# Patient Record
Sex: Female | Born: 1995 | Race: White | Hispanic: No | Marital: Married | Smoking: Former smoker
Health system: Southern US, Community
[De-identification: ages and names within clinical notes are randomized; demographics above are authoritative.]

## PROBLEM LIST (undated history)

## (undated) DIAGNOSIS — R519 Headache, unspecified: Secondary | ICD-10-CM

## (undated) DIAGNOSIS — T8859XA Other complications of anesthesia, initial encounter: Secondary | ICD-10-CM

## (undated) DIAGNOSIS — R112 Nausea with vomiting, unspecified: Secondary | ICD-10-CM

## (undated) DIAGNOSIS — Q5128 Other doubling of uterus, other specified: Secondary | ICD-10-CM

## (undated) DIAGNOSIS — Z833 Family history of diabetes mellitus: Secondary | ICD-10-CM

## (undated) DIAGNOSIS — D649 Anemia, unspecified: Secondary | ICD-10-CM

## (undated) DIAGNOSIS — K219 Gastro-esophageal reflux disease without esophagitis: Secondary | ICD-10-CM

## (undated) DIAGNOSIS — Q279 Congenital malformation of peripheral vascular system, unspecified: Secondary | ICD-10-CM

## (undated) DIAGNOSIS — Q512 Other doubling of uterus, unspecified: Principal | ICD-10-CM

## (undated) DIAGNOSIS — T4145XA Adverse effect of unspecified anesthetic, initial encounter: Secondary | ICD-10-CM

## (undated) DIAGNOSIS — Z9889 Other specified postprocedural states: Secondary | ICD-10-CM

## (undated) HISTORY — DX: Other and unspecified doubling of uterus: Q51.28

## (undated) HISTORY — DX: Other doubling of uterus, unspecified: Q51.20

## (undated) HISTORY — DX: Family history of diabetes mellitus: Z83.3

## (undated) HISTORY — DX: Congenital malformation of peripheral vascular system, unspecified: Q27.9

## (undated) HISTORY — PX: CHOLECYSTECTOMY: SHX55

---
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
## 2015-01-17 ENCOUNTER — Ambulatory Visit: Payer: Self-pay | Admitting: Primary Care

## 2015-01-21 ENCOUNTER — Ambulatory Visit: Payer: Self-pay | Admitting: Primary Care

## 2015-01-24 ENCOUNTER — Ambulatory Visit: Payer: Self-pay | Admitting: Primary Care

## 2015-04-27 ENCOUNTER — Telehealth: Payer: Self-pay | Admitting: Primary Care

## 2015-04-27 NOTE — Telephone Encounter (Signed)
Holly Meyer stated that if there is an appointment time 30 min slot that is available then ok to place the patient for both new patient and back pain.

## 2015-04-27 NOTE — Telephone Encounter (Signed)
Patient's mother called to schedule a new patient appointment.  Patient is having back pain.  First available new patient appointment is 05/10/15.  Patient's mother is asking if Anda Kraft can see patient sooner.

## 2015-04-28 ENCOUNTER — Ambulatory Visit (INDEPENDENT_AMBULATORY_CARE_PROVIDER_SITE_OTHER): Payer: 59 | Admitting: Primary Care

## 2015-04-28 ENCOUNTER — Encounter: Payer: Self-pay | Admitting: Primary Care

## 2015-04-28 VITALS — BP 110/76 | HR 80 | Temp 98.1°F | Ht 66.0 in | Wt 149.8 lb

## 2015-04-28 DIAGNOSIS — M549 Dorsalgia, unspecified: Secondary | ICD-10-CM | POA: Insufficient documentation

## 2015-04-28 DIAGNOSIS — M545 Low back pain, unspecified: Secondary | ICD-10-CM

## 2015-04-28 DIAGNOSIS — Z30011 Encounter for initial prescription of contraceptive pills: Secondary | ICD-10-CM

## 2015-04-28 DIAGNOSIS — M62830 Muscle spasm of back: Secondary | ICD-10-CM

## 2015-04-28 MED ORDER — CYCLOBENZAPRINE HCL 5 MG PO TABS: 5.0000 mg | ORAL_TABLET | Freq: Every day | ORAL | Status: DC

## 2015-04-28 MED ORDER — NORGESTIMATE-ETH ESTRADIOL 0.25-35 MG-MCG PO TABS: 1.0000 | ORAL_TABLET | Freq: Every day | ORAL | Status: DC

## 2015-04-28 NOTE — Assessment & Plan Note (Signed)
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 No numbness/tingling. Suspect MSK as she is tender over muscle area to right lower side. Some improvement with ibuprofen. RX today for flexeril at bedtime. Follow up if no improvement.

## 2015-04-28 NOTE — Assessment & Plan Note (Signed)
Managed on Tri-Sprintec. Experiencing headaches, breast pain, and nausea. Has been evaluated by GYN who did not find any abnormalities. Patient is not pregnant. She is requesting switch of BC. Started monophasic sprintec as this may improve symptoms. Periods regular. No extreme cramping. Follow up in 3 months for re-eval.

## 2015-04-28 NOTE — Patient Instructions (Signed)
Engineer, materials of Tri-Sprintec. Start the new pack of regular Sprintec the Sunday after you complete your period.  You may take the Flexeril at bedtime as needed for muscle spasms. Continue ibuprofen as needed. Apply heat to back for pain.  Follow up in 3 months for re-evaluation of birth control.  It was a pleasure to meet you today! Please don't hesitate to call me with any questions. Welcome to [facility]!

## 2015-04-28 NOTE — Progress Notes (Signed)
Pre visit review using our clinic review tool, if applicable. No additional management support is needed unless otherwise documented below in the visit note. 

## 2015-04-28 NOTE — Progress Notes (Signed)
Subjective:    Patient ID: Holly Meyer, female    DOB: 10/11/1996, 19 y.o.   MRN: [id number]  HPI  [name] is a 19 year old female who presents today to establish care and discuss the problems mentioned below. Will obtain old records.  1) Back pain: Started about 6 days ago. She started experiencing some neck stiffness that evening and then woke up the next morning with right lower back pain. She has a history of venous malformation to her spine on a nerve. Denies numbness/tingling. She had a massage 2 days ago with some temporarily relief. The massage therapist mentioned tension to her back. Overall her back pain has improved but still remains. She's been taking ibuprofen with some relief.   2) Birth control: She is requesting to be switched from the tri-sprintec to another birth control due to symptoms of headaches, nausea, and breast pain. Denies heavy cycles, not pregnant, no cramping.  Review of Systems  Constitutional: Negative for unexpected weight change.  HENT: Negative for rhinorrhea.   Respiratory: Negative for cough and shortness of breath.   Cardiovascular: Negative for chest pain.  Gastrointestinal: Negative for diarrhea and constipation.  Genitourinary: Negative for difficulty urinating.       Menstrual cycles regular.  Musculoskeletal: Positive for back pain.  Skin: Negative for rash.  Neurological: Positive for headaches.  Psychiatric/Behavioral:       Denies concerns for anxiety or depression       History reviewed. No pertinent past medical history.  History   Social History  . Marital Status: Single    Spouse Name: N/A  . Number of Children: N/A  . Years of Education: N/A   Occupational History  . Not on file.   Social History Main Topics  . Smoking status: Current Every Day Smoker -- 0.25 packs/day    Types: Cigarettes  . Smokeless tobacco: Not on file  . Alcohol Use: 0.0 oz/week    0 Standard drinks or equivalent per week     Comment: socailly    . Drug Use: Not on file  . Sexual Activity: Not on file   Other Topics Concern  . Not on file   Social History Narrative   Single.   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   Works at [employer].    Enjoys swimming, watching movies.    Past Surgical History  Procedure Laterality Date  . Wisdom tooth extraction  2012    Family History  Problem Relation Age of Onset  . Hypertension Father     Allergies  Allergen Reactions  . Fexofenadine Shortness Of Breath  . Guaifenesin Er Other (See Comments)  . Meloxicam Other (See Comments)    No current outpatient prescriptions on file prior to visit.   No current facility-administered medications on file prior to visit.    BP 110/76 mmHg  Pulse 80  Temp(Src) 98.1 F (36.7 C) (Oral)  Ht 5\' 6"  (1.676 m)  Wt 149 lb 12.8 oz (67.949 kg)  BMI 24.19 kg/m2  SpO2 98%  LMP 04/06/2015    Objective:   Physical Exam  Constitutional: She is oriented to person, place, and time. She appears well-nourished.  HENT:  Head: Normocephalic.  Cardiovascular: Normal rate and regular rhythm.   Pulmonary/Chest: Effort normal and breath sounds normal.  Musculoskeletal: Normal range of motion.  Tenderness to right lower back over muscle region. No spinal tenderness. No radiculopathy. Neg straight  leg raise.  Neurological: She is alert and oriented to person, place, and time.  Skin: Skin is warm and dry.  Psychiatric: She has a normal mood and affect.          Assessment & Plan:

## 2015-05-09 ENCOUNTER — Ambulatory Visit: Payer: Self-pay | Admitting: Primary Care

## 2015-05-10 ENCOUNTER — Ambulatory Visit: Payer: Self-pay | Admitting: Primary Care

## 2015-06-22 ENCOUNTER — Encounter: Payer: Self-pay | Admitting: Internal Medicine

## 2015-06-22 ENCOUNTER — Ambulatory Visit (INDEPENDENT_AMBULATORY_CARE_PROVIDER_SITE_OTHER): Payer: 59 | Admitting: Internal Medicine

## 2015-06-22 VITALS — BP 120/78 | HR 91 | Temp 99.0°F | Wt 150.0 lb

## 2015-06-22 DIAGNOSIS — R102 Pelvic and perineal pain: Secondary | ICD-10-CM

## 2015-06-22 DIAGNOSIS — R519 Headache, unspecified: Secondary | ICD-10-CM

## 2015-06-22 DIAGNOSIS — N9489 Other specified conditions associated with female genital organs and menstrual cycle: Secondary | ICD-10-CM

## 2015-06-22 DIAGNOSIS — S90464A Insect bite (nonvenomous), right lesser toe(s), initial encounter: Secondary | ICD-10-CM | POA: Diagnosis not present

## 2015-06-22 DIAGNOSIS — R51 Headache: Secondary | ICD-10-CM

## 2015-06-22 DIAGNOSIS — J309 Allergic rhinitis, unspecified: Secondary | ICD-10-CM | POA: Diagnosis not present

## 2015-06-22 DIAGNOSIS — W57XXXA Bitten or stung by nonvenomous insect and other nonvenomous arthropods, initial encounter: Secondary | ICD-10-CM

## 2015-06-22 NOTE — Patient Instructions (Signed)

## 2015-06-22 NOTE — Progress Notes (Signed)
Pre visit review using our clinic review tool, if applicable. No additional management support is needed unless otherwise documented below in the visit note. 

## 2015-06-22 NOTE — Progress Notes (Signed)
HPI  Pt presents to the clinic today with c/o ear pain, sore throat and cough. This started 1 week ago. She feels like her glands are swollen. She is not blowing anything out of her nose. Her cough is not productive. She denies fever, chills or body aches. She has taken Tylenol without any relief. She has no history of seasonal allergies. She has had sick contacts diagnosed with bronchitis.  She also reports she has a migraine. It is located in her forehead above her eyes. She describes the pain as throbbing. She denies blurred vision or dizziness. She denies sensitivity to light or sound. She does have a history of migraines and normally she can take Ibuprofen or Tylenol with good relief but it has not helped this headache.  She also c/o a bite to her left second toe. This occurred 2 days ago. It does itch a little bit. She has not noticed any rashes, warmth of pain. She has not noticed any drainage from the area. She has not tried anything OTC.  She also c/o pelvic pressure. This started just after her menstrual period on 8/27. She denies cramping. She does report that her period was lighter than usual. She denies urinary or vaginal complaints. She just wants to make sure that this is normal after switching birth control.  Review of Systems     No past medical history on file.  Family History  Problem Relation Age of Onset  . Hypertension Father     Social History   Social History  . Marital Status: Single    Spouse Name: N/A  . Number of Children: N/A  . Years of Education: N/A   Occupational History  . Not on file.   Social History Main Topics  . Smoking status: Former Smoker -- 0.25 packs/day    Types: Cigarettes  . Smokeless tobacco: Never Used     Comment: recently quit  . Alcohol Use: 0.0 oz/week    0 Standard drinks or equivalent per week     Comment: socailly  . Drug Use: Not on file  . Sexual Activity: Not on file   Other Topics Concern  . Not on file   Social  History Narrative   Single.   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   Works at [employer].    Enjoys swimming, watching movies.    Allergies  Allergen Reactions  . Fexofenadine Shortness Of Breath  . Guaifenesin Er Other (See Comments)  . Meloxicam Other (See Comments)     Constitutional: Positive headache. Denies fatigue, fever or abrupt weight changes.  HEENT:  Positive ear pain, sore throat. Denies eye redness, eye pain, pressure behind the eyes, facial pain, nasal congestion, ringing in the ears, wax buildup, runny nose or bloody nose. Respiratory: Positive cough. Denies difficulty breathing or shortness of breath.  Cardiovascular: Denies chest pain, chest tightness, palpitations or swelling in the hands or feet.  GI: Positive abdominal pain. Denies diarrhea, constipation or blood in stool. GU: Denies urgency, frequency, dysuria, blood in urine, vaginal discharge or odor. Skin: Pt reports bite to toe. Denies rashes or ulcerations.  No other specific complaints in a complete review of systems (except as listed in HPI above).  Objective:   BP 120/78 mmHg  Pulse 91  Temp(Src) 99 F (37.2 C) (Oral)  Wt 150 lb (68.04 kg)  SpO2 98%  LMP 06/11/2015 Wt Readings from Last 3 Encounters:  06/22/15 150  lb (68.04 kg)  04/28/15 149 lb 12.8 oz (67.949 kg)     General: Appears herstated age, well developed, well nourished in NAD. HEENT: Head: normal shape and size, no sinus tenderness noted; Eyes: sclera white, no icterus, conjunctiva pink; Ears: bilateral cerumen impaction; Nose: mucosa pink and moist, septum midline; Throat/Mouth: + PND. Teeth present, mucosa pink and moist, no exudate noted, no lesions or ulcerations noted.  Neck: No cervical lymphadenopathy.  Cardiovascular: Normal rate and rhythm. S1,S2 noted.  No murmur, rubs or gallops noted.  Pulmonary/Chest: Normal effort and positive vesicular breath  sounds. No respiratory distress. No wheezes, rales or ronchi noted.  Abdomen: Soft, nontender, active bowel sounds. No distention or masses noted. Skin: Small lesion to right second toe. Appears to be a local reaction only. No infection or cellulitis noted.    Assessment & Plan:   Allergies:  Do salt water gargles for the sore throat Take Allegra daily x 1 week Return precautions given  Migraine:  I think this is precipitated by a sinus headache She will take Allegra daily x 1 week Advised her to try Ibuprofen 800 mg or Tylenol 1000 mg x 1 Return precautions given  Insect bite of toe with local reaction:  Try ice to the affected area Allegra should help with this as well Can try Benadryl to help decrease the swelling  Pelvic pressure:  Given other complaints, exam deferred today Discussed how your body has to get used to the hormonal changes of a new birth control If persists, follow up with PCP  RTC as needed or if symptoms persist or worsen RTC as needed or if symptoms persist.

## 2015-07-29 ENCOUNTER — Ambulatory Visit: Payer: 59 | Admitting: Primary Care

## 2015-07-29 DIAGNOSIS — Z0289 Encounter for other administrative examinations: Secondary | ICD-10-CM

## 2015-08-01 ENCOUNTER — Telehealth: Payer: Self-pay | Admitting: Primary Care

## 2015-08-01 NOTE — Telephone Encounter (Signed)
No immediate follow up necessary. Thanks.

## 2015-08-01 NOTE — Telephone Encounter (Signed)
Pt did not come in for their appt on 07/29/15 for follow up. Please let me know if pt needs to be contacted immediately for follow up or no follow up needed. Best phone number to contact pt is [phone].

## 2015-08-02 ENCOUNTER — Other Ambulatory Visit: Payer: Self-pay | Admitting: Primary Care

## 2015-08-02 ENCOUNTER — Encounter: Payer: Self-pay | Admitting: Primary Care

## 2015-08-02 ENCOUNTER — Ambulatory Visit (INDEPENDENT_AMBULATORY_CARE_PROVIDER_SITE_OTHER): Payer: 59 | Admitting: Primary Care

## 2015-08-02 VITALS — BP 132/88 | HR 94 | Temp 97.4°F | Ht 66.0 in | Wt 148.4 lb

## 2015-08-02 DIAGNOSIS — M545 Low back pain, unspecified: Secondary | ICD-10-CM

## 2015-08-02 DIAGNOSIS — Z30011 Encounter for initial prescription of contraceptive pills: Secondary | ICD-10-CM

## 2015-08-02 DIAGNOSIS — Q279 Congenital malformation of peripheral vascular system, unspecified: Secondary | ICD-10-CM

## 2015-08-02 DIAGNOSIS — G43901 Migraine, unspecified, not intractable, with status migrainosus: Secondary | ICD-10-CM | POA: Diagnosis not present

## 2015-08-02 DIAGNOSIS — G43909 Migraine, unspecified, not intractable, without status migrainosus: Secondary | ICD-10-CM | POA: Insufficient documentation

## 2015-08-02 DIAGNOSIS — R1030 Lower abdominal pain, unspecified: Secondary | ICD-10-CM

## 2015-08-02 DIAGNOSIS — Q278 Other specified congenital malformations of peripheral vascular system: Secondary | ICD-10-CM | POA: Diagnosis not present

## 2015-08-02 MED ORDER — SUMATRIPTAN SUCCINATE 50 MG PO TABS: ORAL_TABLET | ORAL | Status: DC

## 2015-08-02 NOTE — Progress Notes (Signed)
   Subjective:    Patient ID: Holly Meyer, female    DOB: June 19, 1996, 19 y.o.   MRN: [id number]  HPI  [name] is a 19 year old female who presents today for follow up.  1) Back pain: History of venous malformation near spine found in 2011. She had been obtaining annual MRI's through [facility] but has not had one since 2014. She was evaluated in July 2016 for the same and has had overall imrpovement with PRN ibuprofen and Flexeril. She was provided with flexeril at bedtime and told to follow up if no improvement. 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 She wears comfortable shoes. Denies numbness/tingling. Her discomfort is mainly stiffness as she will have to stretch out her lower back for improvement. Overall she's had an improvement in her pain.  2) Headaches: She was evaluated in July 2016 with complaints of headaches, breast pain, and nausea since initation of tri-phasic OCP's per GYN. She underwent evaluation by GYN for similar symptoms and had a negative work up. She was switched to monophasic OCP's last visit. Since switching her OCP's she feels better overall. Her headaches are less frequent. She did have a headache that began as a migraine last Sunday and progressed throughout the week. She took 800 mg of ibuprofen every other day which eventually helped to relieve. She does not wear her glasses daily as she was instructed to do so. Denies dizziness.  3) Abdominal pain: Present to bilateral lower quadrants (suprapubic) intermittently since July 2016. She had a reduction in symptoms overall since changing OCP's. Last night she developed bilateral lower quadrant/groin pain with nausea. She denies vaginal discharge, itching, tenderness, urinary symptoms, fevers, chills. Her LMP completed Sunday October 9th.   Review of Systems  Constitutional: Negative for fever  and chills.  Gastrointestinal: Positive for nausea and abdominal pain. Negative for vomiting, diarrhea and constipation.  Genitourinary: Negative for dysuria, urgency, frequency, flank pain, vaginal discharge and vaginal pain.  Musculoskeletal: Positive for back pain.  Neurological: Positive for headaches. Negative for dizziness.       Objective:   Physical Exam  Constitutional: She appears well-nourished.  Cardiovascular: Normal rate.   Pulmonary/Chest: Effort normal and breath sounds normal.  Abdominal: Soft. Bowel sounds are normal. There is tenderness in the suprapubic area. There is no guarding, no tenderness at McBurney's point and negative Murphy's sign.    Musculoskeletal: Normal range of motion. She exhibits no tenderness.  Skin: Skin is warm and dry.          Assessment & Plan:  Abdominal/Groin pain:  Continues to experience lower cramping/pain, especially last night with nausea. Would like to obtain pelvic ultrasound to rule out ovarian involvement as GYN as already done some work up on this already.  [place] pending. If US WNL will consider sending back to GYN for further evaluation.

## 2015-08-02 NOTE — Assessment & Plan Note (Signed)
Improved overall with ibuprofen and flexeril PRN. History of venous malformation and has not had MRI in 2 years. MRI ordered today for evaluation of venous malformation. Exam unremarkable.

## 2015-08-02 NOTE — Assessment & Plan Note (Signed)
Family history of migraines and frequent headaches. Headaches are infrequent but did have migraine last week that lasted throughout the week. Discussed ibuprofen and tylenol use. RX for imitrex sent to pharmacy to use PRN. Exam unremarkable today.

## 2015-08-02 NOTE — Assessment & Plan Note (Signed)
Overall improvement in cramping and cycles. Continues to experience lower cramping/pain, especially last night with nausea. Would like to obtain pelvic ultrasound to rule out ovarian involvement as GYN as already done some work up on this already. [place] pending.

## 2015-08-02 NOTE — Progress Notes (Signed)
Pre visit review using our clinic review tool, if applicable. No additional management support is needed unless otherwise documented below in the visit note. 

## 2015-08-02 NOTE — Patient Instructions (Addendum)
You may take 800 mg of ibuprofen three times daily as needed for headaches. If the 800 mg of ibuprofen does not improve the headache, try 1000 mg of tylenol instead. Do not exceed 2400 mg of ibuprofen or 3000 mg of tylenol daily.  You may take Imitrex as needed for a migraine. Take 1 tablet at onset of migraine. You may repeat with another tablet 2 hours later if no improvement in migraine. Caution as this medication will make you drowsy. Please use this medication for migraines only.   You may take 1/2 tablet of the flexeril at bedtime as needed for back pain.  Stop by the front and speak with [facility] regarding your Ultrasound and MRI. I will call you once I receive these results.  It was a pleasure to see you today!

## 2015-08-05 ENCOUNTER — Other Ambulatory Visit: Payer: Self-pay | Admitting: Primary Care

## 2015-08-05 ENCOUNTER — Ambulatory Visit
Admission: RE | Admit: 2015-08-05 | Discharge: 2015-08-05 | Disposition: A | Discharge status: Home / Self Care | Payer: 59 | Source: Ambulatory Visit | Attending: Primary Care | Admitting: Primary Care

## 2015-08-05 DIAGNOSIS — Q512 Other doubling of uterus, unspecified: Principal | ICD-10-CM

## 2015-08-05 DIAGNOSIS — R1030 Lower abdominal pain, unspecified: Secondary | ICD-10-CM

## 2015-08-05 DIAGNOSIS — Q5128 Other doubling of uterus, other specified: Secondary | ICD-10-CM

## 2015-08-08 ENCOUNTER — Telehealth: Payer: Self-pay | Admitting: Primary Care

## 2015-08-08 ENCOUNTER — Ambulatory Visit: Payer: 59 | Admitting: Primary Care

## 2015-08-08 NOTE — Telephone Encounter (Signed)
Pt returned your call.  

## 2015-08-11 ENCOUNTER — Ambulatory Visit: Payer: 59

## 2015-08-11 ENCOUNTER — Encounter: Payer: Self-pay | Admitting: Obstetrics and Gynecology

## 2015-08-16 ENCOUNTER — Encounter: Payer: Self-pay | Admitting: Primary Care

## 2015-09-05 ENCOUNTER — Ambulatory Visit
Admission: RE | Admit: 2015-09-05 | Discharge: 2015-09-05 | Disposition: A | Discharge status: Home / Self Care | Payer: 59 | Source: Ambulatory Visit | Attending: Primary Care | Admitting: Primary Care

## 2015-09-05 DIAGNOSIS — Q279 Congenital malformation of peripheral vascular system, unspecified: Secondary | ICD-10-CM

## 2015-09-05 DIAGNOSIS — Q278 Other specified congenital malformations of peripheral vascular system: Secondary | ICD-10-CM | POA: Diagnosis not present

## 2015-09-05 DIAGNOSIS — M259 Joint disorder, unspecified: Secondary | ICD-10-CM | POA: Diagnosis not present

## 2015-09-20 ENCOUNTER — Ambulatory Visit (INDEPENDENT_AMBULATORY_CARE_PROVIDER_SITE_OTHER): Payer: 59 | Admitting: Obstetrics and Gynecology

## 2015-09-20 ENCOUNTER — Encounter: Payer: Self-pay | Admitting: Obstetrics and Gynecology

## 2015-09-20 VITALS — BP 133/90 | HR 84 | Ht 66.0 in | Wt 149.4 lb

## 2015-09-20 DIAGNOSIS — Q512 Other doubling of uterus, unspecified: Principal | ICD-10-CM

## 2015-09-20 DIAGNOSIS — Q5128 Other doubling of uterus, other specified: Secondary | ICD-10-CM

## 2015-09-20 NOTE — Progress Notes (Signed)
GYNECOLOGY PROGRESS NOTE  Subjective:    Patient ID: Holly Meyer, female    DOB: 10/09/1996, 19 y.o.   MRN: [id number]  HPI  Patient is a 19 y.o. G0P0000 female who presents as a referral from [facility] for ultrasound findings of possible uterine septum.  Patient notes that she had initially complained of new onset pelvic cramping and pian (mild-moderate), which led to PCP obtaining imaging study. Currently denies complaints.    GYN History:   Notes regular occuring menses, lasts 5 days.  Currently on OCPs (switched brands ~ 3-4 months ago, notes periods are lasting slightly longer).  Denies h/o STI's, does not have a pap  Hisotry.    OB History  Gravida Para Term Preterm AB SAB TAB Ectopic Multiple Living  0 0 0 0 0 0 0 0 0 0         Family History  Problem Relation Age of Onset  . Hypertension Father     Past Surgical History  Procedure Laterality Date  . Wisdom tooth extraction  2012    Social History   Social History  . Marital Status: Single    Spouse Name: N/A  . Number of Children: N/A  . Years of Education: N/A   Occupational History  . Not on file.   Social History Main Topics  . Smoking status: Former Smoker -- 0.25 packs/day    Types: Cigarettes  . Smokeless tobacco: Never Used     Comment: recently quit  . Alcohol Use: 0.0 oz/week    0 Standard drinks or equivalent per week     Comment: socailly  . Drug Use: No  . Sexual Activity: Yes    Birth Control/ Protection: Pill   Other Topics Concern  . Not on file   Social History Narrative   Single.   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   Works at [employer].    Enjoys swimming, watching movies.    Allergies  Allergen Reactions  . Fexofenadine Shortness Of Breath  . Guaifenesin Er Other (See Comments)  . Meloxicam Other (See Comments)  . Naproxen Other (See Comments)    Loss of appiteite  . Zyrtec  [Cetirizine]      Current Outpatient Prescriptions on File Prior to Visit  Medication Sig Dispense Refill  . cyclobenzaprine (FLEXERIL) 5 MG tablet Take 1 tablet (5 mg total) by mouth at bedtime. 15 tablet 0  . norgestimate-ethinyl estradiol (ORTHO-CYCLEN,SPRINTEC,PREVIFEM) 0.25-35 MG-MCG tablet Take 1 tablet by mouth daily. 1 Package 11  . SUMAtriptan (IMITREX) 50 MG tablet Take 1 tablet by mouth at onset of migraine. You may take 1 additional tablet in 2 hours if no relief. Do not exceed 2 tablets in 24 hours. 10 tablet 0   No current facility-administered medications on file prior to visit.     Review of Systems Pertinent items noted in HPI and remainder of comprehensive ROS otherwise negative.   Objective:   Blood pressure 133/90, pulse 84, height 5\' 6"  (1.676 m), weight 149 lb 6.4 oz (67.767 kg), last menstrual period 08/07/2015. General appearance: alert and no distress Exam deferred.    Imaging:   FINDINGS: Uterus - Measurements: 7.1 x 3.2 x 5.2 cm. No fibroids or other mass visualized.  Endometrium - Thickness: 5 mm. No focal endometrial mass. Limited axial imaging suggests a septated uterus.  Right ovary - Measurements: 3.1 x 1.7 x 1.9 cm. Normal appearance/no adnexal mass.  Left ovary - Measurements: 1.9 x 2.1 x 2.6 cm. Normal appearance/no adnexal mass.  Other findings - No free fluid.  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 MRI pelvis can be performed to further delineate.  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 Discussed the origin as it is a type of congenital anomaly (failure of reabsorption of septum during embryogenesis). Informed patient that septate uteri usually do not cause problems or complications until childbearing is desired, in which case she would be at a higher risk for spontaeous and/or recurrent miscarriages (although not all women with a  septate uterus have difficulties with conception). Would ld likely need resection depending on size and involvement of septum.  Also recommend f/u with MRI to further delineate (as could also be other forms of Mullerian agenesis such as uterine didelphys). Will schedule and have patient return in 2-3 weeks for follow up for further discussion.   All questions answered. 2) Pelvic pain - unlikely caused by uterine septum as her pain has a more recent onset and patient has had septum since birth. May be due to new OCPs, as patient has noted changes in her menstrual cycle over last several months as well (used to be 3 days, now 5 days; slightly heavier).  Discussed use of OTC NSAIDs for management.     A total of 30 minutes were spent face-to-face with the patient during this encounter and over half of that time dealt with counseling and coordination of care.   Rubie Maid, MD [facility] 09/20/2015 8:53 PM

## 2015-10-11 ENCOUNTER — Ambulatory Visit: Payer: 59 | Admitting: Obstetrics and Gynecology

## 2015-10-12 ENCOUNTER — Ambulatory Visit: Admission: RE | Admit: 2015-10-12 | Payer: 59 | Source: Ambulatory Visit

## 2015-10-13 ENCOUNTER — Ambulatory Visit: Payer: 59 | Admitting: Obstetrics and Gynecology

## 2015-12-07 ENCOUNTER — Ambulatory Visit
Admission: RE | Admit: 2015-12-07 | Discharge: 2015-12-07 | Disposition: A | Discharge status: Home / Self Care | Payer: BLUE CROSS/BLUE SHIELD | Source: Ambulatory Visit | Attending: Obstetrics and Gynecology | Admitting: Obstetrics and Gynecology

## 2015-12-07 ENCOUNTER — Other Ambulatory Visit: Payer: Self-pay | Admitting: Obstetrics and Gynecology

## 2015-12-07 DIAGNOSIS — Q51810 Arcuate uterus: Secondary | ICD-10-CM | POA: Diagnosis not present

## 2015-12-07 DIAGNOSIS — Q512 Other doubling of uterus, unspecified: Principal | ICD-10-CM

## 2015-12-07 DIAGNOSIS — Q5128 Other doubling of uterus, other specified: Secondary | ICD-10-CM

## 2015-12-12 ENCOUNTER — Telehealth: Payer: Self-pay | Admitting: Obstetrics and Gynecology

## 2015-12-12 NOTE — Telephone Encounter (Signed)
Attempted to contact patient regarding MRI results.  Left message on voicemail for patient to return call to office.     Rubie Maid, MD [facility]

## 2015-12-13 ENCOUNTER — Ambulatory Visit (INDEPENDENT_AMBULATORY_CARE_PROVIDER_SITE_OTHER): Payer: BLUE CROSS/BLUE SHIELD | Admitting: Obstetrics and Gynecology

## 2015-12-13 ENCOUNTER — Encounter: Payer: Self-pay | Admitting: Obstetrics and Gynecology

## 2015-12-13 VITALS — BP 124/80 | HR 69 | Ht 66.0 in | Wt 150.8 lb

## 2015-12-13 DIAGNOSIS — Q51810 Arcuate uterus: Secondary | ICD-10-CM

## 2015-12-13 DIAGNOSIS — Z3041 Encounter for surveillance of contraceptive pills: Secondary | ICD-10-CM

## 2015-12-13 MED ORDER — NORETHIN ACE-ETH ESTRAD-FE 1-20 MG-MCG PO TABS: 1.0000 | ORAL_TABLET | Freq: Every day | ORAL | Status: DC

## 2015-12-14 NOTE — Progress Notes (Signed)
    GYNECOLOGY PROGRESS NOTE  Subjective:    Patient ID: Holly Meyer, female    DOB: Oct 16, 1995, 20 y.o.   MRN: [id number]  HPI  Patient is a 20 y.o. G44P0000 female who presents for follow up for MRI results for suspected septate uterus noted on ultrasound, and contraception.    Patient notes that the [place] caused nausea and vomiting, could not tolerate, has been of x 2 months.  Has been using condoms since that time.  Desires to switch to a lower dose pill.   The following portions of the patient's history were reviewed and updated as appropriate: allergies, current medications, past family history, past medical history, past social history, past surgical history and problem list.  Review of Systems Pertinent items noted in HPI and remainder of comprehensive ROS otherwise negative.   Objective:   Blood pressure 124/80, pulse 69, height 5\' 6"  (1.676 m), weight 150 lb 12.8 oz (68.402 kg), last menstrual period 10/19/2015. General appearance: alert and no distress Exam deferred.   Imaging:  Pelvic Ultrasound (08/05/2015)-  FINDINGS: Uterus  Measurements: 7.1 x 3.2 x 5.2 cm. No fibroids or other mass visualized.  Endometrium Thickness: 5 mm. No focal endometrial mass. Limited axial imaging suggests a septated uterus.  Right ovary Measurements: 3.1 x 1.7 x 1.9 cm. Normal appearance/no adnexal mass.  Left ovary Measurements: 1.9 x 2.1 x 2.6 cm. Normal appearance/no adnexal mass.  Other findings: No free fluid.  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 MRI pelvis can be performed to further delineate.   MRI Pelvis 12/07/2015-  FINDINGS: Uterus: The uterus is anteverted. Endometrium is normal thickness at 3 mm. The junctional zone is normal thickness at 4 mm.  Fundus of the uterus relatively smooth without significant indentation. There is an indentation in fundal border of the endometrium which measures approximately 5 mm. The total fundal thickness myometrium  thickness 10 mm. 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 There is single endometrial canal.  Right adnexa: Multiple small round follicles of normal size within the ovary which measures 2.2 by a 2.1 by 2.6 cm  Left adnexa: Multiple prominent ovarian follicles which are normal size. Number follicles measured as estimated approximately 15 in each ovary. LEFT ovary measures 27 x 23 by 28 mm for normal volume.  Bladder/urethra: Normal  Nodes: No adenopathy  Bowel: Normal rectosigmoid colon  IMPRESSION: 1. Arcuate uterus. 2. Multiple follicles of normal size within each ovary are favored within normal limits.  Assessment:   1. Arcuate uterus 2. Contraception management  Plan:   1.  Discussion had on arcuate uterus based on MRI (not septate as suggested by ultrasound).  Discussed that patient should likely not have any symptoms, and should not affect fertility. No further workup or management required at this time.  2. Contraception - changed from [place] to [place].  Advised on taking at night to decrease side effects of nausea/vomiting. Patient to begin on Sunday start after next menses.  3. To follow up as needed.     A total of 15 minutes were spent face-to-face with the patient during this encounter and over half of that time dealt with counseling and coordination of care.   Rubie Maid, MD [facility]

## 2016-02-28 ENCOUNTER — Ambulatory Visit (INDEPENDENT_AMBULATORY_CARE_PROVIDER_SITE_OTHER): Payer: BLUE CROSS/BLUE SHIELD | Admitting: Primary Care

## 2016-02-28 ENCOUNTER — Encounter: Payer: Self-pay | Admitting: Primary Care

## 2016-02-28 VITALS — BP 114/70 | HR 76 | Temp 98.1°F | Ht 66.0 in | Wt 154.4 lb

## 2016-02-28 DIAGNOSIS — G43901 Migraine, unspecified, not intractable, with status migrainosus: Secondary | ICD-10-CM | POA: Diagnosis not present

## 2016-02-28 DIAGNOSIS — J309 Allergic rhinitis, unspecified: Secondary | ICD-10-CM | POA: Insufficient documentation

## 2016-02-28 DIAGNOSIS — J302 Other seasonal allergic rhinitis: Secondary | ICD-10-CM

## 2016-02-28 NOTE — Assessment & Plan Note (Signed)
Long history of since high school. No improvement on Allegra and Zyrtec. Will have her try Xyzal as this is now OTC. Will consider Singulair if no improvement. She is to update me in 2-3 weeks.

## 2016-02-28 NOTE — Progress Notes (Signed)
Subjective:    Patient ID: Holly Meyer, female    DOB: 19-Dec-1995, 20 y.o.   MRN: [id number]  HPI  [name] is a 20 year old female who presents today with multiple complaints.  1) Headaches: History of headaches in the past and takes Imitrex occasionally for bad migraines. She reports an increase in anxiety and stress for the past 2 months due to various situations (moving, work, relationships). She's been working to reduce her anxiety/stress with running and listening to music which helps.  GAD 7 score of 4 today. Her headaches are located to the bilateral temporal region. She's used her Imitrex 4-5 times since her last visit. Her headaches occur twice weekly. She will typically rest/lay down with improvement. She will occasionally take ibuprofen/tylenol for headaches if she's at work. Denies daily worry, anxiety, insomnia.  2) Allergic Rhinitis: Present since senior year in high school. She will experience nasal congestion, itchy/watery eyes, shortness of breath, sneezing. Once managed on Allegra and Zyrtec in the past without improvement. She's recently tried Hydroxyzine from her father's prescription with improvement, however, this caused drowsiness. She was once treated with a prescription strength medication in the past but cannot remember.  Review of Systems  Constitutional: Negative for fever.  HENT: Positive for congestion, rhinorrhea and sneezing.   Eyes: Positive for itching.  Respiratory: Negative for cough, shortness of breath and wheezing.   Cardiovascular: Negative for chest pain.  Allergic/Immunologic: Positive for environmental allergies.  Neurological: Positive for headaches.  Psychiatric/Behavioral: Negative for suicidal ideas and sleep disturbance. The patient is nervous/anxious.        Past Medical History  Diagnosis Date  . Venous malformation     Right paraspinal  . Septate uterus      Social History   Social History  . Marital Status: Single    Spouse  Name: N/A  . Number of Children: N/A  . Years of Education: N/A   Occupational History  . Not on file.   Social History Main Topics  . Smoking status: Former Smoker -- 0.25 packs/day    Types: Cigarettes  . Smokeless tobacco: Never Used     Comment: recently quit  . Alcohol Use: 0.0 oz/week    0 Standard drinks or equivalent per week     Comment: socailly  . Drug Use: No  . Sexual Activity: Yes    Birth Control/ Protection: None   Other Topics Concern  . Not on file   Social History Narrative   Single.   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   Works at [employer].    Enjoys swimming, watching movies.    Past Surgical History  Procedure Laterality Date  . Wisdom tooth extraction  2012    Family History  Problem Relation Age of Onset  . Hypertension Father     Allergies  Allergen Reactions  . Fexofenadine Shortness Of Breath    Other reaction(s): Respiratory Distress (ALLERGY/intolerance)  . Cetirizine Hcl   . Guaifenesin     Other reaction(s): Dizziness (intolerance)  . Guaifenesin Er Other (See Comments)  . Meloxicam Other (See Comments)  . Naproxen Other (See Comments)    Loss of appiteite  . Zyrtec  [Cetirizine]     Current Outpatient Prescriptions on File Prior to Visit  Medication Sig Dispense Refill  . norethindrone-ethinyl estradiol (JUNEL FE,GILDESS FE,LOESTRIN FE) 1-20 MG-MCG tablet Take 1 tablet by mouth daily. 1 Package 11  .  SUMAtriptan (IMITREX) 50 MG tablet Take 1 tablet by mouth at onset of migraine. You may take 1 additional tablet in 2 hours if no relief. Do not exceed 2 tablets in 24 hours. 10 tablet 0   No current facility-administered medications on file prior to visit.    BP 114/70 mmHg  Pulse 76  Temp(Src) 98.1 F (36.7 C) (Oral)  Ht 5\' 6"  (1.676 m)  Wt 154 lb 6.4 oz (70.035 kg)  BMI 24.93 kg/m2  SpO2 99%  LMP 01/31/2016    Objective:   Physical Exam    Constitutional: She appears well-nourished.  HENT:  Right Ear: Tympanic membrane and ear canal normal.  Left Ear: Tympanic membrane and ear canal normal.  Nose: Right sinus exhibits no maxillary sinus tenderness and no frontal sinus tenderness. Left sinus exhibits no maxillary sinus tenderness and no frontal sinus tenderness.  Mouth/Throat: Oropharynx is clear and moist.  Eyes: Conjunctivae are normal. Pupils are equal, round, and reactive to light.  Neck: Neck supple.  Cardiovascular: Normal rate and regular rhythm.   Pulmonary/Chest: Effort normal and breath sounds normal. She has no wheezes. She has no rales.  Lymphadenopathy:    She has no cervical adenopathy.  Neurological: No cranial nerve deficit.  Skin: Skin is warm and dry.          Assessment & Plan:

## 2016-02-28 NOTE — Patient Instructions (Signed)
Find outlets for your stress as discussed today. It's important to spend time on yourself daily.  Please notify me if: No improvement in your stress/anxiety levels in the next several months You start to experience panic attacks Your headaches do not improve despite improvement in anxiety/stress levels  Start taking Xyzal for allergies. This was a prescription that is now available over the counter.   Nasal Congestion: Try using Flonase (fluticasone) nasal spray. Instill 2 sprays in each nostril once daily.   Please notify me if no improvement in allergy symptoms in the next 2 weeks.  It was a pleasure to see you today!

## 2016-02-28 NOTE — Assessment & Plan Note (Addendum)
Increase in headaches over past 2 months likely due to increased stress in personal life. Discussed techniques to manage stress/anxiety to prevent headaches as this is likely the cause. Will have her continue on her current regimen for now and continue to monitor headaches. She is using tylenol and ibuprofen sparingly.  Does not need refills of Imitrex now. Strong FH of headaches and migraines. Return precautions provided.

## 2016-02-28 NOTE — Progress Notes (Signed)
Pre visit review using our clinic review tool, if applicable. No additional management support is needed unless otherwise documented below in the visit note. 

## 2016-07-11 ENCOUNTER — Encounter: Payer: Self-pay | Admitting: Primary Care

## 2016-07-11 ENCOUNTER — Other Ambulatory Visit: Payer: Self-pay | Admitting: Primary Care

## 2016-07-11 DIAGNOSIS — G43901 Migraine, unspecified, not intractable, with status migrainosus: Secondary | ICD-10-CM

## 2016-07-11 MED ORDER — SUMATRIPTAN SUCCINATE 50 MG PO TABS: ORAL_TABLET | ORAL | 1 refills | Status: DC

## 2016-07-16 ENCOUNTER — Telehealth: Payer: Self-pay | Admitting: Primary Care

## 2016-07-16 NOTE — Telephone Encounter (Signed)
Please call and check on patient. Has she ever experienced a reaction to Imitrex in the past?

## 2016-07-16 NOTE — Telephone Encounter (Signed)
Patient Name: Holly Meyer DOB: 09-08-1996 Initial Comment Caller states she has a horrible headache, jaw line is tight. Trouble breathing. Nurse Assessment Nurse: Ronnald Ramp, RN, Miranda Date/Time (Eastern Time): 07/16/2016 2:19:07 PM Confirm and document reason for call. If symptomatic, describe symptoms. You must click the next button to save text entered. ---Caller states about 15 min after taking Imitrex she started having tightness in her jaw and trouble breathing, also her headache increased. Has the patient traveled out of the country within the last 30 days? ---Not Applicable Does the patient have any new or worsening symptoms? ---Yes Will a triage be completed? ---Yes Related visit to physician within the last 2 weeks? ---No Does the PT have any chronic conditions? (i.e. diabetes, asthma, etc.) ---Yes List chronic conditions. ---Headache, Back pain Is the patient pregnant or possibly pregnant? (Ask all females between the ages of 69-55) ---No Is this a behavioral health or substance abuse call? ---No Guidelines Guideline Title Affirmed Question Affirmed Notes Face Pain Difficulty breathing or unusual sweating (e.g., sweating without exertion) Final Disposition User Go to ED Now Ronnald Ramp, RN, Miranda Comments Jaw pain, chest tightness, and trouble breathing are listed as adverse reactions to Imitrex that need immediate evaluation. [email or web address] Referrals GO TO FACILITY UNDECIDED Disagree/Comply: Comply

## 2016-07-16 NOTE — Telephone Encounter (Signed)
Please advise thanks.

## 2016-07-17 NOTE — Telephone Encounter (Signed)
Message left for patient to return my call.  

## 2016-07-18 NOTE — Telephone Encounter (Signed)
Noted  

## 2016-07-18 NOTE — Telephone Encounter (Signed)
Spoken to patient and she stated that she never had a reaction to Imitrex in the past. This was the first time that she had a headache with jaw line pain.  Patient stated that she did went to urgent care because she could not get off work yesterday to come here.  Patient stated that she is much better now and will let Anda Kraft know if this happen again.

## 2016-07-18 NOTE — Telephone Encounter (Signed)
Pt returned call.  Thanks

## 2016-07-18 NOTE — Telephone Encounter (Signed)
Message left for patient's mom for patient to return my call.

## 2016-09-18 ENCOUNTER — Encounter: Payer: Self-pay | Admitting: Primary Care

## 2016-09-18 ENCOUNTER — Telehealth: Payer: Self-pay | Admitting: Primary Care

## 2016-09-18 ENCOUNTER — Encounter: Payer: Self-pay | Admitting: Obstetrics and Gynecology

## 2016-09-18 NOTE — Telephone Encounter (Signed)
error 

## 2016-12-17 ENCOUNTER — Encounter: Payer: Self-pay | Admitting: Primary Care

## 2016-12-17 ENCOUNTER — Telehealth: Payer: Self-pay

## 2016-12-17 ENCOUNTER — Ambulatory Visit (INDEPENDENT_AMBULATORY_CARE_PROVIDER_SITE_OTHER): Payer: BLUE CROSS/BLUE SHIELD | Admitting: Primary Care

## 2016-12-17 VITALS — BP 120/78 | HR 80 | Temp 98.1°F | Ht 66.0 in | Wt 152.1 lb

## 2016-12-17 DIAGNOSIS — Z3201 Encounter for pregnancy test, result positive: Secondary | ICD-10-CM

## 2016-12-17 LAB — POCT URINE PREGNANCY: Preg Test, Ur: POSITIVE — AB

## 2016-12-17 LAB — HCG, QUANTITATIVE, PREGNANCY: Quantitative HCG: 11953 m[IU]/mL

## 2016-12-17 NOTE — Telephone Encounter (Signed)
PLEASE NOTE: All timestamps contained within this report are represented as [place] Standard Time. CONFIDENTIALTY NOTICE: This fax transmission is intended only for the addressee. It contains information that is legally privileged, confidential or otherwise protected from use or disclosure. If you are not the intended recipient, you are strictly prohibited from reviewing, disclosing, copying using or disseminating any of this information or taking any action in reliance on or regarding this information. If you have received this fax in error, please notify us immediately by telephone so that we can arrange for its return to [place]. Phone: [phone], Toll-Free: [phone], Fax: [phone] Page: 1 of 2 Call Id: [id number] [place] Patient Name: Holly Meyer Gender: Female DOB: Mar 05, 1996 Age: 21 Y 49 M 18 D Return Phone Number: [phone] (Primary), [phone] (Secondary) City/State/Zip: [place] Client [place] [facility] - Client Client Site [place] - Night Physician Alma Friendly - NP Who Is Calling Patient / Member / Family / Caregiver Call Type Triage / Clinical Relationship To Patient Self Return Phone Number [phone] (Primary) Chief Complaint Headache Reason for Call Symptomatic / Request for Health Information Initial Comment States has a bad nauseous migraine and just found out she is pregnant, thinks about a month along. Wanting to know what to take. Nurse Assessment Nurse: Leilani Merl, RN, Heather Date/Time (Eastern Time): 12/15/2016 9:51:42 AM Confirm and document reason for call. If symptomatic, describe symptoms. ---States has a bad nauseous migraine and just found out she is pregnant, thinks about a month along. Wanting to know what to take. Does the PT have any chronic conditions? (i.e. diabetes, asthma, etc.) ---Yes List  chronic conditions. ---back mass Is the patient pregnant or possibly pregnant? (Ask all females between the ages of 51-55) ---Yes How many weeks gestation? ---4 What is the estimated delivery date? ---0001-01-01 Total number of pregnancies including current? ---1 Number of live births? ---0 Have you felt decreased fetal movement? ---Early Pregnancy - No Fetal Movement Felt Yet Guidelines Guideline Title Affirmed Question Pregnancy - Headache [1] MODERATE headache (e.g., interferes with normal activities) AND [2] present > 24 hours AND [3] unexplained (Exceptions: analgesics not tried, typical migraine, or headache part of viral illness) Disp. Time Eilene Ghazi Time) Disposition Final User 12/15/2016 9:55:25 AM See Physician within 24 Hours Yes [place], RN, [facility] Advice Given Per Guideline SEE PHYSICIAN WITHIN 24 HOURS: * IF OFFICE WILL BE OPEN: You need to be seen within the next 24 hours. Call your doctor when the office opens, and make an appointment. PAIN MEDICINE - ACETAMINOPHEN: * Generally, it is best to avoid medication use during pregnancy. * Acetaminophen is considered to be safe during pregnancy. ACETAMINOPHEN (E.G., TYLENOL): * Take 650 mg (two 325 mg pills) by mouth every 4-6 hours as needed. Each Regular Strength Tylenol pill has 325 mg PLEASE NOTE: All timestamps contained within this report are represented as [place] Standard Time. CONFIDENTIALTY NOTICE: This fax transmission is intended only for the addressee. It contains information that is legally privileged, confidential or otherwise protected from use or disclosure. If you are not the intended recipient, you are strictly prohibited from reviewing, disclosing, copying using or disseminating any of this information or taking any action in reliance on or regarding this information. If you have received this fax in error, please notify us immediately by telephone so that we can arrange for its return to [place]. Phone:  [phone], Toll-Free: [phone], Fax: [phone]  Page: 2 of 2 Call Id: [id number] Care Advice Given Per Guideline of acetaminophen. The most you should take each day is 3,250 mg (10 Regular Strength pills a day). CALL BACK IF: * You become worse. CARE ADVICE given per Pregnancy - Headache (Adult) guideline.

## 2016-12-17 NOTE — Telephone Encounter (Signed)
Noted and evaluated. 

## 2016-12-17 NOTE — Patient Instructions (Signed)
Complete lab work prior to leaving today. I will notify you of your results once received.   Continue taking Prenatal Vitamins daily.   Refrain from taking anything over the counter until you speak with me. You can take tylenol as needed for headaches.  You will be contacted regarding your referral to OB/ GYN. Let them know you had a quantitative pregnancy lab test. We can fax these results. Please let us know if you have not heard back within one week.   Congratulations! It was a pleasure to see you today!

## 2016-12-17 NOTE — Progress Notes (Signed)
Subjective:    Patient ID: Holly Meyer, female    DOB: September 18, 1996, 21 y.o.   MRN: [id number]  HPI  Holly Meyer is a 21 year old female who presents today for positive pregnancy test. Her LMP was 10/28/16. She did notice slight spotting in February, denies heavy bleeding. She has noticed nausea, breast tenderness, and suprapubic cramping. She denies dizziness, vomiting. She's taken four pregnancy tests at home which were all positive. She has started taking Prenatal Vitamins.   Review of Systems  Gastrointestinal: Positive for nausea. Negative for vomiting.  Genitourinary: Negative for vaginal bleeding and vaginal discharge.       Mild suprapubic cramping, mild breast tenderness  Neurological: Negative for dizziness.       Past Medical History:  Diagnosis Date  . Septate uterus   . Venous malformation    Right paraspinal     Social History   Social History  . Marital status: Single    Spouse name: N/A  . Number of children: N/A  . Years of education: N/A   Occupational History  . Not on file.   Social History Main Topics  . Smoking status: Former Smoker    Packs/day: 0.25    Types: Cigarettes  . Smokeless tobacco: Never Used     Comment: recently quit  . Alcohol use 0.0 oz/week     Comment: socailly  . Drug use: No  . Sexual activity: Yes    Birth control/ protection: None   Other Topics Concern  . Not on file   Social History Narrative   Single.   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   Works at [employer].    Enjoys swimming, watching movies.    Past Surgical History:  Procedure Laterality Date  . WISDOM TOOTH EXTRACTION  2012    Family History  Problem Relation Age of Onset  . Hypertension Father     Allergies  Allergen Reactions  . Fexofenadine Shortness Of Breath    Other reaction(s): Respiratory Distress (ALLERGY/intolerance)  . Cetirizine Hcl   . Guaifenesin    Other reaction(s): Dizziness (intolerance)  . Guaifenesin Er Other (See Comments)  . Meloxicam Other (See Comments)  . Naproxen Other (See Comments)    Loss of appiteite  . Zyrtec  [Cetirizine]     Current Outpatient Prescriptions on File Prior to Visit  Medication Sig Dispense Refill  . SUMAtriptan (IMITREX) 50 MG tablet Take 1 tablet by mouth at onset of migraine. You may take 1 additional tablet in 2 hours if no relief. Do not exceed 2 tablets in 24 hours. (Patient not taking: Reported on 12/17/2016) 10 tablet 1   No current facility-administered medications on file prior to visit.     BP 120/78   Pulse 80   Temp 98.1 F (36.7 C) (Oral)   Ht 5\' 6"  (1.676 m)   Wt 152 lb 1.9 oz (69 kg)   LMP 10/28/2016   SpO2 99%   BMI 24.55 kg/m    Objective:   Physical Exam  Constitutional: She appears well-nourished.  Neck: Neck supple.  Cardiovascular: Normal rate and regular rhythm.   Pulmonary/Chest: Effort normal and breath sounds normal.  Abdominal: Soft. Bowel sounds are normal. There is no tenderness.  Skin: Skin is warm and dry.          Assessment & Plan:  Positive Pregnancy Test:  Four positive pregnancy tests at home. Also with nausea,  suprapubic cramping, breast tenderness. Urine Pregnancy: Positive today. HCG quantitative test pending. Discussed to refrain from any medications OTC, okay to take tylenol. Continue prenatal vitamins. Referral placed for OB/GYN for establishment.  Sheral Flow, NP

## 2016-12-17 NOTE — Telephone Encounter (Signed)
Pt has appt 12/17/16 at 10:45 with [name] NP.

## 2016-12-17 NOTE — Progress Notes (Signed)
Pre visit review using our clinic review tool, if applicable. No additional management support is needed unless otherwise documented below in the visit note. 

## 2016-12-24 ENCOUNTER — Ambulatory Visit (INDEPENDENT_AMBULATORY_CARE_PROVIDER_SITE_OTHER): Payer: BLUE CROSS/BLUE SHIELD | Admitting: Obstetrics and Gynecology

## 2016-12-24 VITALS — BP 120/81 | HR 77 | Ht 67.0 in | Wt 152.3 lb

## 2016-12-24 DIAGNOSIS — Z3401 Encounter for supervision of normal first pregnancy, first trimester: Secondary | ICD-10-CM

## 2016-12-24 DIAGNOSIS — Z8489 Family history of other specified conditions: Secondary | ICD-10-CM

## 2016-12-24 DIAGNOSIS — Z1389 Encounter for screening for other disorder: Secondary | ICD-10-CM

## 2016-12-24 DIAGNOSIS — Z113 Encounter for screening for infections with a predominantly sexual mode of transmission: Secondary | ICD-10-CM

## 2016-12-24 NOTE — Patient Instructions (Signed)
Pregnancy and Zika Virus Disease Zika virus disease, or Zika, is an illness that can spread to people from mosquitoes that carry the virus. It may also spread from person to person through infected body fluids. Zika first occurred in [place], but recently it has spread to new areas. The virus occurs in tropical climates. The location of Zika continues to change. Most people who become infected with Zika virus do not develop serious illness. However, Zika may cause birth defects in an unborn baby whose mother is infected with the virus. It may also increase the risk of miscarriage. What are the symptoms of Zika virus disease? In many cases, people who have been infected with Zika virus do not develop any symptoms. If symptoms appear, they usually start about a week after the person is infected. Symptoms are usually mild. They may include:  Fever.  Rash.  Red eyes.  Joint pain. How does Zika virus disease spread? The main way that [place] virus spreads is through the bite of a certain type of mosquito. Unlike most types of mosquitos, which bite only at night, the type of mosquito that carries Zika virus bites both at night and during the day. Zika virus can also spread through sexual contact, through a blood transfusion, and from a mother to her baby before or during birth. Once you have had Zika virus disease, it is unlikely that you will get it again. Can I pass Zika to my baby during pregnancy? Yes, Zika can pass from a mother to her baby before or during birth. What problems can Zika cause for my baby? A woman who is infected with Zika virus while pregnant is at risk of having her baby born with a condition in which the brain or head is smaller than expected (microcephaly). Babies who have microcephaly can have developmental delays, seizures, hearing problems, and vision problems. Having Zika virus disease during pregnancy can also increase the risk of miscarriage. How can Zika virus disease be  prevented? There is no vaccine to prevent Zika. The best way to prevent the disease is to avoid infected mosquitoes and avoid exposure to body fluids that can spread the virus. Avoid any possible exposure to [place] by taking the following precautions. For women and their sex partners:  Avoid traveling to high-risk areas. The locations where [place] is being reported change often. To identify high-risk areas, check the CDC travel website: CreditChaos.com.ee  If you or your sex partner must travel to a high-risk area, talk with a health care provider before and after traveling.  Take all precautions to avoid mosquito bites if you live in, or travel to, any of the high-risk areas. Insect repellents are safe to use during pregnancy.  Ask your health care provider when it is safe to have sexual contact. For women:  If you are pregnant or trying to become pregnant, avoid sexual contact with persons who may have been exposed to [place] virus, persons who have possible symptoms of Zika, or persons whose history you are unsure about. If you choose to have sexual contact with someone who may have been exposed to [place] virus, use condoms correctly during the entire duration of sexual activity, every time. Do not share sexual devices, as you may be exposed to body fluids.  Ask your health care provider about when it is safe to attempt pregnancy after a possible exposure to [place] virus. What steps should I take to avoid mosquito bites? Take these steps to avoid mosquito bites when you are  in a high-risk area:  Wear loose clothing that covers your arms and legs.  Limit your outdoor activities.  Do not open windows unless they have window screens.  Sleep under mosquito nets.  Use insect repellent. The best insect repellents have:  DEET, picaridin, oil of lemon eucalyptus (OLE), or [id number] in them.  Higher amounts of an active ingredient in them.  Remember that insect repellents are safe to use  during pregnancy.  Do not use OLE on children who are younger than 37 years of age. Do not use insect repellent on babies who are younger than 37 months of age.  Cover your child's stroller with mosquito netting. Make sure the netting fits snugly and that any loose netting does not cover your child's mouth or nose. Do not use a blanket as a mosquito-protection cover.  Do not apply insect repellent underneath clothing.  If you are using sunscreen, apply the sunscreen before applying the insect repellent.  Treat clothing with permethrin. Do not apply permethrin directly to your skin. Follow label directions for safe use.  Get rid of standing water, where mosquitoes may reproduce. Standing water is often found in items such as buckets, bowls, animal food dishes, and flowerpots. When you return from traveling to any high-risk area, continue taking actions to protect yourself against mosquito bites for 3 weeks, even if you show no signs of illness. This will prevent spreading Zika virus to uninfected mosquitoes. What should I know about the sexual transmission of Zika? People can spread Zika to their sexual partners during vaginal, anal, or oral sex, or by sharing sexual devices. Many people with [place] do not develop symptoms, so a person could spread the disease without knowing that they are infected. The greatest risk is to women who are pregnant or who may become pregnant. Zika virus can live longer in semen than it can live in blood. Couples can prevent sexual transmission of the virus by:  Using condoms correctly during the entire duration of sexual activity, every time. This includes vaginal, anal, and oral sex.  Not sharing sexual devices. Sharing increases your risk of being exposed to body fluid from another person.  Avoiding all sexual activity until your health care provider says it is safe. Should I be tested for Zika virus? A sample of your blood can be tested for Zika virus. A pregnant  woman should be tested if she may have been exposed to the virus or if she has symptoms of Zika. She may also have additional tests done during her pregnancy, such ultrasound testing. Talk with your health care provider about which tests are recommended. This information is not intended to replace advice given to you by your health care provider. Make sure you discuss any questions you have with your health care provider. Document Released: 06/22/2015 Document Revised: 03/08/2016 Document Reviewed: 06/15/2015 Elsevier Interactive Patient Education  2017 [place] and Medications in Pregnancy  Cold/Flu:  Sudafed for congestion- Robitussin (plain) for cough- Tylenol for discomfort.  Please follow the directions on the label.  Try not to take any more than needed.  OTC Saline nasal spray and air humidifier or cool-mist  Vaporizer to sooth nasal irritation and to loosen congestion.  It is also important to increase intake of non carbonated fluids, especially if you have a fever.  Constipation:  Colace-2 capsules at bedtime; Metamucil- follow directions on label; Senokot- 1 tablet at bedtime.  Any one of these medications can be used.  It is also  very important to increase fluids and fruits along with regular exercise.  If problem persists please call the office.  Diarrhea:  Kaopectate as directed on the label.  Eat a bland diet and increase fluids.  Avoid highly seasoned foods.  Headache:  Tylenol 1 or 2 tablets every 3-4 hours as needed  Indigestion:  Maalox, Mylanta, Tums or Rolaids- as directed on label.  Also try to eat small meals and avoid fatty, greasy or spicy foods.  Nausea with or without Vomiting:  Nausea in pregnancy is caused by increased levels of hormones in the body which influence the digestive system and cause irritation when stomach acids accumulate.  Symptoms usually subside after 1st trimester of pregnancy.  Try the following: 1. Keep saltines, graham crackers  or dry toast by your bed to eat upon awakening. 2. Don't let your stomach get empty.  Try to eat 5-6 small meals per day instead of 3 large ones. 3. Avoid greasy fatty or highly seasoned foods.  4. Take OTC Unisom 1 tablet at bed time along with OTC Vitamin B6 25-50 mg 3 times per day.    If nausea continues with vomiting and you are unable to keep down food and fluids you may need a prescription medication.  Please notify your provider.   Sore throat:  Chloraseptic spray, throat lozenges and or plain Tylenol.  Vaginal Yeast Infection:  OTC Monistat for 7 days as directed on label.  If symptoms do not resolve within a week notify provider.  If any of the above problems do not subside with recommended treatment please call the office for further assistance.   Do not take Aspirin, Advil, Motrin or Ibuprofen.  * * OTC= Over the counter Hyperemesis Gravidarum Hyperemesis gravidarum is a severe form of nausea and vomiting that happens during pregnancy. Hyperemesis is worse than morning sickness. It may cause you to have nausea or vomiting all day for many days. It may keep you from eating and drinking enough food and liquids. Hyperemesis usually occurs during the first half (the first 20 weeks) of pregnancy. It often goes away once a woman is in her second half of pregnancy. However, sometimes hyperemesis continues through an entire pregnancy. What are the causes? The cause of this condition is not known. It may be related to changes in chemicals (hormones) in the body during pregnancy, such as the high level of pregnancy hormone (human chorionic gonadotropin) or the increase in the female sex hormone (estrogen). What are the signs or symptoms? Symptoms of this condition include:  Severe nausea and vomiting.  Nausea that does not go away.  Vomiting that does not allow you to keep any food down.  Weight loss.  Body fluid loss (dehydration).  Having no desire to eat, or not liking food that you  have previously enjoyed. How is this diagnosed? This condition may be diagnosed based on:  A physical exam.  Your medical history.  Your symptoms.  Blood tests.  Urine tests. How is this treated? This condition may be managed with medicine. If medicines to do not help relieve nausea and vomiting, you may need to receive fluids through an IV tube at the hospital. Follow these instructions at home:  Take over-the-counter and prescription medicines only as told by your health care provider.  Avoid iron pills and multivitamins that contain iron for the first 3-4 months of pregnancy. If you take prescription iron pills, do not stop taking them unless your health care provider approves.  Take the  following actions to help prevent nausea and vomiting:  In the morning, before getting out of bed, try eating a couple of dry crackers or a piece of toast.  Avoid foods and smells that upset your stomach. Fatty and spicy foods may make nausea worse.  Eat 5-6 small meals a day.  Do not drink fluids while eating meals. Drink between meals.  Eat or suck on things that have ginger in them. Ginger can help relieve nausea.  Avoid food preparation. The smell of food can spoil your appetite or trigger nausea.  Follow instructions from your health care provider about eating or drinking restrictions.  For snacks, eat high-protein foods, such as cheese.  Keep all follow-up and pre-birth (prenatal) visits as told by your health care provider. This is important. Contact a health care provider if:  You have pain in your abdomen.  You have a severe headache.  You have vision problems.  You are losing weight. Get help right away if:  You cannot drink fluids without vomiting.  You vomit blood.  You have constant nausea and vomiting.  You are very weak.  You are very thirsty.  You feel dizzy.  You faint.  You have a fever or other symptoms that last for more than 2-3 days.  You  have a fever and your symptoms suddenly get worse. Summary  Hyperemesis gravidarum is a severe form of nausea and vomiting that happens during pregnancy.  Making some changes to your eating habits may help relieve nausea and vomiting.  This condition may be managed with medicine.  If medicines to do not help relieve nausea and vomiting, you may need to receive fluids through an IV tube at the hospital. This information is not intended to replace advice given to you by your health care provider. Make sure you discuss any questions you have with your health care provider. Document Released: 10/01/2005 Document Revised: 05/30/2016 Document Reviewed: 05/30/2016 Elsevier Interactive Patient Education  2017 [place] of Pregnancy The first trimester of pregnancy is from week 1 until the end of week 13 (months 1 through 3). During this time, your baby will begin to develop inside you. At 6-8 weeks, the eyes and face are formed, and the heartbeat can be seen on ultrasound. At the end of 12 weeks, all the baby's organs are formed. Prenatal care is all the medical care you receive before the birth of your baby. Make sure you get good prenatal care and follow all of your doctor's instructions. Follow these instructions at home: Medicines   Take over-the-counter and prescription medicines only as told by your doctor. Some medicines are safe and some medicines are not safe during pregnancy.  Take a prenatal vitamin that contains at least 600 micrograms (mcg) of folic acid.  If you have trouble pooping (constipation), take medicine that will make your stool soft (stool softener) if your doctor approves. Eating and drinking   Eat regular, healthy meals.  Your doctor will tell you the amount of weight gain that is right for you.  Avoid raw meat and uncooked cheese.  If you feel sick to your stomach (nauseous) or throw up (vomit):  Eat 4 or 5 small meals a day instead of 3 large  meals.  Try eating a few soda crackers.  Drink liquids between meals instead of during meals.  To prevent constipation:  Eat foods that are high in fiber, like fresh fruits and vegetables, whole grains, and beans.  Drink enough fluids to  keep your pee (urine) clear or pale yellow. Activity   Exercise only as told by your doctor. Stop exercising if you have cramps or pain in your lower belly (abdomen) or low back.  Do not exercise if it is too hot, too humid, or if you are in a place of great height (high altitude).  Try to avoid standing for long periods of time. Move your legs often if you must stand in one place for a long time.  Avoid heavy lifting.  Wear low-heeled shoes. Sit and stand up straight.  You can have sex unless your doctor tells you not to. Relieving pain and discomfort   Wear a good support bra if your breasts are sore.  Take warm water baths (sitz baths) to soothe pain or discomfort caused by hemorrhoids. Use hemorrhoid cream if your doctor says it is okay.  Rest with your legs raised if you have leg cramps or low back pain.  If you have puffy, bulging veins (varicose veins) in your legs:  Wear support hose or compression stockings as told by your doctor.  Raise (elevate) your feet for 15 minutes, 3-4 times a day.  Limit salt in your food. Prenatal care   Schedule your prenatal visits by the twelfth week of pregnancy.  Write down your questions. Take them to your prenatal visits.  Keep all your prenatal visits as told by your doctor. This is important. Safety   Wear your seat belt at all times when driving.  Make a list of emergency phone numbers. The list should include numbers for family, friends, the hospital, and police and fire departments. General instructions   Ask your doctor for a referral to a local prenatal class. Begin classes no later than at the start of month 6 of your pregnancy.  Ask for help if you need counseling or if you  need help with nutrition. Your doctor can give you advice or tell you where to go for help.  Do not use hot tubs, steam rooms, or saunas.  Do not douche or use tampons or scented sanitary pads.  Do not cross your legs for long periods of time.  Avoid all herbs and alcohol. Avoid drugs that are not approved by your doctor.  Do not use any tobacco products, including cigarettes, chewing tobacco, and electronic cigarettes. If you need help quitting, ask your doctor. You may get counseling or other support to help you quit.  Avoid cat litter boxes and soil used by cats. These carry germs that can cause birth defects in the baby and can cause a loss of your baby (miscarriage) or stillbirth.  Visit your dentist. At home, brush your teeth with a soft toothbrush. Be gentle when you floss. Contact a doctor if:  You are dizzy.  You have mild cramps or pressure in your lower belly.  You have a nagging pain in your belly area.  You continue to feel sick to your stomach, you throw up, or you have watery poop (diarrhea).  You have a bad smelling fluid coming from your vagina.  You have pain when you pee (urinate).  You have increased puffiness (swelling) in your face, hands, legs, or ankles. Get help right away if:  You have a fever.  You are leaking fluid from your vagina.  You have spotting or bleeding from your vagina.  You have very bad belly cramping or pain.  You gain or lose weight rapidly.  You throw up blood. It may look like coffee  grounds.  You are around people who have [place] measles, fifth disease, or chickenpox.  You have a very bad headache.  You have shortness of breath.  You have any kind of trauma, such as from a fall or a car accident. Summary  The first trimester of pregnancy is from week 1 until the end of week 13 (months 1 through 3).  To take care of yourself and your unborn baby, you will need to eat healthy meals, take medicines only if your doctor  tells you to do so, and do activities that are safe for you and your baby.  Keep all follow-up visits as told by your doctor. This is important as your doctor will have to ensure that your baby is healthy and growing well. This information is not intended to replace advice given to you by your health care provider. Make sure you discuss any questions you have with your health care provider. Document Released: 03/19/2008 Document Revised: 10/09/2016 Document Reviewed: 10/09/2016 Elsevier Interactive Patient Education  2017 [place]. Commonly Asked Questions During Pregnancy  Cats: A parasite can be excreted in cat feces.  To avoid exposure you need to have another person empty the little box.  If you must empty the litter box you will need to wear gloves.  Wash your hands after handling your cat.  This parasite can also be found in raw or undercooked meat so this should also be avoided.  Colds, Sore Throats, Flu: Please check your medication sheet to see what you can take for symptoms.  If your symptoms are unrelieved by these medications please call the office.  Dental Work: Most any dental work [employer] recommends is permitted.  X-rays should only be taken during the first trimester if absolutely necessary.  Your abdomen should be shielded with a lead apron during all x-rays.  Please notify your provider prior to receiving any x-rays.  Novocaine is fine; gas is not recommended.  If your dentist requires a note from [place] prior to dental work please call the office and we will provide one for you.  Exercise: Exercise is an important part of staying healthy during your pregnancy.  You may continue most exercises you were accustomed to prior to pregnancy.  Later in your pregnancy you will most likely notice you have difficulty with activities requiring balance like riding a bicycle.  It is important that you listen to your body and avoid activities that put you at a higher risk of falling.   Adequate rest and staying well hydrated are a must!  If you have questions about the safety of specific activities ask your provider.    Exposure to Children with illness: Try to avoid obvious exposure; report any symptoms to [place] when noted,  If you have chicken pos, red measles or mumps, you should be immune to these diseases.   Please do not take any vaccines while pregnant unless you have checked with your OB provider.  Fetal Movement: After 28 weeks we recommend you do "kick counts" twice daily.  Lie or sit down in a calm quiet environment and count your baby movements "kicks".  You should feel your baby at least 10 times per hour.  If you have not felt 10 kicks within the first hour get up, walk around and have something sweet to eat or drink then repeat for an additional hour.  If count remains less than 10 per hour notify your provider.  Fumigating: Follow your pest control agent's advice as  to how long to stay out of your home.  Ventilate the area well before re-entering.  Hemorrhoids:   Most over-the-counter preparations can be used during pregnancy.  Check your medication to see what is safe to use.  It is important to use a stool softener or fiber in your diet and to drink lots of liquids.  If hemorrhoids seem to be getting worse please call the office.   Hot Tubs:  Hot tubs Jacuzzis and saunas are not recommended while pregnant.  These increase your internal body temperature and should be avoided.  Intercourse:  Sexual intercourse is safe during pregnancy as long as you are comfortable, unless otherwise advised by your provider.  Spotting may occur after intercourse; report any bright red bleeding that is heavier than spotting.  Labor:  If you know that you are in labor, please go to the hospital.  If you are unsure, please call the office and let us help you decide what to do.  Lifting, straining, etc:  If your job requires heavy lifting or straining please check with your provider for  any limitations.  Generally, you should not lift items heavier than that you can lift simply with your hands and arms (no back muscles)  Painting:  Paint fumes do not harm your pregnancy, but may make you ill and should be avoided if possible.  Latex or water based paints have less odor than oils.  Use adequate ventilation while painting.  Permanents & Hair Color:  Chemicals in hair dyes are not recommended as they cause increase hair dryness which can increase hair loss during pregnancy.  " Highlighting" and permanents are allowed.  Dye may be absorbed differently and permanents may not hold as well during pregnancy.  Sunbathing:  Use a sunscreen, as skin burns easily during pregnancy.  Drink plenty of fluids; avoid over heating.  Tanning Beds:  Because their possible side effects are still unknown, tanning beds are not recommended.  Ultrasound Scans:  Routine ultrasounds are performed at approximately 20 weeks.  You will be able to see your baby's general anatomy an if you would like to know the gender this can usually be determined as well.  If it is questionable when you conceived you may also receive an ultrasound early in your pregnancy for dating purposes.  Otherwise ultrasound exams are not routinely performed unless there is a medical necessity.  Although you can request a scan we ask that you pay for it when conducted because insurance does not cover " patient request" scans.  Work: If your pregnancy proceeds without complications you may work until your due date, unless your physician or employer advises otherwise.  Round Ligament Pain/Pelvic Discomfort:  Sharp, shooting pains not associated with bleeding are fairly common, usually occurring in the second trimester of pregnancy.  They tend to be worse when standing up or when you remain standing for long periods of time.  These are the result of pressure of certain pelvic ligaments called "round ligaments".  Rest, Tylenol and heat seem to be  the most effective relief.  As the womb and fetus grow, they rise out of the pelvis and the discomfort improves.  Please notify the office if your pain seems different than that described.  It may represent a more serious condition.

## 2016-12-24 NOTE — Progress Notes (Signed)
Holly Meyer presents for [facility] nurse interview visit. Pregnancy confirmation done on 12/17/2016 at PCP, by K. Carlis Abbott, NP. UPT: positive.  G-1  P-0000. Pregnancy education material explained and given. No cats in the home. NOB labs ordered.  HIV labs and Drug screen were explained optional and she did not decline. Drug screen ordered. PNV encouraged. Genetic screening options discussed. Genetic testing: Unsure.  Pt will contact insurance company as to what is covered. Pt may discuss with provider. Ultrasound ordered for dating and viability. Has hx of irregular vaginal bleeding and FHX Twins on both sides of family (husband and pt's).  Pt. To follow up with provider in 4 weeks for NOB physical.  All questions answered.

## 2016-12-25 LAB — URINALYSIS, ROUTINE W REFLEX MICROSCOPIC
BILIRUBIN UA: NEGATIVE
GLUCOSE, UA: NEGATIVE
Ketones, UA: NEGATIVE
Leukocytes, UA: NEGATIVE
Nitrite, UA: NEGATIVE
Protein, UA: NEGATIVE
RBC UA: NEGATIVE
SPEC GRAV UA: 1.021 (ref 1.005–1.030)
UUROB: 0.2 mg/dL (ref 0.2–1.0)
pH, UA: 5.5 (ref 5.0–7.5)

## 2016-12-26 ENCOUNTER — Ambulatory Visit (INDEPENDENT_AMBULATORY_CARE_PROVIDER_SITE_OTHER): Payer: BLUE CROSS/BLUE SHIELD

## 2016-12-26 DIAGNOSIS — Z8489 Family history of other specified conditions: Secondary | ICD-10-CM | POA: Diagnosis not present

## 2016-12-26 DIAGNOSIS — Z3401 Encounter for supervision of normal first pregnancy, first trimester: Secondary | ICD-10-CM | POA: Diagnosis not present

## 2016-12-26 LAB — NICOTINE SCREEN, URINE: COTININE UR QL SCN: NEGATIVE ng/mL

## 2016-12-26 LAB — CBC WITH DIFFERENTIAL/PLATELET
BASOS ABS: 0 10*3/uL (ref 0.0–0.2)
Basos: 1 %
EOS (ABSOLUTE): 0.1 10*3/uL (ref 0.0–0.4)
Eos: 2 %
Hematocrit: 38.8 % (ref 34.0–46.6)
Hemoglobin: 13.2 g/dL (ref 11.1–15.9)
IMMATURE GRANS (ABS): 0 10*3/uL (ref 0.0–0.1)
Immature Granulocytes: 0 %
LYMPHS ABS: 2.3 10*3/uL (ref 0.7–3.1)
LYMPHS: 36 %
MCH: 29.9 pg (ref 26.6–33.0)
MCHC: 34 g/dL (ref 31.5–35.7)
MCV: 88 fL (ref 79–97)
MONOCYTES: 6 %
Monocytes Absolute: 0.4 10*3/uL (ref 0.1–0.9)
Neutrophils Absolute: 3.6 10*3/uL (ref 1.4–7.0)
Neutrophils: 55 %
PLATELETS: 273 10*3/uL (ref 150–379)
RBC: 4.41 x10E6/uL (ref 3.77–5.28)
RDW: 13.5 % (ref 12.3–15.4)
WBC: 6.4 10*3/uL (ref 3.4–10.8)

## 2016-12-26 LAB — MONITOR DRUG PROFILE 14(MW)
Amphetamine Scrn, Ur: NEGATIVE ng/mL
BARBITURATE SCREEN URINE: NEGATIVE ng/mL
BENZODIAZEPINE SCREEN, URINE: NEGATIVE ng/mL
Buprenorphine, Urine: NEGATIVE ng/mL
CANNABINOIDS UR QL SCN: NEGATIVE ng/mL
CREATININE(CRT), U: 153.1 mg/dL (ref 20.0–300.0)
Cocaine (Metab) Scrn, Ur: NEGATIVE ng/mL
FENTANYL, URINE: NEGATIVE pg/mL
METHADONE SCREEN, URINE: NEGATIVE ng/mL
Meperidine Screen, Urine: NEGATIVE ng/mL
OPIATE SCREEN URINE: NEGATIVE ng/mL
OXYCODONE+OXYMORPHONE UR QL SCN: NEGATIVE ng/mL
PH UR, DRUG SCRN: 5.3 (ref 4.5–8.9)
PHENCYCLIDINE QUANTITATIVE URINE: NEGATIVE ng/mL
Propoxyphene Scrn, Ur: NEGATIVE ng/mL
SPECIFIC GRAVITY: 1.018
Tramadol Screen, Urine: NEGATIVE ng/mL

## 2016-12-26 LAB — HIV ANTIBODY (ROUTINE TESTING W REFLEX): HIV Screen 4th Generation wRfx: NONREACTIVE

## 2016-12-26 LAB — RH TYPE: Rh Factor: NEGATIVE

## 2016-12-26 LAB — RUBELLA SCREEN: Rubella Antibodies, IGG: 5.97 index (ref >0.99)

## 2016-12-26 LAB — HEPATITIS B SURFACE ANTIGEN: Hepatitis B Surface Ag: NEGATIVE

## 2016-12-26 LAB — ABO

## 2016-12-26 LAB — GC/CHLAMYDIA PROBE AMP
CHLAMYDIA, DNA PROBE: NEGATIVE
NEISSERIA GONORRHOEAE BY PCR: NEGATIVE

## 2016-12-26 LAB — RPR: RPR: NONREACTIVE

## 2016-12-26 LAB — ANTIBODY SCREEN: ANTIBODY SCREEN: NEGATIVE

## 2016-12-26 LAB — VARICELLA ZOSTER ANTIBODY, IGG: Varicella zoster IgG: <135 index — ABNORMAL LOW (ref >165)

## 2016-12-30 LAB — URINE CULTURE, OB REFLEX

## 2016-12-30 LAB — CULTURE, OB URINE

## 2016-12-31 ENCOUNTER — Ambulatory Visit (INDEPENDENT_AMBULATORY_CARE_PROVIDER_SITE_OTHER): Payer: BLUE CROSS/BLUE SHIELD | Admitting: Obstetrics and Gynecology

## 2016-12-31 ENCOUNTER — Encounter: Payer: Self-pay | Admitting: Obstetrics and Gynecology

## 2016-12-31 VITALS — BP 134/78 | HR 82 | Wt 152.6 lb

## 2016-12-31 DIAGNOSIS — O200 Threatened abortion: Secondary | ICD-10-CM

## 2016-12-31 LAB — POCT URINALYSIS DIPSTICK
BILIRUBIN UA: NEGATIVE
Glucose, UA: NEGATIVE
KETONES UA: NEGATIVE
Leukocytes, UA: NEGATIVE
Nitrite, UA: NEGATIVE
PH UA: 5 (ref 5.0–8.0)
PROTEIN UA: NEGATIVE
RBC UA: NEGATIVE
SPEC GRAV UA: 1.01 (ref 1.030–1.035)
Urobilinogen, UA: NEGATIVE (ref ?–2.0)

## 2016-12-31 MED ORDER — RHO D IMMUNE GLOBULIN 1500 UNITS IM SOSY
1500.0000 [IU] | PREFILLED_SYRINGE | Freq: Once | INTRAMUSCULAR | Status: AC
  Administered 2016-12-31: 1500 [IU] via INTRAMUSCULAR

## 2016-12-31 NOTE — Progress Notes (Signed)
Patient with complaint of spotting/mild vaginal bleeding over the last few days. She also describes midline pelvic cramping. Denies passage of tissue. The bleeding has never been heavier than a period. She had an ultrasound last week which revealed a crown-rump length with fetal heartbeat. Blood type O--RhoGAM given.  Patient put at pelvic rest. Ultrasound Thursday morning with follow-up visit. Signs and symptoms of miscarriage discussed in detail. Patient to contact us with any further issues.

## 2016-12-31 NOTE — Addendum Note (Signed)
Addended by: Raliegh Ip on: 12/31/2016 03:38 PM   Modules accepted: Orders

## 2016-12-31 NOTE — Progress Notes (Signed)
I have reviewed the record and concur with patient management and plan.  Rubie Maid, MD [facility]

## 2017-01-03 ENCOUNTER — Telehealth: Payer: Self-pay

## 2017-01-03 ENCOUNTER — Ambulatory Visit (INDEPENDENT_AMBULATORY_CARE_PROVIDER_SITE_OTHER): Payer: BLUE CROSS/BLUE SHIELD | Admitting: Obstetrics and Gynecology

## 2017-01-03 ENCOUNTER — Other Ambulatory Visit: Payer: Self-pay | Admitting: Obstetrics and Gynecology

## 2017-01-03 ENCOUNTER — Ambulatory Visit (INDEPENDENT_AMBULATORY_CARE_PROVIDER_SITE_OTHER): Payer: BLUE CROSS/BLUE SHIELD

## 2017-01-03 ENCOUNTER — Encounter: Payer: Self-pay | Admitting: Obstetrics and Gynecology

## 2017-01-03 VITALS — BP 112/72 | HR 78 | Wt 153.4 lb

## 2017-01-03 DIAGNOSIS — O021 Missed abortion: Secondary | ICD-10-CM

## 2017-01-03 DIAGNOSIS — O200 Threatened abortion: Secondary | ICD-10-CM

## 2017-01-03 LAB — POCT URINALYSIS DIPSTICK
BILIRUBIN UA: NEGATIVE
Blood, UA: NEGATIVE
Glucose, UA: NEGATIVE
KETONES UA: NEGATIVE
LEUKOCYTES UA: NEGATIVE
Nitrite, UA: NEGATIVE
Protein, UA: NEGATIVE
Spec Grav, UA: 1.015 (ref 1.030–1.035)
Urobilinogen, UA: NEGATIVE (ref ?–2.0)
pH, UA: 7.5 (ref 5.0–8.0)

## 2017-01-03 LAB — URINE CULTURE: ORGANISM ID, BACTERIA: NO GROWTH

## 2017-01-03 MED ORDER — RHO D IMMUNE GLOBULIN 1500 UNITS IM SOSY: 1500.0000 [IU] | PREFILLED_SYRINGE | Freq: Once | INTRAMUSCULAR | Status: DC

## 2017-01-03 NOTE — H&P (Signed)
PRE-OPERATIVE HISTORY AND PHYSICAL EXAM  PCP:  Sheral Flow, NP Subjective:   HPI:  Holly Meyer is a 21 y.o. G1P0000.  Patient's last menstrual period was 10/28/2016 (exact date).  She presents today for a pre-op discussion and PE.  She has the following symptoms:  She presents today After having an ultrasound which revealed a fetal pole without heartbeat. There's been no growth since her last ultrasound a week ago. She had spotting earlier this week but this has resolved. She has negative blood type and received RhoGAM within a week.  Review of Systems:   Constitutional: Denied constitutional symptoms, night sweats, recent illness, fatigue, fever, insomnia and weight loss.  Eyes: Denied eye symptoms, eye pain, photophobia, vision change and visual disturbance.  Ears/Nose/Throat/Neck: Denied ear, nose, throat or neck symptoms, hearing loss, nasal discharge, sinus congestion and sore throat.  Cardiovascular: Denied cardiovascular symptoms, arrhythmia, chest pain/pressure, edema, exercise intolerance, orthopnea and palpitations.  Respiratory: Denied pulmonary symptoms, asthma, pleuritic pain, productive sputum, cough, dyspnea and wheezing.  Gastrointestinal: Denied, gastro-esophageal reflux, melena, nausea and vomiting.  Genitourinary: Denied genitourinary symptoms including symptomatic vaginal discharge, pelvic relaxation issues, and urinary complaints.  Musculoskeletal: Denied musculoskeletal symptoms, stiffness, swelling, muscle weakness and myalgia.  Dermatologic: Denied dermatology symptoms, rash and scar.  Neurologic: Denied neurology symptoms, dizziness, headache, neck pain and syncope.  Psychiatric: Denied psychiatric symptoms, anxiety and depression.  Endocrine: Denied endocrine symptoms including hot flashes and night sweats.   OB History  Gravida Para Term Preterm AB Living  1 0 0 0 0 0  SAB TAB Ectopic Multiple Live Births  0 0 0 0      # Outcome Date GA  Lbr Len/2nd Weight Sex Delivery Anes PTL Lv  1 Current               Past Medical History:  Diagnosis Date  . Septate uterus   . Venous malformation    Right paraspinal    Past Surgical History:  Procedure Laterality Date  . WISDOM TOOTH EXTRACTION  2012      SOCIAL HISTORY: History  Smoking Status  . Former Smoker  . Packs/day: 0.25  . Types: Cigarettes  Smokeless Tobacco  . Never Used    Comment: recently quit   History  Alcohol Use  . 0.0 oz/week    Comment: socially   History  Drug Use No    Family History  Problem Relation Age of Onset  . Hypertension Father   . Migraines Mother   . Diabetes Paternal Grandmother     family history  . Heart failure Paternal Grandfather     ALLERGIES:  Fexofenadine; Imitrex [sumatriptan]; Cetirizine hcl; Guaifenesin; Guaifenesin er; Meloxicam; Naproxen; and Zyrtec  [cetirizine]  MEDS:   Current Outpatient Prescriptions on File Prior to Visit  Medication Sig Dispense Refill  . Prenatal Vit-Fe Fumarate-FA (MULTIVITAMIN-PRENATAL) 27-0.8 MG TABS tablet Take 1 tablet by mouth daily at 12 noon.     No current facility-administered medications on file prior to visit.     Meds ordered this encounter  Medications  . DISCONTD: Abby Potash Immune Globulin SOSY 1,500 Units     Physical examination BP 112/72   Pulse 78   Wt 153 lb 7 oz (69.6 kg)   LMP 10/28/2016 (Exact Date) Comment: irregular bleeding  BMI 24.03 kg/m   General NAD, Conversant  HEENT Atraumatic; Op clear with mmm.  Normo-cephalic. Pupils reactive. Anicteric sclerae  Thyroid/Neck Smooth without nodularity or enlargement. Normal  ROM.  Neck Supple.  Skin No rashes, lesions or ulceration. Normal palpated skin turgor. No nodularity.  Breasts: Deferred   Lungs: Clear to auscultation.No rales or wheezes. Normal Respiratory effort, no retractions.  Heart: NSR.  No murmurs or rubs appreciated. No periferal edema  Abdomen: Soft.  Non-tender.  No masses.  No HSM. No  hernia  Extremities: Moves all appropriately.  Normal ROM for age. No lymphadenopathy.  Neuro: Oriented to PPT.  Normal mood. Normal affect.     Pelvic:   Vulva: Normal appearance.  No lesions.  Vagina: No lesions or abnormalities noted.  Support: Normal pelvic support.  Urethra No masses tenderness or scarring.  Meatus Normal size without lesions or prolapse.  Cervix: Normal ectropion.  No lesions.  Anus: Normal exam.  No lesions.  Perineum: Normal exam.  No lesions.        Bimanual   Uterus:   8 wks size Non-tender.  Mobile.  AV.  Adnexae: No masses.  Non-tender to palpation.  Cul-de-sac: Negative for abnormality.   Assessment:   G1P0000 Patient Active Problem List   Diagnosis Date Noted  . Allergic rhinitis 02/28/2016  . Headache, migraine 08/02/2015  . Encounter for initial prescription of contraceptive pills 04/28/2015  . Back pain 04/28/2015  . Chronic pain syndrome 08/19/2013  . Venous malformation 02/28/2012    1. Missed ab      Plan:   1.  Dilation and evacuation for missed AB   Finis Bud, M.D. 01/03/2017 11:46 AM

## 2017-01-03 NOTE — Progress Notes (Signed)
HPI:      [name] is a 21 y.o. G1P0000 who LMP was Holly Meyer's last menstrual period was 10/28/2016 (exact date).  Subjective:   She presents today After having an ultrasound which revealed a fetal pole without heartbeat. There's been no growth since her last ultrasound a week ago. She had spotting earlier this week but this has resolved. She has negative blood type and received RhoGAM within a week.    Hx: The following portions of the Holly Meyer's history were reviewed and updated as appropriate:              She  has a past medical history of Septate uterus and Venous malformation. She  does not have any pertinent problems on file. She  has a past surgical history that includes Wisdom tooth extraction (2012). Her family history includes Diabetes in her paternal grandmother; Heart failure in her paternal grandfather; Hypertension in her father; Migraines in her mother. She  reports that she has quit smoking. Her smoking use included Cigarettes. She smoked 0.25 packs per day. She has never used smokeless tobacco. She reports that she drinks alcohol. She reports that she does not use drugs. Current Outpatient Prescriptions on File Prior to Visit  Medication Sig Dispense Refill  . Prenatal Vit-Fe Fumarate-FA (MULTIVITAMIN-PRENATAL) 27-0.8 MG TABS tablet Take 1 tablet by mouth daily at 12 noon.     No current facility-administered medications on file prior to visit.          Review of Systems:  Review of Systems  Constitutional: Denied constitutional symptoms, night sweats, recent illness, fatigue, fever, insomnia and weight loss.  Eyes: Denied eye symptoms, eye pain, photophobia, vision change and visual disturbance.  Ears/Nose/Throat/Neck: Denied ear, nose, throat or neck symptoms, hearing loss, nasal discharge, sinus congestion and sore throat.  Cardiovascular: Denied cardiovascular symptoms, arrhythmia, chest pain/pressure, edema, exercise intolerance, orthopnea and palpitations.   Respiratory: Denied pulmonary symptoms, asthma, pleuritic pain, productive sputum, cough, dyspnea and wheezing.  Gastrointestinal: Denied, gastro-esophageal reflux, melena, nausea and vomiting.  Genitourinary: Denied genitourinary symptoms including symptomatic vaginal discharge, pelvic relaxation issues, and urinary complaints.  Musculoskeletal: Denied musculoskeletal symptoms, stiffness, swelling, muscle weakness and myalgia.  Dermatologic: Denied dermatology symptoms, rash and scar.  Neurologic: Denied neurology symptoms, dizziness, headache, neck pain and syncope.  Psychiatric: Denied psychiatric symptoms, anxiety and depression.  Endocrine: Denied endocrine symptoms including hot flashes and night sweats.   Meds:   Current Outpatient Prescriptions on File Prior to Visit  Medication Sig Dispense Refill  . Prenatal Vit-Fe Fumarate-FA (MULTIVITAMIN-PRENATAL) 27-0.8 MG TABS tablet Take 1 tablet by mouth daily at 12 noon.     No current facility-administered medications on file prior to visit.     Objective:     Vitals:   01/03/17 1057  BP: 112/72  Pulse: 78                 Pelvic:   Vulva: Normal appearance.  No lesions.  Vagina: No lesions or abnormalities noted.  Support: Normal pelvic support.  Urethra No masses tenderness or scarring.  Meatus Normal size without lesions or prolapse.  Cervix: Normal appearance.  No lesions.  Anus: Normal exam.  No lesions.  Perineum: Normal exam.  No lesions.        Bimanual   Adnexae: No masses.  Non-tender to palpation.  Uterus: Enlarged. 8 wks Non-tender.  Mobile.  AV.  Adnexae: No masses.  Non-tender to palpation.  Cul-de-sac: Negative for abnormality.  Adnexae: No masses.  Non-tender to palpation.         Pelvimetry   Diagonal: Reached.  Spines: Average.  Sacrum: Concave.  Pubic Arch: Normal.      Assessment:    G1P0000 Holly Meyer Active Problem List   Diagnosis Date Noted  . Allergic rhinitis 02/28/2016  . Headache,  migraine 08/02/2015  . Encounter for initial prescription of contraceptive pills 04/28/2015  . Back pain 04/28/2015  . Chronic pain syndrome 08/19/2013  . Venous malformation 02/28/2012     1. Missed ab     Approximately 9 weeks   Plan:            1.  Multiple options have been discussed with the Holly Meyer. These include expectant management, Cytotec, and D&E. Holly Meyer has chosen D&E. D&C/E The procedure and the risks and benefits of dilation and curettage/evacuation have been explained to the Holly Meyer.  The specific risks of bleeding, infection, anesthesia, uterine perforation, and damage to bowel or bladder  have been specifically discussed.  I have answered all of her questions and I believe that she has an adequate and informed understanding of this procedure.  Orders Orders Placed This Encounter  Procedures  . POCT urinalysis dipstick     Meds ordered this encounter  Medications  . DISCONTD: Rho D Immune Globulin SOSY 1,500 Units        F/U  Return for As Scheduled Post-op.   Finis Bud, M.D. 01/03/2017 11:38 AM

## 2017-01-03 NOTE — Telephone Encounter (Signed)
Informed patient of negative results per provider 

## 2017-01-03 NOTE — Telephone Encounter (Signed)
-----   Message from Harlin Heys, MD sent at 01/03/2017  8:28 AM EDT ----- Negative Culture

## 2017-01-07 ENCOUNTER — Ambulatory Visit
Admission: RE | Admit: 2017-01-07 | Discharge: 2017-01-07 | Disposition: A | Discharge status: Home / Self Care | Payer: BLUE CROSS/BLUE SHIELD | Source: Ambulatory Visit | Attending: Obstetrics and Gynecology | Admitting: Obstetrics and Gynecology

## 2017-01-07 ENCOUNTER — Encounter
Admission: RE | Disposition: A | Discharge status: Home / Self Care | Payer: Self-pay | Source: Ambulatory Visit | Attending: Obstetrics and Gynecology

## 2017-01-07 ENCOUNTER — Ambulatory Visit: Payer: BLUE CROSS/BLUE SHIELD | Admitting: Anesthesiology

## 2017-01-07 ENCOUNTER — Encounter: Payer: Self-pay | Admitting: *Deleted

## 2017-01-07 DIAGNOSIS — R51 Headache: Secondary | ICD-10-CM | POA: Insufficient documentation

## 2017-01-07 DIAGNOSIS — Z87891 Personal history of nicotine dependence: Secondary | ICD-10-CM | POA: Insufficient documentation

## 2017-01-07 DIAGNOSIS — O021 Missed abortion: Secondary | ICD-10-CM | POA: Diagnosis not present

## 2017-01-07 HISTORY — PX: DILATION AND EVACUATION: SHX1459

## 2017-01-07 HISTORY — DX: Adverse effect of unspecified anesthetic, initial encounter: T41.45XA

## 2017-01-07 HISTORY — DX: Other complications of anesthesia, initial encounter: T88.59XA

## 2017-01-07 LAB — CBC
HEMATOCRIT: 33.3 % — AB (ref 35.0–47.0)
Hemoglobin: 11.3 g/dL — ABNORMAL LOW (ref 12.0–16.0)
MCH: 30.2 pg (ref 26.0–34.0)
MCHC: 33.9 g/dL (ref 32.0–36.0)
MCV: 89.2 fL (ref 80.0–100.0)
PLATELETS: 260 10*3/uL (ref 150–440)
RBC: 3.73 MIL/uL — ABNORMAL LOW (ref 3.80–5.20)
RDW: 13 % (ref 11.5–14.5)
WBC: 7.4 10*3/uL (ref 3.6–11.0)

## 2017-01-07 LAB — ABO/RH: ABO/RH(D): O NEG

## 2017-01-07 SURGERY — DILATION AND EVACUATION, UTERUS
Anesthesia: General | Wound class: Clean Contaminated

## 2017-01-07 MED ORDER — LIDOCAINE HCL (PF) 2 % IJ SOLN
INTRAMUSCULAR | Status: AC
  Filled 2017-01-07: qty 2

## 2017-01-07 MED ORDER — FENTANYL CITRATE (PF) 100 MCG/2ML IJ SOLN
INTRAMUSCULAR | Status: AC
  Filled 2017-01-07: qty 2

## 2017-01-07 MED ORDER — ONDANSETRON HCL 4 MG/2ML IJ SOLN: INTRAMUSCULAR | Status: DC | PRN

## 2017-01-07 MED ORDER — GLYCOPYRROLATE 0.2 MG/ML IJ SOLN
INTRAMUSCULAR | Status: DC | PRN
  Administered 2017-01-07: 0.2 mg via INTRAVENOUS

## 2017-01-07 MED ORDER — FENTANYL CITRATE (PF) 100 MCG/2ML IJ SOLN
25.0000 ug | INTRAMUSCULAR | Status: AC | PRN
  Administered 2017-01-07 (×6): 25 ug via INTRAVENOUS

## 2017-01-07 MED ORDER — ROCURONIUM BROMIDE 50 MG/5ML IV SOLN
INTRAVENOUS | Status: AC
  Filled 2017-01-07: qty 1

## 2017-01-07 MED ORDER — ONDANSETRON HCL 4 MG/2ML IJ SOLN
INTRAMUSCULAR | Status: DC | PRN
  Administered 2017-01-07: 4 mg via INTRAVENOUS

## 2017-01-07 MED ORDER — PROPOFOL 10 MG/ML IV BOLUS
INTRAVENOUS | Status: AC
  Filled 2017-01-07: qty 20

## 2017-01-07 MED ORDER — MIDAZOLAM HCL 2 MG/2ML IJ SOLN
INTRAMUSCULAR | Status: AC
  Filled 2017-01-07: qty 2

## 2017-01-07 MED ORDER — FAMOTIDINE 20 MG PO TABS
ORAL_TABLET | ORAL | Status: AC
  Administered 2017-01-07: 20 mg via ORAL
  Filled 2017-01-07: qty 1

## 2017-01-07 MED ORDER — ACETAMINOPHEN NICU IV SYRINGE 10 MG/ML
INTRAVENOUS | Status: AC
  Filled 2017-01-07: qty 1

## 2017-01-07 MED ORDER — LACTATED RINGERS IV SOLN: INTRAVENOUS | Status: DC

## 2017-01-07 MED ORDER — LIDOCAINE HCL (CARDIAC) 20 MG/ML IV SOLN
INTRAVENOUS | Status: DC | PRN
  Administered 2017-01-07: 80 mg via INTRAVENOUS

## 2017-01-07 MED ORDER — OXYTOCIN 10 UNIT/ML IJ SOLN
INTRAMUSCULAR | Status: DC | PRN
  Administered 2017-01-07: 20 [IU] via INTRAMUSCULAR

## 2017-01-07 MED ORDER — ONDANSETRON HCL 4 MG/2ML IJ SOLN: 4.0000 mg | Freq: Once | INTRAMUSCULAR | Status: DC | PRN

## 2017-01-07 MED ORDER — LACTATED RINGERS IV SOLN
INTRAVENOUS | Status: DC
  Administered 2017-01-07: 11:00:00 via INTRAVENOUS

## 2017-01-07 MED ORDER — OXYTOCIN 10 UNIT/ML IJ SOLN
INTRAMUSCULAR | Status: AC
  Filled 2017-01-07: qty 2

## 2017-01-07 MED ORDER — MIDAZOLAM HCL 2 MG/2ML IJ SOLN
INTRAMUSCULAR | Status: DC | PRN
  Administered 2017-01-07: 2 mg via INTRAVENOUS

## 2017-01-07 MED ORDER — DEXAMETHASONE SODIUM PHOSPHATE 10 MG/ML IJ SOLN
INTRAMUSCULAR | Status: DC | PRN
  Administered 2017-01-07: 10 mg via INTRAVENOUS

## 2017-01-07 MED ORDER — OXYTOCIN 40 UNITS IN LACTATED RINGERS INFUSION - SIMPLE MED
20.0000 [IU] | INTRAVENOUS | Status: DC
  Administered 2017-01-07: 20 [IU] via INTRAVENOUS

## 2017-01-07 MED ORDER — DEXAMETHASONE SODIUM PHOSPHATE 10 MG/ML IJ SOLN
INTRAMUSCULAR | Status: AC
  Filled 2017-01-07: qty 1

## 2017-01-07 MED ORDER — FAMOTIDINE 20 MG PO TABS
20.0000 mg | ORAL_TABLET | Freq: Once | ORAL | Status: AC
  Administered 2017-01-07: 20 mg via ORAL

## 2017-01-07 MED ORDER — ONDANSETRON HCL 4 MG/2ML IJ SOLN
INTRAMUSCULAR | Status: AC
  Filled 2017-01-07: qty 2

## 2017-01-07 MED ORDER — FENTANYL CITRATE (PF) 100 MCG/2ML IJ SOLN
INTRAMUSCULAR | Status: DC | PRN
  Administered 2017-01-07 (×4): 25 ug via INTRAVENOUS

## 2017-01-07 SURGICAL SUPPLY — 23 items
ADAPTER VACURETTE TBG SET 14 (CANNULA) ×2 IMPLANT
CATH ROBINSON RED A/P 16FR (CATHETERS) ×2 IMPLANT
DRSG TELFA 3X8 NADH (GAUZE/BANDAGES/DRESSINGS) ×2 IMPLANT
FILTER UTR ASPR SPEC (MISCELLANEOUS) ×1 IMPLANT
FLTR UTR ASPR SPEC (MISCELLANEOUS) ×2
GLOVE ORTHO TXT STRL SZ7.5 (GLOVE) ×2 IMPLANT
GOWN STRL REUS W/ TWL LRG LVL3 (GOWN DISPOSABLE) ×1 IMPLANT
GOWN STRL REUS W/TWL LRG LVL3 (GOWN DISPOSABLE) ×1
KIT BERKELEY 1ST TRIMESTER 3/8 (MISCELLANEOUS) ×2 IMPLANT
KIT RM TURNOVER STRD PROC AR (KITS) ×2 IMPLANT
NEEDLE HYPO 25X1 1.5 SAFETY (NEEDLE) ×2 IMPLANT
PACK DNC HYST (MISCELLANEOUS) ×2 IMPLANT
PAD OB MATERNITY 4.3X12.25 (PERSONAL CARE ITEMS) ×2 IMPLANT
PAD PREP 24X41 OB/GYN DISP (PERSONAL CARE ITEMS) ×2 IMPLANT
SET BERKELEY SUCTION TUBING (SUCTIONS) IMPLANT
SOL PREP PVP 2OZ (MISCELLANEOUS) ×2
SOLUTION PREP PVP 2OZ (MISCELLANEOUS) ×1 IMPLANT
SPONGE XRAY 4X4 16PLY STRL (MISCELLANEOUS) ×2 IMPLANT
VACURETTE 10 RIGID CVD (CANNULA) ×2 IMPLANT
VACURETTE 12 RIGID CVD (CANNULA) ×2 IMPLANT
VACURETTE 7MM F TIP (CANNULA) ×1
VACURETTE 7MM F TIP STRL (CANNULA) ×1 IMPLANT
VACURETTE 8 RIGID CVD (CANNULA) IMPLANT

## 2017-01-07 NOTE — Anesthesia Postprocedure Evaluation (Signed)
Anesthesia Post Note  Patient: Holly Meyer  Procedure(s) Performed: Procedure(s) (LRB): DILATATION AND EVACUATION (N/A)  Patient location during evaluation: PACU Anesthesia Type: General Level of consciousness: awake and alert Pain management: pain level controlled Vital Signs Assessment: post-procedure vital signs reviewed and stable Respiratory status: spontaneous breathing, nonlabored ventilation, respiratory function stable and patient connected to nasal cannula oxygen Cardiovascular status: blood pressure returned to baseline and stable Postop Assessment: no signs of nausea or vomiting Anesthetic complications: no     Last Vitals:  Vitals:   01/07/17 1528 01/07/17 1531  BP:  116/76  Pulse: 86 88  Resp: 15 15  Temp:      Last Pain:  Vitals:   01/07/17 1531  TempSrc:   PainSc: 7                  , S

## 2017-01-07 NOTE — Transfer of Care (Signed)
Immediate Anesthesia Transfer of Care Note  Patient: Holly Meyer  Procedure(s) Performed: Procedure(s): DILATATION AND EVACUATION (N/A)  Patient Location: PACU  Anesthesia Type:General  Level of Consciousness: sedated  Airway & Oxygen Therapy: Patient Spontanous Breathing and Patient connected to face mask oxygen  Post-op Assessment: Report given to RN and Post -op Vital signs reviewed and stable  Post vital signs: Reviewed and stable  Last Vitals:  Vitals:   01/07/17 1030 01/07/17 1501  BP: 128/90 103/71  Pulse: 80 (!) 59  Resp: 16 11  Temp: 36.4 C 36.3 C    Last Pain:  Vitals:   01/07/17 1030  TempSrc: Oral  PainSc: 5          Complications: No apparent anesthesia complications

## 2017-01-07 NOTE — Discharge Instructions (Signed)

## 2017-01-07 NOTE — H&P (View-Only) (Signed)
PRE-OPERATIVE HISTORY AND PHYSICAL EXAM  PCP:  Sheral Flow, NP Subjective:   HPI:  Holly Meyer is a 21 y.o. G1P0000.  Patient's last menstrual period was 10/28/2016 (exact date).  She presents today for a pre-op discussion and PE.  She has the following symptoms:  She presents today After having an ultrasound which revealed a fetal pole without heartbeat. There's been no growth since her last ultrasound a week ago. She had spotting earlier this week but this has resolved. She has negative blood type and received RhoGAM within a week.  Review of Systems:   Constitutional: Denied constitutional symptoms, night sweats, recent illness, fatigue, fever, insomnia and weight loss.  Eyes: Denied eye symptoms, eye pain, photophobia, vision change and visual disturbance.  Ears/Nose/Throat/Neck: Denied ear, nose, throat or neck symptoms, hearing loss, nasal discharge, sinus congestion and sore throat.  Cardiovascular: Denied cardiovascular symptoms, arrhythmia, chest pain/pressure, edema, exercise intolerance, orthopnea and palpitations.  Respiratory: Denied pulmonary symptoms, asthma, pleuritic pain, productive sputum, cough, dyspnea and wheezing.  Gastrointestinal: Denied, gastro-esophageal reflux, melena, nausea and vomiting.  Genitourinary: Denied genitourinary symptoms including symptomatic vaginal discharge, pelvic relaxation issues, and urinary complaints.  Musculoskeletal: Denied musculoskeletal symptoms, stiffness, swelling, muscle weakness and myalgia.  Dermatologic: Denied dermatology symptoms, rash and scar.  Neurologic: Denied neurology symptoms, dizziness, headache, neck pain and syncope.  Psychiatric: Denied psychiatric symptoms, anxiety and depression.  Endocrine: Denied endocrine symptoms including hot flashes and night sweats.   OB History  Gravida Para Term Preterm AB Living  1 0 0 0 0 0  SAB TAB Ectopic Multiple Live Births  0 0 0 0      # Outcome Date GA  Lbr Len/2nd Weight Sex Delivery Anes PTL Lv  1 Current               Past Medical History:  Diagnosis Date  . Septate uterus   . Venous malformation    Right paraspinal    Past Surgical History:  Procedure Laterality Date  . WISDOM TOOTH EXTRACTION  2012      SOCIAL HISTORY: History  Smoking Status  . Former Smoker  . Packs/day: 0.25  . Types: Cigarettes  Smokeless Tobacco  . Never Used    Comment: recently quit   History  Alcohol Use  . 0.0 oz/week    Comment: socially   History  Drug Use No    Family History  Problem Relation Age of Onset  . Hypertension Father   . Migraines Mother   . Diabetes Paternal Grandmother     family history  . Heart failure Paternal Grandfather     ALLERGIES:  Fexofenadine; Imitrex [sumatriptan]; Cetirizine hcl; Guaifenesin; Guaifenesin er; Meloxicam; Naproxen; and Zyrtec  [cetirizine]  MEDS:   Current Outpatient Prescriptions on File Prior to Visit  Medication Sig Dispense Refill  . Prenatal Vit-Fe Fumarate-FA (MULTIVITAMIN-PRENATAL) 27-0.8 MG TABS tablet Take 1 tablet by mouth daily at 12 noon.     No current facility-administered medications on file prior to visit.     Meds ordered this encounter  Medications  . DISCONTD: Abby Potash Immune Globulin SOSY 1,500 Units     Physical examination BP 112/72   Pulse 78   Wt 153 lb 7 oz (69.6 kg)   LMP 10/28/2016 (Exact Date) Comment: irregular bleeding  BMI 24.03 kg/m   General NAD, Conversant  HEENT Atraumatic; Op clear with mmm.  Normo-cephalic. Pupils reactive. Anicteric sclerae  Thyroid/Neck Smooth without nodularity or enlargement. Normal  ROM.  Neck Supple.  Skin No rashes, lesions or ulceration. Normal palpated skin turgor. No nodularity.  Breasts: Deferred   Lungs: Clear to auscultation.No rales or wheezes. Normal Respiratory effort, no retractions.  Heart: NSR.  No murmurs or rubs appreciated. No periferal edema  Abdomen: Soft.  Non-tender.  No masses.  No HSM. No  hernia  Extremities: Moves all appropriately.  Normal ROM for age. No lymphadenopathy.  Neuro: Oriented to PPT.  Normal mood. Normal affect.     Pelvic:   Vulva: Normal appearance.  No lesions.  Vagina: No lesions or abnormalities noted.  Support: Normal pelvic support.  Urethra No masses tenderness or scarring.  Meatus Normal size without lesions or prolapse.  Cervix: Normal ectropion.  No lesions.  Anus: Normal exam.  No lesions.  Perineum: Normal exam.  No lesions.        Bimanual   Uterus:   8 wks size Non-tender.  Mobile.  AV.  Adnexae: No masses.  Non-tender to palpation.  Cul-de-sac: Negative for abnormality.   Assessment:   G1P0000 Patient Active Problem List   Diagnosis Date Noted  . Allergic rhinitis 02/28/2016  . Headache, migraine 08/02/2015  . Encounter for initial prescription of contraceptive pills 04/28/2015  . Back pain 04/28/2015  . Chronic pain syndrome 08/19/2013  . Venous malformation 02/28/2012    1. Missed ab      Plan:   1.  Dilation and evacuation for missed AB   Finis Bud, M.D. 01/03/2017 11:46 AM

## 2017-01-07 NOTE — Anesthesia Procedure Notes (Signed)
Procedure Name: LMA Insertion Date/Time: 01/07/2017 2:29 PM Performed by: Hedda Slade Pre-anesthesia Checklist: Patient identified, Patient being monitored, Timeout performed, Emergency Drugs available and Suction available Patient Re-evaluated:Patient Re-evaluated prior to inductionOxygen Delivery Method: Circle system utilized Preoxygenation: Pre-oxygenation with 100% oxygen Intubation Type: IV induction Ventilation: Mask ventilation without difficulty LMA: LMA inserted LMA Size: 3.5 Tube type: Oral Number of attempts: 1 Placement Confirmation: positive ETCO2 and breath sounds checked- equal and bilateral Tube secured with: Tape Dental Injury: Teeth and Oropharynx as per pre-operative assessment

## 2017-01-07 NOTE — Interval H&P Note (Signed)
History and Physical Interval Note:  01/07/2017 12:28 PM  Holly Meyer  has presented today for surgery, with the diagnosis of MISSED AB  The various methods of treatment have been discussed with the patient and family. After consideration of risks, benefits and other options for treatment, the patient has consented to  Procedure(s): DILATATION AND EVACUATION (N/A) as a surgical intervention .  The patient's history has been reviewed, patient examined, no change in status, stable for surgery.  I have reviewed the patient's chart and labs.  Questions were answered to the patient's satisfaction.     Jeannie Fend

## 2017-01-07 NOTE — Anesthesia Post-op Follow-up Note (Cosign Needed)
Anesthesia QCDR form completed.        

## 2017-01-07 NOTE — Op Note (Signed)
   OPERATIVE NOTE 01/07/2017 3:03 PM  PRE-OPERATIVE DIAGNOSIS:  1) MISSED AB  POST-OPERATIVE DIAGNOSIS:  1) same  OPERATION:  D&E  SURGEON(S): Surgeon(s) and Role:    * Harlin Heys, MD - Primary   ANESTHESIA: General  ESTIMATED BLOOD LOSS: 20ml OPERATIVE FINDINGS: POC intrauterine with open cervix  SPECIMEN:  ID Type Source Tests Collected by Time Destination  1 : Products of conception Products of Conception [facility] POC SURGICAL PATHOLOGY Harlin Heys, MD 10/[birth year]65 0141     COMPLICATIONS: None  DRAINS: Foley to gravity  DISPOSITION: Stable to recovery room  DESCRIPTION OF PROCEDURE:      The patient was prepped and draped in the dorsal lithotomy position and placed under general anesthesia. Her cervix was grasped with a Jacob's tenaculum. Respecting the position and curvature of her cervix, it was dilated to accommodate a number 10 suction curette. The suction curette was placed within the endometrial cavity and a pressure greater than 65 mmHg was allowed to build. A systematic curettage was performed in all quadrants until no additional tissue was noted. The uterus became firm and globular. Pitocin was run in the IV. The tenaculum was removed from the cervix and hemostasis was noted. The weighted speculum was removed and the patient went to recovery room in stable condition.  1 wk f/u   Finis Bud, M.D. 01/07/2017 3:03 PM

## 2017-01-07 NOTE — Anesthesia Preprocedure Evaluation (Signed)
Anesthesia Evaluation  Patient identified by MRN, date of birth, ID band Patient awake    Reviewed: Allergy & Precautions, NPO status , Patient's Chart, lab work & pertinent test results, reviewed documented beta blocker date and time   History of Anesthesia Complications (+) history of anesthetic complications  Airway Mallampati: II  TM Distance: >3 FB     Dental  (+) Chipped   Pulmonary former smoker,           Cardiovascular      Neuro/Psych  Headaches,    GI/Hepatic   Endo/Other    Renal/GU      Musculoskeletal   Abdominal   Peds  Hematology   Anesthesia Other Findings   Reproductive/Obstetrics                             Anesthesia Physical Anesthesia Plan  ASA: III  Anesthesia Plan: General   Post-op Pain Management:    Induction: Intravenous  Airway Management Planned: LMA  Additional Equipment:   Intra-op Plan:   Post-operative Plan:   Informed Consent: I have reviewed the patients History and Physical, chart, labs and discussed the procedure including the risks, benefits and alternatives for the proposed anesthesia with the patient or authorized representative who has indicated his/her understanding and acceptance.     Plan Discussed with: CRNA  Anesthesia Plan Comments:         Anesthesia Quick Evaluation

## 2017-01-08 ENCOUNTER — Encounter: Payer: Self-pay | Admitting: Obstetrics and Gynecology

## 2017-01-08 LAB — BPAM RBC
BLOOD PRODUCT EXPIRATION DATE: 201804242359
Blood Product Expiration Date: 201804182359
UNIT TYPE AND RH: 9500
UNIT TYPE AND RH: 9500

## 2017-01-08 LAB — TYPE AND SCREEN
ABO/RH(D): O NEG
ANTIBODY SCREEN: POSITIVE
UNIT DIVISION: 0
Unit division: 0

## 2017-01-09 LAB — SURGICAL PATHOLOGY

## 2017-01-15 ENCOUNTER — Encounter: Payer: Self-pay | Admitting: Obstetrics and Gynecology

## 2017-01-15 ENCOUNTER — Ambulatory Visit (INDEPENDENT_AMBULATORY_CARE_PROVIDER_SITE_OTHER): Payer: BLUE CROSS/BLUE SHIELD | Admitting: Obstetrics and Gynecology

## 2017-01-15 VITALS — BP 114/75 | HR 70 | Wt 151.2 lb

## 2017-01-15 DIAGNOSIS — Z9889 Other specified postprocedural states: Secondary | ICD-10-CM

## 2017-01-15 DIAGNOSIS — O021 Missed abortion: Secondary | ICD-10-CM

## 2017-01-15 NOTE — Progress Notes (Signed)
HPI:      [name] is a 21 y.o. G1P0000 who LMP was Patient's last menstrual period was 10/28/2016 (exact date).  Subjective:   She presents today 1 week from D&E for missed AB. She is doing well. Having a small amount of spotting. Does not desire birth control would like to conceive next 3 months.    Hx: The following portions of the patient's history were reviewed and updated as appropriate:              She  has a past medical history of Complication of anesthesia; Septate uterus; and Venous malformation. She  has a past surgical history that includes Wisdom tooth extraction (2012) and Dilation and evacuation (N/A, 01/07/2017). Current Outpatient Prescriptions on File Prior to Visit  Medication Sig Dispense Refill  . Prenatal Vit-Fe Fumarate-FA (MULTIVITAMIN-PRENATAL) 27-0.8 MG TABS tablet Take 1 tablet by mouth daily at 12 noon.     No current facility-administered medications on file prior to visit.          Review of Systems:  Review of Systems  Constitutional: Denied constitutional symptoms, night sweats, recent illness, fatigue, fever, insomnia and weight loss.  Eyes: Denied eye symptoms, eye pain, photophobia, vision change and visual disturbance.  Ears/Nose/Throat/Neck: Denied ear, nose, throat or neck symptoms, hearing loss, nasal discharge, sinus congestion and sore throat.  Cardiovascular: Denied cardiovascular symptoms, arrhythmia, chest pain/pressure, edema, exercise intolerance, orthopnea and palpitations.  Respiratory: Denied pulmonary symptoms, asthma, pleuritic pain, productive sputum, cough, dyspnea and wheezing.  Gastrointestinal: Denied, gastro-esophageal reflux, melena, nausea and vomiting.  Genitourinary: Denied genitourinary symptoms including symptomatic vaginal discharge, pelvic relaxation issues, and urinary complaints.  Musculoskeletal: Denied musculoskeletal symptoms, stiffness, swelling, muscle weakness and myalgia.  Dermatologic: Denied  dermatology symptoms, rash and scar.  Neurologic: Denied neurology symptoms, dizziness, headache, neck pain and syncope.  Psychiatric: Denied psychiatric symptoms, anxiety and depression.  Endocrine: Denied endocrine symptoms including hot flashes and night sweats.   Meds:   Current Outpatient Prescriptions on File Prior to Visit  Medication Sig Dispense Refill  . Prenatal Vit-Fe Fumarate-FA (MULTIVITAMIN-PRENATAL) 27-0.8 MG TABS tablet Take 1 tablet by mouth daily at 12 noon.     No current facility-administered medications on file prior to visit.     Objective:     Vitals:   01/15/17 0923  BP: 114/75  Pulse: 70              Pathology findings reviewed and discussed with the patient  Assessment:    G1P0000 Patient Active Problem List   Diagnosis Date Noted  . Allergic rhinitis 02/28/2016  . Headache, migraine 08/02/2015  . Encounter for initial prescription of contraceptive pills 04/28/2015  . Back pain 04/28/2015  . Chronic pain syndrome 08/19/2013  . Venous malformation 02/28/2012     1. Post-operative state   2. Missed ab     Patient doing well postop without issue. Desires fertility this year.   Plan:            1.  We have discussed the timing of intercourse for pregnancy after a miscarriage. We have discussed the risks of miscarriage going forward. I have advised her to continue prenatal vitamins. I have answered all of her questions. She will follow-up for her annual examination within the next month.   F/U  Return in about 4 weeks (around 02/12/2017). I spent 15 minutes with this patient of which greater than 50% was spent discussing miscarriage, future fertility, possible birth control.  Finis Bud, M.D. 01/15/2017 10:14 AM

## 2017-01-23 ENCOUNTER — Encounter: Payer: BLUE CROSS/BLUE SHIELD | Admitting: Obstetrics and Gynecology

## 2017-02-01 ENCOUNTER — Encounter: Payer: Self-pay | Admitting: Primary Care

## 2017-02-01 ENCOUNTER — Ambulatory Visit (INDEPENDENT_AMBULATORY_CARE_PROVIDER_SITE_OTHER): Payer: BLUE CROSS/BLUE SHIELD | Admitting: Primary Care

## 2017-02-01 VITALS — BP 124/84 | HR 73 | Temp 98.5°F | Ht 67.0 in | Wt 150.1 lb

## 2017-02-01 DIAGNOSIS — G43901 Migraine, unspecified, not intractable, with status migrainosus: Secondary | ICD-10-CM | POA: Diagnosis not present

## 2017-02-01 DIAGNOSIS — Z23 Encounter for immunization: Secondary | ICD-10-CM | POA: Diagnosis not present

## 2017-02-01 DIAGNOSIS — Z0000 Encounter for general adult medical examination without abnormal findings: Secondary | ICD-10-CM | POA: Diagnosis not present

## 2017-02-01 DIAGNOSIS — Q279 Congenital malformation of peripheral vascular system, unspecified: Secondary | ICD-10-CM

## 2017-02-01 NOTE — Addendum Note (Signed)
Addended by: Jacqualin Combes on: 02/01/2017 04:08 PM   Modules accepted: Orders

## 2017-02-01 NOTE — Patient Instructions (Signed)
You were provided with a tetanus vaccination that will cover you for 10 years.   I will be in touch with a radiologist regarding when to scan you next for the venous malformation.  Continue prenatal vitamins.  It's important to improve your diet by reducing consumption of fast food, fried food, processed snack foods, sugary drinks. Increase consumption of fresh vegetables and fruits, whole grains, water.  Ensure you are drinking 64 ounces of water daily.  Start exercising. You should be getting 150 minutes of moderate intensity exercise weekly.  Follow up in 1 year for annual exam or sooner if needed.  It was a pleasure to see you today!

## 2017-02-01 NOTE — Progress Notes (Signed)
Pre visit review using our clinic review tool, if applicable. No additional management support is needed unless otherwise documented below in the visit note. 

## 2017-02-01 NOTE — Progress Notes (Signed)
Subjective:    Patient ID: Holly Meyer, female    DOB: 03/14/1996, 21 y.o.   MRN: [id number]  HPI  Holly Meyer is a 21 year old female who presents today for complete physical.  Immunizations: -Tetanus: Unsure. -Influenza: Did not complete last season.   Diet: She endorses a fair diet. Breakfast: Fruit Lunch: Fast food  Dinner: Chicken, pasta, vegetables Snacks: Occasionally Desserts: Occasionally Beverages: Sweet tea, juice, water  Exercise: She is not currently exercising Eye exam: Completed in 2018 Dental exam: Completes annually Pap Smear: Following with GYN.    Review of Systems  Constitutional: Negative for unexpected weight change.  HENT: Negative for rhinorrhea.   Respiratory: Negative for cough and shortness of breath.   Cardiovascular: Negative for chest pain.  Gastrointestinal: Negative for constipation and diarrhea.  Genitourinary: Negative for difficulty urinating and menstrual problem.  Musculoskeletal: Negative for arthralgias and myalgias.  Skin: Negative for rash.  Allergic/Immunologic: Negative for environmental allergies.  Neurological: Negative for dizziness, numbness and headaches.  Psychiatric/Behavioral:       Has had some anxiety about recent miscarriage, overall manages well on her own.       Past Medical History:  Diagnosis Date  . Complication of anesthesia    complaints of nausea and vomitting when wisdom teeth were removed  . Septate uterus   . Venous malformation    Right paraspinal     Social History   Social History  . Marital status: Single    Spouse name: N/A  . Number of children: N/A  . Years of education: N/A   Occupational History  . Not on file.   Social History Main Topics  . Smoking status: Former Smoker    Packs/day: 0.25    Types: Cigarettes  . Smokeless tobacco: Never Used     Comment: recently quit  . Alcohol use 0.0 oz/week     Comment: socially  . Drug use: No  . Sexual activity: Yes   Partners: Male    Birth control/ protection: None   Other Topics Concern  . Not on file   Social History Narrative   Single.   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   Works at [employer].    Enjoys swimming, watching movies.    Past Surgical History:  Procedure Laterality Date  . DILATION AND EVACUATION N/A 01/07/2017   Procedure: DILATATION AND EVACUATION;  Surgeon: Harlin Heys, MD;  Location: [facility] ORS;  Service: Gynecology;  Laterality: N/A;  . WISDOM TOOTH EXTRACTION  2012    Family History  Problem Relation Age of Onset  . Hypertension Father   . Migraines Mother   . Diabetes Paternal Grandmother     family history  . Heart failure Paternal Grandfather     Allergies  Allergen Reactions  . Fexofenadine Shortness Of Breath    Other reaction(s): Respiratory Distress (ALLERGY/intolerance)  . Imitrex [Sumatriptan] Other (See Comments)    Symptoms worsened  . Cetirizine Hcl   . Guaifenesin     Other reaction(s): Dizziness (intolerance)  . Guaifenesin Er Other (See Comments)  . Meloxicam Other (See Comments)  . Naproxen Other (See Comments)    Loss of appiteite  . Zyrtec  [Cetirizine]     Current Outpatient Prescriptions on File Prior to Visit  Medication Sig Dispense Refill  . Prenatal Vit-Fe Fumarate-FA (MULTIVITAMIN-PRENATAL) 27-0.8 MG TABS tablet Take 1 tablet by mouth daily at 12 noon.  No current facility-administered medications on file prior to visit.     BP 124/84   Pulse 73   Temp 98.5 F (36.9 C) (Oral)   Ht 5\' 7"  (1.702 m)   Wt 150 lb 1.9 oz (68.1 kg)   LMP 10/28/2016 (Exact Date) Comment: irregular bleeding  SpO2 99%   Breastfeeding? Unknown   BMI 23.51 kg/m    Objective:   Physical Exam  Constitutional: She is oriented to person, place, and time. She appears well-nourished.  HENT:  Right Ear: Tympanic membrane and ear canal normal.  Left Ear: Tympanic membrane  and ear canal normal.  Nose: Nose normal.  Mouth/Throat: Oropharynx is clear and moist.  Eyes: Conjunctivae and EOM are normal. Pupils are equal, round, and reactive to light.  Neck: Neck supple. No thyromegaly present.  Cardiovascular: Normal rate and regular rhythm.   No murmur heard. Pulmonary/Chest: Effort normal and breath sounds normal. She has no rales.  Abdominal: Soft. Bowel sounds are normal. There is no tenderness.  Musculoskeletal: Normal range of motion.  Lymphadenopathy:    She has no cervical adenopathy.  Neurological: She is alert and oriented to person, place, and time. She has normal reflexes. No cranial nerve deficit.  Skin: Skin is warm and dry. No rash noted.  Psychiatric: She has a normal mood and affect.          Assessment & Plan:

## 2017-02-01 NOTE — Assessment & Plan Note (Addendum)
Tdap due, provided today. Pap due, follows with GYN. Discussed to increase vegetables, fruit, whole grains. Start exercising. Exam unremarkable. Labs pending. Continue prenatal vitamins. Follow up in 1 year.

## 2017-02-01 NOTE — Assessment & Plan Note (Signed)
Last scan was in 2016, will check with radiology regarding when to scan next. She has no complaints.

## 2017-02-01 NOTE — Assessment & Plan Note (Addendum)
Has noticed return in daily headaches since miscarriage, overall tolerable, continue to monitor. Tylenol PRN.

## 2017-02-05 ENCOUNTER — Telehealth: Payer: Self-pay | Admitting: Primary Care

## 2017-02-05 NOTE — Telephone Encounter (Signed)
Please notify patient that I spoke with a radiologist who recommend we repeat the MRI every 2-3 years. This November will count as two years since her last scan, so I recommend we repeat this at that time. Let me know if she's agreeable and we will send a reminder for the scan in November.

## 2017-02-05 NOTE — Telephone Encounter (Signed)
Message left for patient to return my call.  

## 2017-02-08 ENCOUNTER — Encounter: Payer: Self-pay | Admitting: Primary Care

## 2017-02-08 NOTE — Telephone Encounter (Addendum)
Spoken to patient and she is agreeable to the MRI. However, if she get pregnant again then it will be hold off. Would that be okay.

## 2017-02-08 NOTE — Telephone Encounter (Signed)
Spoke with patient as well. See My Chart messages.

## 2017-04-19 ENCOUNTER — Encounter: Payer: Self-pay | Admitting: Primary Care

## 2017-05-05 ENCOUNTER — Encounter: Payer: Self-pay | Admitting: Primary Care

## 2017-05-06 ENCOUNTER — Encounter: Payer: Self-pay | Admitting: Primary Care

## 2017-05-06 DIAGNOSIS — Q279 Congenital malformation of peripheral vascular system, unspecified: Secondary | ICD-10-CM

## 2017-05-07 ENCOUNTER — Telehealth: Payer: Self-pay | Admitting: Primary Care

## 2017-05-07 NOTE — Telephone Encounter (Signed)
Noted, MRI cancelled.

## 2017-05-07 NOTE — Telephone Encounter (Signed)
Its better for you to cancel the MRI order right now and reorder when the patient is ready to have it. Pls cancel order.

## 2017-05-07 NOTE — Telephone Encounter (Signed)
Called the patient to set up followup MRI and the patient doesn't have insurance right now. She said she was going to send you a message about when she wants to have the MRI. She may want to wait until the fall when her insurance is established and she is covered. Please advise after patient decides when she wants the MRI.

## 2017-05-07 NOTE — Telephone Encounter (Signed)
Noted, do I need to cancel the MRI or can I leave the order open?

## 2017-07-16 ENCOUNTER — Ambulatory Visit: Payer: Self-pay | Admitting: Primary Care

## 2017-08-05 ENCOUNTER — Other Ambulatory Visit: Payer: Self-pay

## 2017-08-05 ENCOUNTER — Ambulatory Visit (INDEPENDENT_AMBULATORY_CARE_PROVIDER_SITE_OTHER): Payer: Medicaid Other | Admitting: Obstetrics and Gynecology

## 2017-08-05 ENCOUNTER — Encounter: Payer: Self-pay | Admitting: Primary Care

## 2017-08-05 VITALS — BP 128/84 | HR 80 | Ht 67.0 in | Wt 159.7 lb

## 2017-08-05 DIAGNOSIS — Z3687 Encounter for antenatal screening for uncertain dates: Secondary | ICD-10-CM

## 2017-08-05 DIAGNOSIS — Z113 Encounter for screening for infections with a predominantly sexual mode of transmission: Secondary | ICD-10-CM

## 2017-08-05 DIAGNOSIS — Z8489 Family history of other specified conditions: Secondary | ICD-10-CM | POA: Diagnosis not present

## 2017-08-05 DIAGNOSIS — Z3481 Encounter for supervision of other normal pregnancy, first trimester: Secondary | ICD-10-CM

## 2017-08-05 DIAGNOSIS — N926 Irregular menstruation, unspecified: Secondary | ICD-10-CM

## 2017-08-05 DIAGNOSIS — Z3201 Encounter for pregnancy test, result positive: Secondary | ICD-10-CM | POA: Diagnosis not present

## 2017-08-05 DIAGNOSIS — Z369 Encounter for antenatal screening, unspecified: Secondary | ICD-10-CM

## 2017-08-05 DIAGNOSIS — Z1389 Encounter for screening for other disorder: Secondary | ICD-10-CM

## 2017-08-05 LAB — POCT URINE PREGNANCY: PREG TEST UR: POSITIVE — AB

## 2017-08-05 MED ORDER — DOXYLAMINE-PYRIDOXINE 10-10 MG PO TBEC: 10.0000 mg | DELAYED_RELEASE_TABLET | Freq: Every day | ORAL | 1 refills | Status: DC

## 2017-08-05 MED ORDER — BONJESTA 20-20 MG PO TBCR: 1.0000 | EXTENDED_RELEASE_TABLET | Freq: Two times a day (BID) | ORAL | 1 refills | Status: DC

## 2017-08-05 NOTE — Patient Instructions (Signed)
Pregnancy and Zika Virus Disease Zika virus disease, or Zika, is an illness that can spread to people from mosquitoes that carry the virus. It may also spread from person to person through infected body fluids. Zika first occurred in [place], but recently it has spread to new areas. The virus occurs in tropical climates. The location of Zika continues to change. Most people who become infected with Zika virus do not develop serious illness. However, Zika may cause birth defects in an unborn baby whose mother is infected with the virus. It may also increase the risk of miscarriage. What are the symptoms of Zika virus disease? In many cases, people who have been infected with Zika virus do not develop any symptoms. If symptoms appear, they usually start about a week after the person is infected. Symptoms are usually mild. They may include:  Fever.  Rash.  Red eyes.  Joint pain.  How does Zika virus disease spread? The main way that [place] virus spreads is through the bite of a certain type of mosquito. Unlike most types of mosquitos, which bite only at night, the type of mosquito that carries Zika virus bites both at night and during the day. Zika virus can also spread through sexual contact, through a blood transfusion, and from a mother to her baby before or during birth. Once you have had Zika virus disease, it is unlikely that you will get it again. Can I pass Zika to my baby during pregnancy? Yes, Zika can pass from a mother to her baby before or during birth. What problems can Zika cause for my baby? A woman who is infected with Zika virus while pregnant is at risk of having her baby born with a condition in which the brain or head is smaller than expected (microcephaly). Babies who have microcephaly can have developmental delays, seizures, hearing problems, and vision problems. Having Zika virus disease during pregnancy can also increase the risk of miscarriage. How can Zika virus disease be  prevented? There is no vaccine to prevent Zika. The best way to prevent the disease is to avoid infected mosquitoes and avoid exposure to body fluids that can spread the virus. Avoid any possible exposure to [place] by taking the following precautions. For women and their sex partners:  Avoid traveling to high-risk areas. The locations where [place] is being reported change often. To identify high-risk areas, check the CDC travel website: CreditChaos.com.ee  If you or your sex partner must travel to a high-risk area, talk with a health care provider before and after traveling.  Take all precautions to avoid mosquito bites if you live in, or travel to, any of the high-risk areas. Insect repellents are safe to use during pregnancy.  Ask your health care provider when it is safe to have sexual contact.  For women:  If you are pregnant or trying to become pregnant, avoid sexual contact with persons who may have been exposed to [place] virus, persons who have possible symptoms of Zika, or persons whose history you are unsure about. If you choose to have sexual contact with someone who may have been exposed to [place] virus, use condoms correctly during the entire duration of sexual activity, every time. Do not share sexual devices, as you may be exposed to body fluids.  Ask your health care provider about when it is safe to attempt pregnancy after a possible exposure to [place] virus.  What steps should I take to avoid mosquito bites? Take these steps to avoid mosquito bites  when you are in a high-risk area:  Wear loose clothing that covers your arms and legs.  Limit your outdoor activities.  Do not open windows unless they have window screens.  Sleep under mosquito nets.  Use insect repellent. The best insect repellents have:  DEET, picaridin, oil of lemon eucalyptus (OLE), or IR3535 in them.  Higher amounts of an active ingredient in them.  Remember that insect repellents are safe to  use during pregnancy.  Do not use OLE on children who are younger than 6 years of age. Do not use insect repellent on babies who are younger than 2 months of age.  Cover your child's stroller with mosquito netting. Make sure the netting fits snugly and that any loose netting does not cover your child's mouth or nose. Do not use a blanket as a mosquito-protection cover.  Do not apply insect repellent underneath clothing.  If you are using sunscreen, apply the sunscreen before applying the insect repellent.  Treat clothing with permethrin. Do not apply permethrin directly to your skin. Follow label directions for safe use.  Get rid of standing water, where mosquitoes may reproduce. Standing water is often found in items such as buckets, bowls, animal food dishes, and flowerpots.  When you return from traveling to any high-risk area, continue taking actions to protect yourself against mosquito bites for 3 weeks, even if you show no signs of illness. This will prevent spreading Zika virus to uninfected mosquitoes. What should I know about the sexual transmission of Zika? People can spread Zika to their sexual partners during vaginal, anal, or oral sex, or by sharing sexual devices. Many people with [place] do not develop symptoms, so a person could spread the disease without knowing that they are infected. The greatest risk is to women who are pregnant or who may become pregnant. Zika virus can live longer in semen than it can live in blood. Couples can prevent sexual transmission of the virus by:  Using condoms correctly during the entire duration of sexual activity, every time. This includes vaginal, anal, and oral sex.  Not sharing sexual devices. Sharing increases your risk of being exposed to body fluid from another person.  Avoiding all sexual activity until your health care provider says it is safe.  Should I be tested for Zika virus? A sample of your blood can be tested for Zika virus. A  pregnant woman should be tested if she may have been exposed to the virus or if she has symptoms of Zika. She may also have additional tests done during her pregnancy, such ultrasound testing. Talk with your health care provider about which tests are recommended. This information is not intended to replace advice given to you by your health care provider. Make sure you discuss any questions you have with your health care provider. Document Released: 06/22/2015 Document Revised: 03/08/2016 Document Reviewed: 06/15/2015 Elsevier Interactive Patient Education  2018 [employer]. Hyperemesis Gravidarum Hyperemesis gravidarum is a severe form of nausea and vomiting that happens during pregnancy. Hyperemesis is worse than morning sickness. It may cause you to have nausea or vomiting all day for many days. It may keep you from eating and drinking enough food and liquids. Hyperemesis usually occurs during the first half (the first 20 weeks) of pregnancy. It often goes away once a woman is in her second half of pregnancy. However, sometimes hyperemesis continues through an entire pregnancy. What are the causes? The cause of this condition is not known. It may be related  to changes in chemicals (hormones) in the body during pregnancy, such as the high level of pregnancy hormone (human chorionic gonadotropin) or the increase in the female sex hormone (estrogen). What are the signs or symptoms? Symptoms of this condition include:  Severe nausea and vomiting.  Nausea that does not go away.  Vomiting that does not allow you to keep any food down.  Weight loss.  Body fluid loss (dehydration).  Having no desire to eat, or not liking food that you have previously enjoyed.  How is this diagnosed? This condition may be diagnosed based on:  A physical exam.  Your medical history.  Your symptoms.  Blood tests.  Urine tests.  How is this treated? This condition may be managed with medicine. If  medicines to do not help relieve nausea and vomiting, you may need to receive fluids through an IV tube at the hospital. Follow these instructions at home:  Take over-the-counter and prescription medicines only as told by your health care provider.  Avoid iron pills and multivitamins that contain iron for the first 3-4 months of pregnancy. If you take prescription iron pills, do not stop taking them unless your health care provider approves.  Take the following actions to help prevent nausea and vomiting: ? In the morning, before getting out of bed, try eating a couple of dry crackers or a piece of toast. ? Avoid foods and smells that upset your stomach. Fatty and spicy foods may make nausea worse. ? Eat 5-6 small meals a day. ? Do not drink fluids while eating meals. Drink between meals. ? Eat or suck on things that have ginger in them. Ginger can help relieve nausea. ? Avoid food preparation. The smell of food can spoil your appetite or trigger nausea.  Follow instructions from your health care provider about eating or drinking restrictions.  For snacks, eat high-protein foods, such as cheese.  Keep all follow-up and pre-birth (prenatal) visits as told by your health care provider. This is important. Contact a health care provider if:  You have pain in your abdomen.  You have a severe headache.  You have vision problems.  You are losing weight. Get help right away if:  You cannot drink fluids without vomiting.  You vomit blood.  You have constant nausea and vomiting.  You are very weak.  You are very thirsty.  You feel dizzy.  You faint.  You have a fever or other symptoms that last for more than 2-3 days.  You have a fever and your symptoms suddenly get worse. Summary  Hyperemesis gravidarum is a severe form of nausea and vomiting that happens during pregnancy.  Making some changes to your eating habits may help relieve nausea and vomiting.  This condition may  be managed with medicine.  If medicines to do not help relieve nausea and vomiting, you may need to receive fluids through an IV tube at the hospital. This information is not intended to replace advice given to you by your health care provider. Make sure you discuss any questions you have with your health care provider. Document Released: 10/01/2005 Document Revised: 05/30/2016 Document Reviewed: 05/30/2016 Elsevier Interactive Patient Education  2017 [place] of Pregnancy The first trimester of pregnancy is from week 1 until the end of week 13 (months 1 through 3). During this time, your baby will begin to develop inside you. At 6-8 weeks, the eyes and face are formed, and the heartbeat can be seen on ultrasound. At the  end of 12 weeks, all the baby's organs are formed. Prenatal care is all the medical care you receive before the birth of your baby. Make sure you get good prenatal care and follow all of your doctor's instructions. Follow these instructions at home: Medicines  Take over-the-counter and prescription medicines only as told by your doctor. Some medicines are safe and some medicines are not safe during pregnancy.  Take a prenatal vitamin that contains at least 600 micrograms (mcg) of folic acid.  If you have trouble pooping (constipation), take medicine that will make your stool soft (stool softener) if your doctor approves. Eating and drinking  Eat regular, healthy meals.  Your doctor will tell you the amount of weight gain that is right for you.  Avoid raw meat and uncooked cheese.  If you feel sick to your stomach (nauseous) or throw up (vomit): ? Eat 4 or 5 small meals a day instead of 3 large meals. ? Try eating a few soda crackers. ? Drink liquids between meals instead of during meals.  To prevent constipation: ? Eat foods that are high in fiber, like fresh fruits and vegetables, whole grains, and beans. ? Drink enough fluids to keep your pee  (urine) clear or pale yellow. Activity  Exercise only as told by your doctor. Stop exercising if you have cramps or pain in your lower belly (abdomen) or low back.  Do not exercise if it is too hot, too humid, or if you are in a place of great height (high altitude).  Try to avoid standing for long periods of time. Move your legs often if you must stand in one place for a long time.  Avoid heavy lifting.  Wear low-heeled shoes. Sit and stand up straight.  You can have sex unless your doctor tells you not to. Relieving pain and discomfort  Wear a good support bra if your breasts are sore.  Take warm water baths (sitz baths) to soothe pain or discomfort caused by hemorrhoids. Use hemorrhoid cream if your doctor says it is okay.  Rest with your legs raised if you have leg cramps or low back pain.  If you have puffy, bulging veins (varicose veins) in your legs: ? Wear support hose or compression stockings as told by your doctor. ? Raise (elevate) your feet for 15 minutes, 3-4 times a day. ? Limit salt in your food. Prenatal care  Schedule your prenatal visits by the twelfth week of pregnancy.  Write down your questions. Take them to your prenatal visits.  Keep all your prenatal visits as told by your doctor. This is important. Safety  Wear your seat belt at all times when driving.  Make a list of emergency phone numbers. The list should include numbers for family, friends, the hospital, and police and fire departments. General instructions  Ask your doctor for a referral to a local prenatal class. Begin classes no later than at the start of month 6 of your pregnancy.  Ask for help if you need counseling or if you need help with nutrition. Your doctor can give you advice or tell you where to go for help.  Do not use hot tubs, steam rooms, or saunas.  Do not douche or use tampons or scented sanitary pads.  Do not cross your legs for long periods of time.  Avoid all herbs  and alcohol. Avoid drugs that are not approved by your doctor.  Do not use any tobacco products, including cigarettes, chewing tobacco, and electronic cigarettes.  If you need help quitting, ask your doctor. You may get counseling or other support to help you quit.  Avoid cat litter boxes and soil used by cats. These carry germs that can cause birth defects in the baby and can cause a loss of your baby (miscarriage) or stillbirth.  Visit your dentist. At home, brush your teeth with a soft toothbrush. Be gentle when you floss. Contact a doctor if:  You are dizzy.  You have mild cramps or pressure in your lower belly.  You have a nagging pain in your belly area.  You continue to feel sick to your stomach, you throw up, or you have watery poop (diarrhea).  You have a bad smelling fluid coming from your vagina.  You have pain when you pee (urinate).  You have increased puffiness (swelling) in your face, hands, legs, or ankles. Get help right away if:  You have a fever.  You are leaking fluid from your vagina.  You have spotting or bleeding from your vagina.  You have very bad belly cramping or pain.  You gain or lose weight rapidly.  You throw up blood. It may look like coffee grounds.  You are around people who have [place] measles, fifth disease, or chickenpox.  You have a very bad headache.  You have shortness of breath.  You have any kind of trauma, such as from a fall or a car accident. Summary  The first trimester of pregnancy is from week 1 until the end of week 13 (months 1 through 3).  To take care of yourself and your unborn baby, you will need to eat healthy meals, take medicines only if your doctor tells you to do so, and do activities that are safe for you and your baby.  Keep all follow-up visits as told by your doctor. This is important as your doctor will have to ensure that your baby is healthy and growing well. This information is not intended to replace  advice given to you by your health care provider. Make sure you discuss any questions you have with your health care provider. Document Released: 03/19/2008 Document Revised: 10/09/2016 Document Reviewed: 10/09/2016 Elsevier Interactive Patient Education  2017 [place]. Commonly Asked Questions During Pregnancy  Cats: A parasite can be excreted in cat feces.  To avoid exposure you need to have another person empty the little box.  If you must empty the litter box you will need to wear gloves.  Wash your hands after handling your cat.  This parasite can also be found in raw or undercooked meat so this should also be avoided.  Colds, Sore Throats, Flu: Please check your medication sheet to see what you can take for symptoms.  If your symptoms are unrelieved by these medications please call the office.  Dental Work: Most any dental work [employer] banker, corporate recommends is permitted.  X-rays should only be taken during the first trimester if absolutely necessary.  Your abdomen should be shielded with a lead apron during all x-rays.  Please notify your provider prior to receiving any x-rays.  Novocaine is fine; gas is not recommended.  If your dentist requires a note from [place] prior to dental work please call the office and we will provide one for you.  Exercise: Exercise is an important part of staying healthy during your pregnancy.  You may continue most exercises you were accustomed to prior to pregnancy.  Later in your pregnancy you will most likely notice you have difficulty with activities  requiring balance like riding a bicycle.  It is important that you listen to your body and avoid activities that put you at a higher risk of falling.  Adequate rest and staying well hydrated are a must!  If you have questions about the safety of specific activities ask your provider.    Exposure to Children with illness: Try to avoid obvious exposure; report any symptoms to [place] when noted,  If you have chicken pos, red  measles or mumps, you should be immune to these diseases.   Please do not take any vaccines while pregnant unless you have checked with your OB provider.  Fetal Movement: After 28 weeks we recommend you do "kick counts" twice daily.  Lie or sit down in a calm quiet environment and count your baby movements "kicks".  You should feel your baby at least 10 times per hour.  If you have not felt 10 kicks within the first hour get up, walk around and have something sweet to eat or drink then repeat for an additional hour.  If count remains less than 10 per hour notify your provider.  Fumigating: Follow your pest control agent's advice as to how long to stay out of your home.  Ventilate the area well before re-entering.  Hemorrhoids:   Most over-the-counter preparations can be used during pregnancy.  Check your medication to see what is safe to use.  It is important to use a stool softener or fiber in your diet and to drink lots of liquids.  If hemorrhoids seem to be getting worse please call the office.   Hot Tubs:  Hot tubs Jacuzzis and saunas are not recommended while pregnant.  These increase your internal body temperature and should be avoided.  Intercourse:  Sexual intercourse is safe during pregnancy as long as you are comfortable, unless otherwise advised by your provider.  Spotting may occur after intercourse; report any bright red bleeding that is heavier than spotting.  Labor:  If you know that you are in labor, please go to the hospital.  If you are unsure, please call the office and let us help you decide what to do.  Lifting, straining, etc:  If your job requires heavy lifting or straining please check with your provider for any limitations.  Generally, you should not lift items heavier than that you can lift simply with your hands and arms (no back muscles)  Painting:  Paint fumes do not harm your pregnancy, but may make you ill and should be avoided if possible.  Latex or water based paints  have less odor than oils.  Use adequate ventilation while painting.  Permanents & Hair Color:  Chemicals in hair dyes are not recommended as they cause increase hair dryness which can increase hair loss during pregnancy.  " Highlighting" and permanents are allowed.  Dye may be absorbed differently and permanents may not hold as well during pregnancy.  Sunbathing:  Use a sunscreen, as skin burns easily during pregnancy.  Drink plenty of fluids; avoid over heating.  Tanning Beds:  Because their possible side effects are still unknown, tanning beds are not recommended.  Ultrasound Scans:  Routine ultrasounds are performed at approximately 20 weeks.  You will be able to see your baby's general anatomy an if you would like to know the gender this can usually be determined as well.  If it is questionable when you conceived you may also receive an ultrasound early in your pregnancy for dating purposes.  Otherwise ultrasound exams  are not routinely performed unless there is a medical necessity.  Although you can request a scan we ask that you pay for it when conducted because insurance does not cover " patient request" scans.  Work: If your pregnancy proceeds without complications you may work until your due date, unless your physician or employer advises otherwise.  Round Ligament Pain/Pelvic Discomfort:  Sharp, shooting pains not associated with bleeding are fairly common, usually occurring in the second trimester of pregnancy.  They tend to be worse when standing up or when you remain standing for long periods of time.  These are the result of pressure of certain pelvic ligaments called "round ligaments".  Rest, Tylenol and heat seem to be the most effective relief.  As the womb and fetus grow, they rise out of the pelvis and the discomfort improves.  Please notify the office if your pain seems different than that described.  It may represent a more serious condition.   

## 2017-08-05 NOTE — Progress Notes (Signed)
[name] presents for [facility] nurse interview visit. Pregnancy confirmation done at Urgent Care but pt did not bring confirmation. UPT: Positive. This was done at [facility]. LMP: 06/06/2017, unsure. Pt had miscarriage/D&C on 01/07/2017. Pt has a Hanna twins and so does the fob. G-2.  [id number]. Pregnancy education material explained and given. No cats in the home. NOB labs ordered.  A HbgA1c ordered due to [facility] of Diabetes.  HIV labs and Drug screen were explained optional and she did not decline. Drug screen ordered. Pt did want it noted that she did have poppy seeds yesterday. PNV encouraged. Genetic screening options discussed. Genetic testing: Ordered. Pt will do NT.  Pt may discuss with provider. Pt. To follow up with provider on 08/28/2017, with Dr. Marcelline Mates for NOB physical.  All questions answered.

## 2017-08-06 ENCOUNTER — Ambulatory Visit (INDEPENDENT_AMBULATORY_CARE_PROVIDER_SITE_OTHER): Payer: Medicaid Other

## 2017-08-06 DIAGNOSIS — Z3481 Encounter for supervision of other normal pregnancy, first trimester: Secondary | ICD-10-CM | POA: Diagnosis not present

## 2017-08-06 DIAGNOSIS — Z3687 Encounter for antenatal screening for uncertain dates: Secondary | ICD-10-CM

## 2017-08-06 DIAGNOSIS — Z8489 Family history of other specified conditions: Secondary | ICD-10-CM | POA: Diagnosis not present

## 2017-08-06 LAB — URINALYSIS, ROUTINE W REFLEX MICROSCOPIC
BILIRUBIN UA: NEGATIVE
GLUCOSE, UA: NEGATIVE
KETONES UA: NEGATIVE
Leukocytes, UA: NEGATIVE
Nitrite, UA: NEGATIVE
PH UA: 6 (ref 5.0–7.5)
PROTEIN UA: NEGATIVE
RBC UA: NEGATIVE
SPEC GRAV UA: 1.023 (ref 1.005–1.030)
UUROB: 0.2 mg/dL (ref 0.2–1.0)

## 2017-08-07 ENCOUNTER — Telehealth: Payer: Self-pay | Admitting: Obstetrics and Gynecology

## 2017-08-07 LAB — MONITOR DRUG PROFILE 14(MW)
Amphetamine Scrn, Ur: NEGATIVE ng/mL
BARBITURATE SCREEN URINE: NEGATIVE ng/mL
BENZODIAZEPINE SCREEN, URINE: NEGATIVE ng/mL
BUPRENORPHINE, URINE: NEGATIVE ng/mL
CANNABINOIDS UR QL SCN: NEGATIVE ng/mL
Cocaine (Metab) Scrn, Ur: NEGATIVE ng/mL
Creatinine(Crt), U: 244 mg/dL (ref 20.0–300.0)
Fentanyl, Urine: NEGATIVE pg/mL
MEPERIDINE SCREEN, URINE: NEGATIVE ng/mL
Methadone Screen, Urine: NEGATIVE ng/mL
OPIATE SCREEN URINE: NEGATIVE ng/mL
OXYCODONE+OXYMORPHONE UR QL SCN: NEGATIVE ng/mL
PROPOXYPHENE SCREEN URINE: NEGATIVE ng/mL
Ph of Urine: 5.5 (ref 4.5–8.9)
Phencyclidine Qn, Ur: NEGATIVE ng/mL
SPECIFIC GRAVITY: 1.023
Tramadol Screen, Urine: NEGATIVE ng/mL

## 2017-08-07 LAB — GC/CHLAMYDIA PROBE AMP
Chlamydia trachomatis, NAA: NEGATIVE
NEISSERIA GONORRHOEAE BY PCR: NEGATIVE

## 2017-08-07 LAB — RPR: RPR: NONREACTIVE

## 2017-08-07 LAB — CBC WITH DIFFERENTIAL/PLATELET
BASOS ABS: 0 10*3/uL (ref 0.0–0.2)
Basos: 0 %
EOS (ABSOLUTE): 0.1 10*3/uL (ref 0.0–0.4)
EOS: 2 %
HEMATOCRIT: 38.9 % (ref 34.0–46.6)
Hemoglobin: 12.6 g/dL (ref 11.1–15.9)
IMMATURE GRANULOCYTES: 0 %
Immature Grans (Abs): 0 10*3/uL (ref 0.0–0.1)
LYMPHS ABS: 2.1 10*3/uL (ref 0.7–3.1)
Lymphs: 28 %
MCH: 29.2 pg (ref 26.6–33.0)
MCHC: 32.4 g/dL (ref 31.5–35.7)
MCV: 90 fL (ref 79–97)
MONOS ABS: 0.5 10*3/uL (ref 0.1–0.9)
Monocytes: 6 %
NEUTROS PCT: 64 %
Neutrophils Absolute: 4.8 10*3/uL (ref 1.4–7.0)
PLATELETS: 289 10*3/uL (ref 150–379)
RBC: 4.32 x10E6/uL (ref 3.77–5.28)
RDW: 13.2 % (ref 12.3–15.4)
WBC: 7.6 10*3/uL (ref 3.4–10.8)

## 2017-08-07 LAB — VARICELLA ZOSTER ANTIBODY, IGG

## 2017-08-07 LAB — HEMOGLOBIN A1C
Est. average glucose Bld gHb Est-mCnc: 100 mg/dL
HEMOGLOBIN A1C: 5.1 % (ref 4.8–5.6)

## 2017-08-07 LAB — NICOTINE SCREEN, URINE: COTININE UR QL SCN: NEGATIVE ng/mL

## 2017-08-07 LAB — CULTURE, OB URINE

## 2017-08-07 LAB — HIV ANTIBODY (ROUTINE TESTING W REFLEX): HIV Screen 4th Generation wRfx: NONREACTIVE

## 2017-08-07 LAB — ABO

## 2017-08-07 LAB — ANTIBODY SCREEN: ANTIBODY SCREEN: NEGATIVE

## 2017-08-07 LAB — HEPATITIS B SURFACE ANTIGEN: Hepatitis B Surface Ag: NEGATIVE

## 2017-08-07 LAB — URINE CULTURE, OB REFLEX

## 2017-08-07 LAB — RUBELLA SCREEN: RUBELLA: 4.52 {index} (ref >0.99)

## 2017-08-07 LAB — RH TYPE: Rh Factor: NEGATIVE

## 2017-08-07 NOTE — Telephone Encounter (Signed)
Patient returned your call.

## 2017-08-07 NOTE — Telephone Encounter (Signed)
Patient called and stated that she would like to have a call back from [place], She missed a call and was told to call back. No other information was disclosed. Please advise.

## 2017-08-07 NOTE — Telephone Encounter (Signed)
Pt states she has sample of bonjesta but has not taken them. Insurance will not cover bonjesta or diclegis. Gave pt n/v protocol. Encouraged her to get b6 50 mg tid and 1/2 tab unisom at bedtime.

## 2017-08-28 ENCOUNTER — Encounter: Payer: Self-pay | Admitting: Obstetrics and Gynecology

## 2017-08-28 ENCOUNTER — Ambulatory Visit (INDEPENDENT_AMBULATORY_CARE_PROVIDER_SITE_OTHER): Payer: Medicaid Other | Admitting: Obstetrics and Gynecology

## 2017-08-28 VITALS — BP 123/79 | HR 91 | Wt 157.1 lb

## 2017-08-28 DIAGNOSIS — Z6791 Unspecified blood type, Rh negative: Secondary | ICD-10-CM | POA: Insufficient documentation

## 2017-08-28 DIAGNOSIS — Z124 Encounter for screening for malignant neoplasm of cervix: Secondary | ICD-10-CM

## 2017-08-28 DIAGNOSIS — O09291 Supervision of pregnancy with other poor reproductive or obstetric history, first trimester: Secondary | ICD-10-CM

## 2017-08-28 DIAGNOSIS — Z3481 Encounter for supervision of other normal pregnancy, first trimester: Secondary | ICD-10-CM

## 2017-08-28 DIAGNOSIS — O26899 Other specified pregnancy related conditions, unspecified trimester: Secondary | ICD-10-CM | POA: Insufficient documentation

## 2017-08-28 DIAGNOSIS — R110 Nausea: Secondary | ICD-10-CM

## 2017-08-28 DIAGNOSIS — O09899 Supervision of other high risk pregnancies, unspecified trimester: Secondary | ICD-10-CM

## 2017-08-28 HISTORY — DX: Supervision of pregnancy with other poor reproductive or obstetric history, first trimester: O09.291

## 2017-08-28 LAB — POCT URINALYSIS DIPSTICK
BILIRUBIN UA: NEGATIVE
GLUCOSE UA: NEGATIVE
Ketones, UA: NEGATIVE
NITRITE UA: NEGATIVE
Protein, UA: NEGATIVE
Spec Grav, UA: 1.02 (ref 1.010–1.025)
Urobilinogen, UA: 0.2 E.U./dL
pH, UA: 6 (ref 5.0–8.0)

## 2017-08-28 NOTE — Progress Notes (Signed)
ROB- Pt states the nausea has been getting better, still mainly in the morning, pt would like to  Discuss migraine safe medication and dosage

## 2017-08-28 NOTE — Progress Notes (Signed)
OBSTETRIC INITIAL PRENATAL VISIT  Subjective:    Holly Meyer is being seen today for her first obstetrical visit.  This is a planned pregnancy. She is a 21 y.o. G2P0010 female at 86w4dgestation, Estimated Date of Delivery: 03/13/18 with Patient's last menstrual period was 06/06/2017 (approximate), inconsistent consistent with 7 week sono. Her obstetrical history is significant for history of miscarriage x 1. Relationship with FOB: spouse, living together. Patient does intend to breast feed. Pregnancy history fully reviewed.    Obstetric History   G2   P0   T0   P0   A0   L0    SAB0   TAB0   Ectopic0   Multiple0   Live Births0     # Outcome Date GA Lbr Len/2nd Weight Sex Delivery Anes PTL Lv  2 Current           1 Gravida               Gynecologic History:  Last pap smear: unsure if she has had one?Marland Kitchen  Notes having a pelvic exam in the past but unsure if pap smear was ever done.  Denies history of STIs.    Past Medical History:  Diagnosis Date  . Complication of anesthesia    complaints of nausea and vomitting when wisdom teeth were removed  . FHx: diabetes mellitus   . Septate uterus   . Venous malformation    Right paraspinal     Family History  Problem Relation Age of Onset  . Hypertension Father   . Migraines Mother   . Diabetes Paternal Grandmother        family history  . Heart failure Paternal Grandfather   . Diabetes Paternal Grandfather   . Alzheimer's disease Maternal Grandmother      Past Surgical History:  Procedure Laterality Date  . WISDOM TOOTH EXTRACTION  2012     Social History   Socioeconomic History  . Marital status: Married    Spouse name: Not on file  . Number of children: Not on file  . Years of education: College  . Highest education level: Associate degree: academic program  Social Needs  . Financial resource strain: Not on file  . Food insecurity - worry: Not on file  . Food insecurity - inability: Not on file  .  Transportation needs - medical: Not on file  . Transportation needs - non-medical: Not on file  Occupational History  . Not on file  Tobacco Use  . Smoking status: Former Smoker    Packs/day: 0.25    Types: Cigarettes  . Smokeless tobacco: Never Used  . Tobacco comment: recently quit  Substance and Sexual Activity  . Alcohol use: Yes    Alcohol/week: 0.0 oz    Comment: socially  . Drug use: No  . Sexual activity: Yes    Partners: Male    Birth control/protection: None  Other Topics Concern  . 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   Works at [employer].    Enjoys swimming, watching movies.     Current Outpatient Medications on File Prior to Visit  Medication Sig Dispense Refill  . Prenatal Vit-Fe Fumarate-FA (MULTIVITAMIN-PRENATAL) 27-0.8 MG TABS tablet Take 1 tablet by mouth daily at 12 noon.    Marland Kitchen BONJESTA 20-20 MG TBCR Take 1 tablet by mouth 2 (two) times daily. (Patient not  taking: Reported on 08/28/2017) 180 tablet 1  . Doxylamine-Pyridoxine 10-10 MG TBEC Take 10 mg by mouth daily. 2 at bedtime, 1 in the am, 1 in the pm no more than 4 a day (Patient not taking: Reported on 08/28/2017) 60 tablet 1   No current facility-administered medications on file prior to visit.      Allergies  Allergen Reactions  . Fexofenadine Shortness Of Breath    Other reaction(s): Respiratory Distress (ALLERGY/intolerance)  . Imitrex [Sumatriptan] Other (See Comments)    Symptoms worsened  . Cetirizine Hcl   . Guaifenesin     Other reaction(s): Dizziness (intolerance)  . Guaifenesin Er Other (See Comments)  . Meloxicam Other (See Comments)  . Naproxen Other (See Comments)    Loss of appiteite  . Zyrtec  [Cetirizine]      Review of Systems General:Not Present- Fever, Weight Loss and Weight Gain. Skin:Not Present- Rash. HEENT:Not Present- Blurred Vision,  Headache and Bleeding Gums. Respiratory:Not Present- Difficulty Breathing. Breast:Not Present- Breast Mass. Cardiovascular:Not Present- Chest Pain, Elevated Blood Pressure, Fainting / Blacking Out and Shortness of Breath. Gastrointestinal:Present - Nausea (mild, managed with diet and occasional Bonjesta).Not Present- Abdominal Pain, Constipation, and Vomiting. Female Genitourinary:Not Present- Frequency, Painful Urination, Pelvic Pain, Vaginal Bleeding, Vaginal Discharge, Contractions, regular, Fetal Movements Decreased, Urinary Complaints and Vaginal Fluid. Musculoskeletal:Not Present- Back Pain and Leg Cramps. Neurological:Not Present- Dizziness. Psychiatric:Not Present- Depression.     Objective:   Blood pressure 123/79, pulse 91, weight 157 lb 1.6 oz (71.3 kg), last menstrual period 06/06/2017, unknown if currently breastfeeding.  Body mass index is 24.61 kg/m.  General Appearance:    Alert, cooperative, no distress, appears stated age  Head:    Normocephalic, without obvious abnormality, atraumatic  Eyes:    PERRL, conjunctiva/corneas clear, EOM's intact, both eyes  Ears:    Normal external ear canals, both ears  Nose:   Nares normal, septum midline, mucosa normal, no drainage or sinus tenderness  Throat:   Lips, mucosa, and tongue normal; teeth and gums normal  Neck:   Supple, symmetrical, trachea midline, no adenopathy; thyroid: no enlargement/tenderness/nodules; no carotid bruit or JVD  Back:     Symmetric, no curvature, ROM normal, no CVA tenderness  Lungs:     Clear to auscultation bilaterally, respirations unlabored  Chest Wall:    No tenderness or deformity   Heart:    Regular rate and rhythm, S1 and S2 normal, no murmur, rub or gallop  Breast Exam:    No tenderness, masses, or nipple abnormality  Abdomen:     Soft, non-tender, bowel sounds active all four quadrants, no masses, no organomegaly.  FH 11 cm.  FHT 166  bpm.  Genitalia:    Pelvic:external genitalia normal,  vagina without lesions, discharge, or tenderness, rectovaginal septum  normal. Cervix normal in appearance, no cervical motion tenderness, no adnexal masses or tenderness.  Pregnancy positive findings: uterine enlargement: 11 wk size, nontender.   Rectal:    Normal external sphincter.  No hemorrhoids appreciated. Internal exam not done.   Extremities:   Extremities normal, atraumatic, no cyanosis or edema  Pulses:   2+ and symmetric all extremities  Skin:   Skin color, texture, turgor normal, no rashes or lesions  Lymph nodes:   Cervical, supraclavicular, and axillary nodes normal  Neurologic:   CNII-XII intact, normal strength, sensation and reflexes throughout      Assessment:    Pregnancy at 10 and 4/7 weeks   H/o miscarriage x 1 Nausea of pregnancy Rh  negative status  Plan:    Initial labs reviewed. Rh negative, will need Rhogam at 28 weeks, or for any episodes of bleeding in pregnancy.  Pap smear performed today.  Prenatal vitamins encouraged. Problem list reviewed and updated. Nausea of pregnancy being managed with diet and Bonjesta.  New OB counseling:  The patient has been given an overview regarding routine prenatal care.  Recommendations regarding diet, weight gain, and exercise in pregnancy were given. Prenatal testing, optional genetic testing, and ultrasound use in pregnancy were reviewed.  AFP3 discussed: requested. Benefits of Breast Feeding were discussed. The patient is encouraged to consider nursing her baby post partum. Follow up in 4 weeks.  50% of 30 min visit spent on counseling and coordination of care.    Rubie Maid, MD [facility]

## 2017-08-30 ENCOUNTER — Other Ambulatory Visit: Payer: Medicaid Other

## 2017-08-31 LAB — PAP IG, CT-NG, RFX HPV ASCU
Chlamydia, Nuc. Acid Amp: NEGATIVE
Gonococcus by Nucleic Acid Amp: NEGATIVE
PAP SMEAR COMMENT: 0

## 2017-09-04 ENCOUNTER — Telehealth: Payer: Self-pay | Admitting: Obstetrics and Gynecology

## 2017-09-04 NOTE — Telephone Encounter (Signed)
The patient called and stated that she is concerned that she does not have an appointment for an U/S and would like to speak with a nurse if possible to confirm if one is needed, The patient informed me that she is approaching 12 wks in her pregnancy. No other information was disclosed. Please advise.

## 2017-09-04 NOTE — Telephone Encounter (Signed)
mychart message sent

## 2017-09-09 ENCOUNTER — Other Ambulatory Visit: Payer: Medicaid Other

## 2017-09-25 ENCOUNTER — Ambulatory Visit (INDEPENDENT_AMBULATORY_CARE_PROVIDER_SITE_OTHER): Payer: Medicaid Other | Admitting: Obstetrics and Gynecology

## 2017-09-25 ENCOUNTER — Encounter: Payer: Self-pay | Admitting: Obstetrics and Gynecology

## 2017-09-25 VITALS — BP 124/77 | HR 76 | Wt 153.2 lb

## 2017-09-25 DIAGNOSIS — R350 Frequency of micturition: Secondary | ICD-10-CM

## 2017-09-25 DIAGNOSIS — Z3482 Encounter for supervision of other normal pregnancy, second trimester: Secondary | ICD-10-CM

## 2017-09-25 LAB — POCT URINALYSIS DIPSTICK
Bilirubin, UA: NEGATIVE
GLUCOSE UA: NEGATIVE
Ketones, UA: NEGATIVE
LEUKOCYTES UA: NEGATIVE
NITRITE UA: NEGATIVE
PH UA: 6 (ref 5.0–8.0)
Protein, UA: NEGATIVE
SPEC GRAV UA: 1.025 (ref 1.010–1.025)
UROBILINOGEN UA: 0.2 U/dL

## 2017-09-25 NOTE — Addendum Note (Signed)
Addended by: Raliegh Ip on: 09/25/2017 09:30 AM   Modules accepted: Orders

## 2017-09-25 NOTE — Addendum Note (Signed)
Addended by: Raliegh Ip on: 09/25/2017 09:24 AM   Modules accepted: Orders

## 2017-09-25 NOTE — Progress Notes (Signed)
ROB: MaterniT 21 today.  AFP next visit.  Urine for culture.  Patient has changed her diet and is eating well -follow weight loss.

## 2017-09-27 LAB — URINE CULTURE

## 2017-09-29 LAB — MATERNIT 21 PLUS CORE, BLOOD
Chromosome 13: NEGATIVE
Chromosome 18: NEGATIVE
Chromosome 21: NEGATIVE
Y CHROMOSOME: NOT DETECTED

## 2017-10-01 ENCOUNTER — Other Ambulatory Visit: Payer: Self-pay | Admitting: Obstetrics and Gynecology

## 2017-10-01 ENCOUNTER — Encounter: Payer: Self-pay | Admitting: Obstetrics and Gynecology

## 2017-10-01 DIAGNOSIS — Z369 Encounter for antenatal screening, unspecified: Secondary | ICD-10-CM

## 2017-10-18 ENCOUNTER — Encounter: Payer: Self-pay | Admitting: Obstetrics and Gynecology

## 2017-10-22 ENCOUNTER — Encounter: Payer: Self-pay | Admitting: Obstetrics and Gynecology

## 2017-10-23 ENCOUNTER — Ambulatory Visit (INDEPENDENT_AMBULATORY_CARE_PROVIDER_SITE_OTHER): Payer: Medicaid Other | Admitting: Obstetrics and Gynecology

## 2017-10-23 ENCOUNTER — Encounter: Payer: Self-pay | Admitting: Obstetrics and Gynecology

## 2017-10-23 VITALS — BP 137/76 | HR 90 | Wt 153.2 lb

## 2017-10-23 DIAGNOSIS — Z3482 Encounter for supervision of other normal pregnancy, second trimester: Secondary | ICD-10-CM

## 2017-10-23 DIAGNOSIS — Z1379 Encounter for other screening for genetic and chromosomal anomalies: Secondary | ICD-10-CM

## 2017-10-23 LAB — POCT URINALYSIS DIPSTICK
Bilirubin, UA: NEGATIVE
Blood, UA: NEGATIVE
Glucose, UA: NEGATIVE
Ketones, UA: NEGATIVE
Leukocytes, UA: NEGATIVE
NITRITE UA: NEGATIVE
PH UA: 5 (ref 5.0–8.0)
PROTEIN UA: NEGATIVE
Spec Grav, UA: 1.01 (ref 1.010–1.025)
UROBILINOGEN UA: 0.2 U/dL

## 2017-10-23 NOTE — Progress Notes (Signed)
ROB: Doing well, no complaints. Answered questions regarding family member vaccinations recommended for pregnancy. Offered flu vaccine today, patient desires to wait until next visit.  Normal MaterniT21, for AFP today. RTC in 4 weeks, anatomy scan in 2 weeks.

## 2017-10-23 NOTE — Progress Notes (Signed)
Rob- afp today.

## 2017-10-27 LAB — AFP, SERUM, OPEN SPINA BIFIDA
AFP MOM: 0.79
AFP Value: 33 ng/mL
Gest. Age on Collection Date: 18 weeks
Maternal Age At EDD: 23 yr
OSBR Risk 1 IN: 10000
TEST RESULTS AFP: NEGATIVE
Weight: 153 [lb_av]

## 2017-11-06 ENCOUNTER — Ambulatory Visit (INDEPENDENT_AMBULATORY_CARE_PROVIDER_SITE_OTHER): Payer: Medicaid Other

## 2017-11-06 DIAGNOSIS — Z369 Encounter for antenatal screening, unspecified: Secondary | ICD-10-CM | POA: Diagnosis not present

## 2017-11-06 DIAGNOSIS — Z3482 Encounter for supervision of other normal pregnancy, second trimester: Secondary | ICD-10-CM | POA: Diagnosis not present

## 2017-11-20 ENCOUNTER — Ambulatory Visit (INDEPENDENT_AMBULATORY_CARE_PROVIDER_SITE_OTHER): Payer: Medicaid Other | Admitting: Obstetrics and Gynecology

## 2017-11-20 VITALS — BP 135/81 | HR 98 | Wt 154.8 lb

## 2017-11-20 DIAGNOSIS — Z3492 Encounter for supervision of normal pregnancy, unspecified, second trimester: Secondary | ICD-10-CM

## 2017-11-20 LAB — POCT URINALYSIS DIPSTICK
Bilirubin, UA: NEGATIVE
Glucose, UA: NEGATIVE
Ketones, UA: NEGATIVE
Leukocytes, UA: NEGATIVE
Nitrite, UA: NEGATIVE
PH UA: 6 (ref 5.0–8.0)
Protein, UA: NEGATIVE
RBC UA: NEGATIVE
Spec Grav, UA: 1.015 (ref 1.010–1.025)
UROBILINOGEN UA: 0.2 U/dL

## 2017-11-20 NOTE — Progress Notes (Signed)
ROB- pt is doing well 

## 2017-11-20 NOTE — Progress Notes (Signed)
ROB:   patient without complaints.  Doing well.  Feeling better with much less nausea.

## 2017-11-25 ENCOUNTER — Encounter: Payer: Self-pay | Admitting: Obstetrics and Gynecology

## 2017-12-18 ENCOUNTER — Encounter: Payer: Self-pay | Admitting: Obstetrics and Gynecology

## 2017-12-18 ENCOUNTER — Ambulatory Visit (INDEPENDENT_AMBULATORY_CARE_PROVIDER_SITE_OTHER): Payer: Medicaid Other | Admitting: Obstetrics and Gynecology

## 2017-12-18 VITALS — BP 116/76 | HR 96 | Wt 158.8 lb

## 2017-12-18 DIAGNOSIS — Z3482 Encounter for supervision of other normal pregnancy, second trimester: Secondary | ICD-10-CM

## 2017-12-18 DIAGNOSIS — Z130 Encounter for screening for diseases of the blood and blood-forming organs and certain disorders involving the immune mechanism: Secondary | ICD-10-CM

## 2017-12-18 DIAGNOSIS — Z113 Encounter for screening for infections with a predominantly sexual mode of transmission: Secondary | ICD-10-CM

## 2017-12-18 DIAGNOSIS — O2340 Unspecified infection of urinary tract in pregnancy, unspecified trimester: Secondary | ICD-10-CM

## 2017-12-18 DIAGNOSIS — Z131 Encounter for screening for diabetes mellitus: Secondary | ICD-10-CM

## 2017-12-18 LAB — POCT URINALYSIS DIPSTICK
Bilirubin, UA: NEGATIVE
Blood, UA: NEGATIVE
GLUCOSE UA: NEGATIVE
Ketones, UA: NEGATIVE
LEUKOCYTES UA: NEGATIVE
NITRITE UA: POSITIVE
PROTEIN UA: NEGATIVE
Spec Grav, UA: 1.02 (ref 1.010–1.025)
Urobilinogen, UA: 0.2 E.U./dL
pH, UA: 6 (ref 5.0–8.0)

## 2017-12-18 MED ORDER — NITROFURANTOIN MONOHYD MACRO 100 MG PO CAPS: 100.0000 mg | ORAL_CAPSULE | Freq: Two times a day (BID) | ORAL | 1 refills | Status: DC

## 2017-12-18 NOTE — Progress Notes (Addendum)
ROB: Doing well, no complaints. Nitrate positive UA today. Will treat with Macrobid, order culture. RTC in 2 week, for 28 week labs and Rhogam next visit.

## 2017-12-18 NOTE — Addendum Note (Signed)
Addended by: Augusto Gamble on: 12/18/2017 10:08 AM   Modules accepted: Orders

## 2017-12-18 NOTE — Progress Notes (Signed)
ROB pt is doing well no concerns.

## 2017-12-20 LAB — URINE CULTURE, OB REFLEX

## 2017-12-20 LAB — CULTURE, OB URINE

## 2017-12-26 ENCOUNTER — Encounter: Payer: Self-pay | Admitting: Obstetrics and Gynecology

## 2018-01-03 ENCOUNTER — Encounter: Payer: Self-pay | Admitting: Obstetrics and Gynecology

## 2018-01-03 ENCOUNTER — Other Ambulatory Visit: Payer: Medicaid Other

## 2018-01-03 ENCOUNTER — Ambulatory Visit (INDEPENDENT_AMBULATORY_CARE_PROVIDER_SITE_OTHER): Payer: Medicaid Other | Admitting: Obstetrics and Gynecology

## 2018-01-03 VITALS — BP 123/76 | HR 90 | Wt 160.2 lb

## 2018-01-03 DIAGNOSIS — Z23 Encounter for immunization: Secondary | ICD-10-CM | POA: Diagnosis not present

## 2018-01-03 DIAGNOSIS — Z3483 Encounter for supervision of other normal pregnancy, third trimester: Secondary | ICD-10-CM

## 2018-01-03 DIAGNOSIS — Z130 Encounter for screening for diseases of the blood and blood-forming organs and certain disorders involving the immune mechanism: Secondary | ICD-10-CM

## 2018-01-03 DIAGNOSIS — Z113 Encounter for screening for infections with a predominantly sexual mode of transmission: Secondary | ICD-10-CM

## 2018-01-03 DIAGNOSIS — Z131 Encounter for screening for diabetes mellitus: Secondary | ICD-10-CM

## 2018-01-03 LAB — POCT URINALYSIS DIPSTICK
Bilirubin, UA: NEGATIVE
Blood, UA: NEGATIVE
Glucose, UA: NEGATIVE
KETONES UA: NEGATIVE
NITRITE UA: NEGATIVE
PROTEIN UA: NEGATIVE
Spec Grav, UA: 1.01 (ref 1.010–1.025)
Urobilinogen, UA: 0.2 E.U./dL
pH, UA: 6.5 (ref 5.0–8.0)

## 2018-01-03 MED ORDER — RHO D IMMUNE GLOBULIN 1500 UNIT/2ML IJ SOSY: 300.0000 ug | PREFILLED_SYRINGE | Freq: Once | INTRAMUSCULAR | Status: DC

## 2018-01-03 MED ORDER — RHO D IMMUNE GLOBULIN 1500 UNITS IM SOSY
1500.0000 [IU] | PREFILLED_SYRINGE | Freq: Once | INTRAMUSCULAR | Status: AC
  Administered 2018-01-03: 1500 [IU] via INTRAMUSCULAR

## 2018-01-03 NOTE — Progress Notes (Signed)
ROB:  No problems.  1 hr GCT today.  Rhogam today.  TDAP today.  B-H ctxs.

## 2018-01-03 NOTE — Addendum Note (Signed)
Addended by: Elouise Munroe on: 01/03/2018 02:16 PM   Modules accepted: Orders

## 2018-01-03 NOTE — Progress Notes (Signed)
ROB-pt is doing well. Having some pain on the sides.

## 2018-01-04 LAB — CBC
HEMATOCRIT: 32.6 % — AB (ref 34.0–46.6)
HEMOGLOBIN: 10.4 g/dL — AB (ref 11.1–15.9)
MCH: 29.4 pg (ref 26.6–33.0)
MCHC: 31.9 g/dL (ref 31.5–35.7)
MCV: 92 fL (ref 79–97)
Platelets: 290 10*3/uL (ref 150–379)
RBC: 3.54 x10E6/uL — ABNORMAL LOW (ref 3.77–5.28)
RDW: 14.4 % (ref 12.3–15.4)
WBC: 9.4 10*3/uL (ref 3.4–10.8)

## 2018-01-04 LAB — RPR: RPR Ser Ql: NONREACTIVE

## 2018-01-04 LAB — GLUCOSE, 1 HOUR GESTATIONAL: Gestational Diabetes Screen: 89 mg/dL (ref 65–139)

## 2018-01-06 ENCOUNTER — Encounter: Payer: Self-pay | Admitting: Obstetrics and Gynecology

## 2018-01-10 ENCOUNTER — Encounter: Payer: Self-pay | Admitting: Obstetrics and Gynecology

## 2018-01-10 ENCOUNTER — Telehealth: Payer: Self-pay

## 2018-01-10 NOTE — Telephone Encounter (Signed)
Per Dr Marcelline Mates- pt is to put a pad on and see if it happens again. If it does she is to go to L&D. If the color lightens up she may hold off. Pt expressed understanding. Also encouraged to call after hours number or L&D if she has any further questions or concerns.

## 2018-01-16 ENCOUNTER — Ambulatory Visit (INDEPENDENT_AMBULATORY_CARE_PROVIDER_SITE_OTHER): Payer: Medicaid Other | Admitting: Obstetrics and Gynecology

## 2018-01-16 VITALS — BP 133/80 | HR 118 | Wt 160.5 lb

## 2018-01-16 DIAGNOSIS — O2613 Low weight gain in pregnancy, third trimester: Secondary | ICD-10-CM

## 2018-01-16 DIAGNOSIS — Z3483 Encounter for supervision of other normal pregnancy, third trimester: Secondary | ICD-10-CM

## 2018-01-16 LAB — POCT URINALYSIS DIPSTICK
Bilirubin, UA: NEGATIVE
Blood, UA: NEGATIVE
Glucose, UA: NEGATIVE
Ketones, UA: NEGATIVE
NITRITE UA: NEGATIVE
SPEC GRAV UA: 1.015 (ref 1.010–1.025)
Urobilinogen, UA: 0.2 E.U./dL
pH, UA: 7 (ref 5.0–8.0)

## 2018-01-16 NOTE — Progress Notes (Signed)
ROB: Doing well, no complaints. Concerned with poor weight gain. FH appropriate, discussed if anything changes, would order ultrasound.  Desires to breastfeed,desires unsure method for contraception (info booklet given).  Discussed cord blood banking. RTC in 2 weeks.

## 2018-01-16 NOTE — Progress Notes (Signed)
ROB-pt is concerned about not gaining weight and the baby.

## 2018-01-24 ENCOUNTER — Telehealth: Payer: Self-pay | Admitting: Obstetrics and Gynecology

## 2018-01-24 NOTE — Telephone Encounter (Signed)
LMTRC

## 2018-01-24 NOTE — Telephone Encounter (Signed)
Patient called stating she is unsure if she is having braxton hicks contractions, she is complaining of back pain. She is 31 weeks and 6 days. She would like to speak with a nurse for reassurance. Thanks

## 2018-01-24 NOTE — Telephone Encounter (Signed)
Ob 31 6/7- Was awaken at 2 am with back pain. She repositioned and was able to sleep until 8 am. She was having menstrual type cramps that were intense. Now they are on/off and not has intense. She has been at work all day. She works in [employer], research (medical). She is up on her feet all day. She is using a heating pad, warm bath, and elevating her feet- with mild relief. NO vb or lof. Pos Fm. Pt has had 32 oz of water today. NO uti sx, n/v, or fever. Pt advised to take tylenol 1000g q 6 h. Push fluids. Continue with heating pad. If sx gets worse to contact office. Pt voices understanding.

## 2018-01-30 ENCOUNTER — Ambulatory Visit (INDEPENDENT_AMBULATORY_CARE_PROVIDER_SITE_OTHER): Payer: Medicaid Other | Admitting: Obstetrics and Gynecology

## 2018-01-30 ENCOUNTER — Encounter: Payer: Self-pay | Admitting: Obstetrics and Gynecology

## 2018-01-30 VITALS — BP 128/73 | HR 99 | Wt 163.7 lb

## 2018-01-30 DIAGNOSIS — Z3483 Encounter for supervision of other normal pregnancy, third trimester: Secondary | ICD-10-CM | POA: Diagnosis not present

## 2018-01-30 LAB — POCT URINALYSIS DIPSTICK
Bilirubin, UA: NEGATIVE
Blood, UA: NEGATIVE
GLUCOSE UA: NEGATIVE
KETONES UA: NEGATIVE
NITRITE UA: NEGATIVE
ODOR: NEGATIVE
PROTEIN UA: NEGATIVE
Spec Grav, UA: 1.015 (ref 1.010–1.025)
Urobilinogen, UA: 0.2 E.U./dL
pH, UA: 6.5 (ref 5.0–8.0)

## 2018-01-30 NOTE — Progress Notes (Signed)
ROB: No complaints.  Patient feels well with active fetal movement.

## 2018-01-30 NOTE — Progress Notes (Signed)
Rob- more frequent BH and back pain. Tylenol and heating pad helps.

## 2018-02-20 ENCOUNTER — Encounter: Payer: Medicaid Other | Admitting: Obstetrics and Gynecology

## 2018-02-24 ENCOUNTER — Ambulatory Visit (INDEPENDENT_AMBULATORY_CARE_PROVIDER_SITE_OTHER): Payer: Self-pay | Admitting: Obstetrics and Gynecology

## 2018-02-24 VITALS — BP 120/79 | HR 80 | Wt 168.2 lb

## 2018-02-24 DIAGNOSIS — R102 Pelvic and perineal pain: Secondary | ICD-10-CM

## 2018-02-24 DIAGNOSIS — O26899 Other specified pregnancy related conditions, unspecified trimester: Secondary | ICD-10-CM

## 2018-02-24 DIAGNOSIS — Z3483 Encounter for supervision of other normal pregnancy, third trimester: Secondary | ICD-10-CM

## 2018-02-24 LAB — POCT URINALYSIS DIPSTICK
Bilirubin, UA: NEGATIVE
Glucose, UA: NEGATIVE
KETONES UA: NEGATIVE
Leukocytes, UA: NEGATIVE
NITRITE UA: NEGATIVE
PH UA: 7 (ref 5.0–8.0)
Protein, UA: NEGATIVE
RBC UA: NEGATIVE
SPEC GRAV UA: 1.01 (ref 1.010–1.025)
Urobilinogen, UA: 0.2 E.U./dL

## 2018-02-24 NOTE — Patient Instructions (Signed)
Vaginal Delivery Vaginal delivery means that you will give birth by pushing your baby out of your birth canal (vagina). A team of health care providers will help you before, during, and after vaginal delivery. Birth experiences are unique for every woman and every pregnancy, and birth experiences vary depending on where you choose to give birth. What should I do to prepare for my baby's birth? Before your baby is born, it is important to talk with your health care provider about:  Your labor and delivery preferences. These may include: ? Medicines that you may be given. ? How you will manage your pain. This might include non-medical pain relief techniques or injectable pain relief such as epidural analgesia. ? How you and your baby will be monitored during labor and delivery. ? Who may be in the labor and delivery room with you. ? Your feelings about surgical delivery of your baby (cesarean delivery, or C-section) if this becomes necessary. ? Your feelings about receiving donated blood through an IV tube (blood transfusion) if this becomes necessary.  Whether you are able: ? To take pictures or videos of the birth. ? To eat during labor and delivery. ? To move around, walk, or change positions during labor and delivery.  What to expect after your baby is born, such as: ? Whether delayed umbilical cord clamping and cutting is offered. ? Who will care for your baby right after birth. ? Medicines or tests that may be recommended for your baby. ? Whether breastfeeding is supported in your hospital or birth center. ? How long you will be in the hospital or birth center.  How any medical conditions you have may affect your baby or your labor and delivery experience.  To prepare for your baby's birth, you should also:  Attend all of your health care visits before delivery (prenatal visits) as recommended by your health care provider. This is important.  Prepare your home for your baby's  arrival. Make sure that you have: ? Diapers. ? Baby clothing. ? Feeding equipment. ? Safe sleeping arrangements for you and your baby.  Install a car seat in your vehicle. Have your car seat checked by a certified car seat installer to make sure that it is installed safely.  Think about who will help you with your new baby at home for at least the first several weeks after delivery.  What can I expect when I arrive at the birth center or hospital? Once you are in labor and have been admitted into the hospital or birth center, your health care provider may:  Review your pregnancy history and any concerns you have.  Insert an IV tube into one of your veins. This is used to give you fluids and medicines.  Check your blood pressure, pulse, temperature, and heart rate (vital signs).  Check whether your bag of water (amniotic sac) has broken (ruptured).  Talk with you about your birth plan and discuss pain control options.  Monitoring Your health care provider may monitor your contractions (uterine monitoring) and your baby's heart rate (fetal monitoring). You may need to be monitored:  Often, but not continuously (intermittently).  All the time or for long periods at a time (continuously). Continuous monitoring may be needed if: ? You are taking certain medicines, such as medicine to relieve pain or make your contractions stronger. ? You have pregnancy or labor complications.  Monitoring may be done by:  Placing a special stethoscope or a handheld monitoring device on your abdomen to   check your baby's heartbeat, and feeling your abdomen for contractions. This method of monitoring does not continuously record your baby's heartbeat or your contractions.  Placing monitors on your abdomen (external monitors) to record your baby's heartbeat and the frequency and length of contractions. You may not have to wear external monitors all the time.  Placing monitors inside of your uterus  (internal monitors) to record your baby's heartbeat and the frequency, length, and strength of your contractions. ? Your health care provider may use internal monitors if he or she needs more information about the strength of your contractions or your baby's heart rate. ? Internal monitors are put in place by passing a thin, flexible wire through your vagina and into your uterus. Depending on the type of monitor, it may remain in your uterus or on your baby's head until birth. ? Your health care provider will discuss the benefits and risks of internal monitoring with you and will ask for your permission before inserting the monitors.  Telemetry. This is a type of continuous monitoring that can be done with external or internal monitors. Instead of having to stay in bed, you are able to move around during telemetry. Ask your health care provider if telemetry is an option for you.  Physical exam Your health care provider may perform a physical exam. This may include:  Checking whether your baby is positioned: ? With the head toward your vagina (head-down). This is most common. ? With the head toward the top of your uterus (head-up or breech). If your baby is in a breech position, your health care provider may try to turn your baby to a head-down position so you can deliver vaginally. If it does not seem that your baby can be born vaginally, your provider may recommend surgery to deliver your baby. In rare cases, you may be able to deliver vaginally if your baby is head-up (breech delivery). ? Lying sideways (transverse). Babies that are lying sideways cannot be delivered vaginally.  Checking your cervix to determine: ? Whether it is thinning out (effacing). ? Whether it is opening up (dilating). ? How low your baby has moved into your birth canal.  What are the three stages of labor and delivery?  Normal labor and delivery is divided into the following three stages: Stage 1  Stage 1 is the  longest stage of labor, and it can last for hours or days. Stage 1 includes: ? Early labor. This is when contractions may be irregular, or regular and mild. Generally, early labor contractions are more than 10 minutes apart. ? Active labor. This is when contractions get longer, more regular, more frequent, and more intense. ? The transition phase. This is when contractions happen very close together, are very intense, and may last longer than during any other part of labor.  Contractions generally feel mild, infrequent, and irregular at first. They get stronger, more frequent (about every 2-3 minutes), and more regular as you progress from early labor through active labor and transition.  Many women progress through stage 1 naturally, but you may need help to continue making progress. If this happens, your health care provider may talk with you about: ? Rupturing your amniotic sac if it has not ruptured yet. ? Giving you medicine to help make your contractions stronger and more frequent.  Stage 1 ends when your cervix is completely dilated to 4 inches (10 cm) and completely effaced. This happens at the end of the transition phase. Stage 2  Once   your cervix is completely effaced and dilated to 4 inches (10 cm), you may start to feel an urge to push. It is common for the body to naturally take a rest before feeling the urge to push, especially if you received an epidural or certain other pain medicines. This rest period may last for up to 1-2 hours, depending on your unique labor experience.  During stage 2, contractions are generally less painful, because pushing helps relieve contraction pain. Instead of contraction pain, you may feel stretching and burning pain, especially when the widest part of your baby's head passes through the vaginal opening (crowning).  Your health care provider will closely monitor your pushing progress and your baby's progress through the vagina during stage 2.  Your  health care provider may massage the area of skin between your vaginal opening and anus (perineum) or apply warm compresses to your perineum. This helps it stretch as the baby's head starts to crown, which can help prevent perineal tearing. ? In some cases, an incision may be made in your perineum (episiotomy) to allow the baby to pass through the vaginal opening. An episiotomy helps to make the opening of the vagina larger to allow more room for the baby to fit through.  It is very important to breathe and focus so your health care provider can control the delivery of your baby's head. Your health care provider may have you decrease the intensity of your pushing, to help prevent perineal tearing.  After delivery of your baby's head, the shoulders and the rest of the body generally deliver very quickly and without difficulty.  Once your baby is delivered, the umbilical cord may be cut right away, or this may be delayed for 1-2 minutes, depending on your baby's health. This may vary among health care providers, hospitals, and birth centers.  If you and your baby are healthy enough, your baby may be placed on your chest or abdomen to help maintain the baby's temperature and to help you bond with each other. Some mothers and babies start breastfeeding at this time. Your health care team will dry your baby and help keep your baby warm during this time.  Your baby may need immediate care if he or she: ? Showed signs of distress during labor. ? Has a medical condition. ? Was born too early (prematurely). ? Had a bowel movement before birth (meconium). ? Shows signs of difficulty transitioning from being inside the uterus to being outside of the uterus. If you are planning to breastfeed, your health care team will help you begin a feeding. Stage 3  The third stage of labor starts immediately after the birth of your baby and ends after you deliver the placenta. The placenta is an organ that develops  during pregnancy to provide oxygen and nutrients to your baby in the womb.  Delivering the placenta may require some pushing, and you may have mild contractions. Breastfeeding can stimulate contractions to help you deliver the placenta.  After the placenta is delivered, your uterus should tighten (contract) and become firm. This helps to stop bleeding in your uterus. To help your uterus contract and to control bleeding, your health care provider may: ? Give you medicine by injection, through an IV tube, by mouth, or through your rectum (rectally). ? Massage your abdomen or perform a vaginal exam to remove any blood clots that are left in your uterus. ? Empty your bladder by placing a thin, flexible tube (catheter) into your bladder. ? Encourage   you to breastfeed your baby. After labor is over, you and your baby will be monitored closely to ensure that you are both healthy until you are ready to go home. Your health care team will teach you how to care for yourself and your baby. This information is not intended to replace advice given to you by your health care provider. Make sure you discuss any questions you have with your health care provider. Document Released: 07/10/2008 Document Revised: 04/20/2016 Document Reviewed: 10/16/2015 Elsevier Interactive Patient Education  2018 [employer]  

## 2018-02-24 NOTE — Progress Notes (Signed)
ROB-pt stated that she has experience some lower abd/pelvic pain that feels like period cramps x 1 week. Pt has noticed maybe contractions that last sometimes at long periods and other short periods of time.

## 2018-02-24 NOTE — Progress Notes (Signed)
ROB: Notes crampy pelvic pain and back pain x 1 week. Denies vaginal bleeding.  Discussed comfort measures. UA normal, no evidence of UTI. Labor precautions discussed. 36 week labs done today. RTC in 1week.

## 2018-02-26 LAB — GC/CHLAMYDIA PROBE AMP
Chlamydia trachomatis, NAA: NEGATIVE
NEISSERIA GONORRHOEAE BY PCR: NEGATIVE

## 2018-02-26 LAB — STREP GP B NAA: STREP GROUP B AG: NEGATIVE

## 2018-03-03 ENCOUNTER — Ambulatory Visit (INDEPENDENT_AMBULATORY_CARE_PROVIDER_SITE_OTHER): Payer: Medicaid Other | Admitting: Obstetrics and Gynecology

## 2018-03-03 ENCOUNTER — Encounter: Payer: Self-pay | Admitting: Obstetrics and Gynecology

## 2018-03-03 VITALS — BP 132/87 | HR 90 | Wt 170.5 lb

## 2018-03-03 DIAGNOSIS — Z3483 Encounter for supervision of other normal pregnancy, third trimester: Secondary | ICD-10-CM

## 2018-03-03 LAB — POCT URINALYSIS DIPSTICK
BILIRUBIN UA: NEGATIVE
GLUCOSE UA: NEGATIVE
Ketones, UA: NEGATIVE
Nitrite, UA: NEGATIVE
Odor: NEGATIVE
Protein, UA: NEGATIVE
RBC UA: NEGATIVE
Spec Grav, UA: 1.01 (ref 1.010–1.025)
Urobilinogen, UA: 0.2 E.U./dL
pH, UA: 7 (ref 5.0–8.0)

## 2018-03-03 NOTE — Progress Notes (Signed)
ROB: Patient with occasional irregular contractions.  No complaints.  Signs and symptoms of labor reviewed.

## 2018-03-03 NOTE — Progress Notes (Signed)
Rob- no complaints.

## 2018-03-11 ENCOUNTER — Ambulatory Visit (INDEPENDENT_AMBULATORY_CARE_PROVIDER_SITE_OTHER): Payer: Medicaid Other | Admitting: Obstetrics and Gynecology

## 2018-03-11 VITALS — BP 119/80 | HR 94 | Wt 172.3 lb

## 2018-03-11 DIAGNOSIS — Z3483 Encounter for supervision of other normal pregnancy, third trimester: Secondary | ICD-10-CM

## 2018-03-11 DIAGNOSIS — R102 Pelvic and perineal pain: Secondary | ICD-10-CM

## 2018-03-11 DIAGNOSIS — O26899 Other specified pregnancy related conditions, unspecified trimester: Secondary | ICD-10-CM

## 2018-03-11 LAB — POCT URINALYSIS DIPSTICK
Bilirubin, UA: NEGATIVE
Glucose, UA: NEGATIVE
Ketones, UA: NEGATIVE
NITRITE UA: NEGATIVE
PH UA: 7 (ref 5.0–8.0)
PROTEIN UA: NEGATIVE
RBC UA: NEGATIVE
SPEC GRAV UA: 1.015 (ref 1.010–1.025)
UROBILINOGEN UA: 0.2 U/dL

## 2018-03-11 NOTE — Progress Notes (Signed)
ROB-pt stated that she is having some cramping in the lower pelvic area like she is having a cycle.

## 2018-03-11 NOTE — Progress Notes (Signed)
ROB: Patient with pelvic cramping, no bleeding.  Discussed hydration, Tylenol, warm baths prn.  RTC in 1 week. Discussed labor precautions, and IOL at 41 weeks if post-dates ensues.

## 2018-03-18 ENCOUNTER — Encounter: Payer: Self-pay | Admitting: Obstetrics and Gynecology

## 2018-03-18 ENCOUNTER — Ambulatory Visit (INDEPENDENT_AMBULATORY_CARE_PROVIDER_SITE_OTHER): Payer: Self-pay | Admitting: Obstetrics and Gynecology

## 2018-03-18 VITALS — BP 120/88 | HR 92 | Wt 171.2 lb

## 2018-03-18 DIAGNOSIS — Z3483 Encounter for supervision of other normal pregnancy, third trimester: Secondary | ICD-10-CM

## 2018-03-18 LAB — POCT URINALYSIS DIPSTICK
BILIRUBIN UA: NEGATIVE
Glucose, UA: NEGATIVE
KETONES UA: NEGATIVE
NITRITE UA: NEGATIVE
Odor: NEGATIVE
PH UA: 7 (ref 5.0–8.0)
PROTEIN UA: POSITIVE — AB
Spec Grav, UA: 1.015 (ref 1.010–1.025)
UROBILINOGEN UA: 0.2 U/dL

## 2018-03-18 NOTE — Progress Notes (Signed)
ROB-pt stated that she was having some back pain and still having braxton hick contraction.

## 2018-03-18 NOTE — Progress Notes (Signed)
ROB: Patient without complaint.  Signs and symptoms of labor reviewed.  Discussed antepartum testing if patient goes postdates.  Discussed induction if patient goes postdates.  Induction scheduled for 6/13 12noon.

## 2018-03-19 ENCOUNTER — Other Ambulatory Visit: Payer: Self-pay | Admitting: Obstetrics and Gynecology

## 2018-03-19 DIAGNOSIS — O480 Post-term pregnancy: Secondary | ICD-10-CM

## 2018-03-24 ENCOUNTER — Encounter: Payer: Self-pay | Admitting: Obstetrics and Gynecology

## 2018-03-24 ENCOUNTER — Ambulatory Visit (INDEPENDENT_AMBULATORY_CARE_PROVIDER_SITE_OTHER): Payer: Medicaid Other | Admitting: Obstetrics and Gynecology

## 2018-03-24 ENCOUNTER — Ambulatory Visit (INDEPENDENT_AMBULATORY_CARE_PROVIDER_SITE_OTHER): Payer: Self-pay

## 2018-03-24 VITALS — BP 144/84 | HR 112 | Wt 174.5 lb

## 2018-03-24 DIAGNOSIS — Z3483 Encounter for supervision of other normal pregnancy, third trimester: Secondary | ICD-10-CM

## 2018-03-24 DIAGNOSIS — O480 Post-term pregnancy: Secondary | ICD-10-CM

## 2018-03-24 LAB — POCT URINALYSIS DIPSTICK
BILIRUBIN UA: NEGATIVE
GLUCOSE UA: NEGATIVE
KETONES UA: NEGATIVE
Nitrite, UA: NEGATIVE
Odor: NEGATIVE
PH UA: 7.5 (ref 5.0–8.0)
Protein, UA: POSITIVE — AB
SPEC GRAV UA: 1.01 (ref 1.010–1.025)
UROBILINOGEN UA: 0.2 U/dL

## 2018-03-24 NOTE — Progress Notes (Signed)
ROB and BPP- today.

## 2018-03-24 NOTE — H&P (Signed)
History and Physical   HPI  Holly Meyer is a 22 y.o. G2P0010 at [redacted]w[redacted]d Estimated Date of Delivery: 03/22/18 who is being admitted for scheduled cesarean delivery for frank breech presentation.  She was found today to have a breech presentation on her biophysical profile.  OB History  OB History  Gravida Para Term Preterm AB Living  2 0 0 0 1 0  SAB TAB Ectopic Multiple Live Births  1 0 0 0 0    # Outcome Date GA Lbr Len/2nd Weight Sex Delivery Anes PTL Lv  2 Current           1 SAB 11/2016 [redacted]w[redacted]d           PROBLEM LIST  Pregnancy complications or risks: Patient Active Problem List   Diagnosis Date Noted  . History of miscarriage, currently pregnant, first trimester 08/28/2017  . Rh negative state in antepartum period 08/28/2017  . Preventative health care 02/01/2017  . Allergic rhinitis 02/28/2016  . Headache, migraine 08/02/2015  . Back pain 04/28/2015  . Venous malformation 02/28/2012    Prenatal labs and studies: ABO, Rh: O/Negative/-- (10/22 6712) Antibody: Negative (10/22 0958) Rubella: 4.52 (10/22 0958) RPR: Non Reactive (03/22 0829)  HBsAg: Negative (10/22 0958)  HIV: Non Reactive (10/22 0958)  WPY:[id number] (05/13 1633)   Past Medical History:  Diagnosis Date  . Complication of anesthesia    complaints of nausea and vomitting when wisdom teeth were removed  . FHx: diabetes mellitus   . Septate uterus   . Venous malformation    Right paraspinal     Past Surgical History:  Procedure Laterality Date  . DILATION AND EVACUATION N/A 01/07/2017   Procedure: DILATATION AND EVACUATION;  Surgeon: Harlin Heys, MD;  Location: [facility] ORS;  Service: Gynecology;  Laterality: N/A;  . WISDOM TOOTH EXTRACTION  2012     Medications    Patient's Medications  New Prescriptions   No medications on file  Previous Medications   ACETAMINOPHEN (TYLENOL) 500 MG TABLET    Take by mouth.   CALCIUM CARBONATE (TUMS - DOSED IN MG ELEMENTAL CALCIUM) 500 MG CHEWABLE  TABLET    Chew 1 tablet by mouth daily.   CETIRIZINE (ZYRTEC) 10 MG TABLET    Take 10 mg by mouth daily.   PRENATAL VIT-FE FUMARATE-FA (MULTIVITAMIN-PRENATAL) 27-0.8 MG TABS TABLET    Take 1 tablet by mouth daily at 12 noon.  Modified Medications   No medications on file  Discontinued Medications   No medications on file     Allergies  Fexofenadine; Imitrex [sumatriptan]; Guaifenesin; Guaifenesin er; Meloxicam; and Naproxen  Review of Systems  Pertinent items are noted in HPI.  Physical Exam  BP (!) 144/84   Pulse (!) 112   Wt 174 lb 8 oz (79.2 kg)   LMP 06/06/2017 (Approximate) Comment: irregular bleeding  BMI 27.33 kg/m   Lungs:  CTA B Cardio: RRR without M/R/G Abd: Soft, gravid, NT Presentation: breech  EXT: No C/C/ 1+ Edema DTRs: 2+  CERVIX: not evaluated   See Prenatal records for more detailed PE.      Test Results  Results for orders placed or performed in visit on 03/24/18 (from the past 24 hour(s))  POCT urinalysis dipstick     Status: Abnormal   Collection Time: 03/24/18  9:12 AM  Result Value Ref Range   Color, UA yellow    Clarity, UA clear    Glucose, UA Negative Negative   Bilirubin, UA neg  Ketones, UA neg    Spec Grav, UA 1.010 1.010 - 1.025   Blood, UA trace non hem    pH, UA 7.5 5.0 - 8.0   Protein, UA Positive (A) Negative   Urobilinogen, UA 0.2 0.2 or 1.0 E.U./dL   Nitrite, UA neg    Leukocytes, UA Moderate (2+) (A) Negative   Appearance yellow    Odor neg     Assessment   G2P0010 at [redacted]w[redacted]d Estimated Date of Delivery: 03/22/18  Breech presentation   Patient Active Problem List   Diagnosis Date Noted  . History of miscarriage, currently pregnant, first trimester 08/28/2017  . Rh negative state in antepartum period 08/28/2017  . Preventative health care 02/01/2017  . Allergic rhinitis 02/28/2016  . Headache, migraine 08/02/2015  . Back pain 04/28/2015  . Venous malformation 02/28/2012    Plan  1. Admit to L&D :   plan  Cesarean delivery 2. EFM: -- Category 1 3. Spinal anesthesia for cesarean delivery 4. Admission labs    Finis Bud, M.D. 03/24/2018 10:04 AM

## 2018-03-24 NOTE — Addendum Note (Signed)
Addended by: Finis Bud on: 03/24/2018 10:09 AM   Modules accepted: Orders, SmartSet

## 2018-03-24 NOTE — Progress Notes (Signed)
ROB: Ultrasound today reveals frank breech presentation.  Patient scheduled for cesarean delivery tomorrow.  Discussed cesarean in detail with her.  If patient goes into labor today or tonight she has been instructed to go immediately to labor delivery.  All questions answered.

## 2018-03-24 NOTE — H&P (View-Only) (Signed)
History and Physical   HPI  Holly Meyer is a 22 y.o. G2P0010 at [redacted]w[redacted]d Estimated Date of Delivery: 03/22/18 who is being admitted for scheduled cesarean delivery for frank breech presentation.  She was found today to have a breech presentation on her biophysical profile.  OB History  OB History  Gravida Para Term Preterm AB Living  2 0 0 0 1 0  SAB TAB Ectopic Multiple Live Births  1 0 0 0 0    # Outcome Date GA Lbr Len/2nd Weight Sex Delivery Anes PTL Lv  2 Current           1 SAB 11/2016 [redacted]w[redacted]d           PROBLEM LIST  Pregnancy complications or risks: Patient Active Problem List   Diagnosis Date Noted  . History of miscarriage, currently pregnant, first trimester 08/28/2017  . Rh negative state in antepartum period 08/28/2017  . Preventative health care 02/01/2017  . Allergic rhinitis 02/28/2016  . Headache, migraine 08/02/2015  . Back pain 04/28/2015  . Venous malformation 02/28/2012    Prenatal labs and studies: ABO, Rh: O/Negative/-- (10/22 5188) Antibody: Negative (10/22 0958) Rubella: 4.52 (10/22 0958) RPR: Non Reactive (03/22 0829)  HBsAg: Negative (10/22 0958)  HIV: Non Reactive (10/22 0958)  CZY:[id number] (05/13 1633)   Past Medical History:  Diagnosis Date  . Complication of anesthesia    complaints of nausea and vomitting when wisdom teeth were removed  . FHx: diabetes mellitus   . Septate uterus   . Venous malformation    Right paraspinal     Past Surgical History:  Procedure Laterality Date  . DILATION AND EVACUATION N/A 01/07/2017   Procedure: DILATATION AND EVACUATION;  Surgeon: Harlin Heys, MD;  Location: [facility] ORS;  Service: Gynecology;  Laterality: N/A;  . WISDOM TOOTH EXTRACTION  2012     Medications    Patient's Medications  New Prescriptions   No medications on file  Previous Medications   ACETAMINOPHEN (TYLENOL) 500 MG TABLET    Take by mouth.   CALCIUM CARBONATE (TUMS - DOSED IN MG ELEMENTAL CALCIUM) 500 MG CHEWABLE  TABLET    Chew 1 tablet by mouth daily.   CETIRIZINE (ZYRTEC) 10 MG TABLET    Take 10 mg by mouth daily.   PRENATAL VIT-FE FUMARATE-FA (MULTIVITAMIN-PRENATAL) 27-0.8 MG TABS TABLET    Take 1 tablet by mouth daily at 12 noon.  Modified Medications   No medications on file  Discontinued Medications   No medications on file     Allergies  Fexofenadine; Imitrex [sumatriptan]; Guaifenesin; Guaifenesin er; Meloxicam; and Naproxen  Review of Systems  Pertinent items are noted in HPI.  Physical Exam  BP (!) 144/84   Pulse (!) 112   Wt 174 lb 8 oz (79.2 kg)   LMP 06/06/2017 (Approximate) Comment: irregular bleeding  BMI 27.33 kg/m   Lungs:  CTA B Cardio: RRR without M/R/G Abd: Soft, gravid, NT Presentation: breech  EXT: No C/C/ 1+ Edema DTRs: 2+  CERVIX: not evaluated   See Prenatal records for more detailed PE.      Test Results  Results for orders placed or performed in visit on 03/24/18 (from the past 24 hour(s))  POCT urinalysis dipstick     Status: Abnormal   Collection Time: 03/24/18  9:12 AM  Result Value Ref Range   Color, UA yellow    Clarity, UA clear    Glucose, UA Negative Negative   Bilirubin, UA neg  Ketones, UA neg    Spec Grav, UA 1.010 1.010 - 1.025   Blood, UA trace non hem    pH, UA 7.5 5.0 - 8.0   Protein, UA Positive (A) Negative   Urobilinogen, UA 0.2 0.2 or 1.0 E.U./dL   Nitrite, UA neg    Leukocytes, UA Moderate (2+) (A) Negative   Appearance yellow    Odor neg     Assessment   G2P0010 at [redacted]w[redacted]d Estimated Date of Delivery: 03/22/18  Breech presentation   Patient Active Problem List   Diagnosis Date Noted  . History of miscarriage, currently pregnant, first trimester 08/28/2017  . Rh negative state in antepartum period 08/28/2017  . Preventative health care 02/01/2017  . Allergic rhinitis 02/28/2016  . Headache, migraine 08/02/2015  . Back pain 04/28/2015  . Venous malformation 02/28/2012    Plan  1. Admit to L&D :   plan  Cesarean delivery 2. EFM: -- Category 1 3. Spinal anesthesia for cesarean delivery 4. Admission labs    Finis Bud, M.D. 03/24/2018 10:04 AM

## 2018-03-24 NOTE — Progress Notes (Signed)
Re check bp- 107/73

## 2018-03-25 ENCOUNTER — Other Ambulatory Visit: Payer: Self-pay

## 2018-03-25 ENCOUNTER — Inpatient Hospital Stay
Admission: EM | Admit: 2018-03-25 | Discharge: 2018-03-27 | DRG: 788 | Disposition: A | Discharge status: Home / Self Care | Payer: Medicaid Other | Attending: Obstetrics and Gynecology | Admitting: Obstetrics and Gynecology

## 2018-03-25 ENCOUNTER — Encounter
Admission: EM | Disposition: A | Discharge status: Home / Self Care | Payer: Self-pay | Source: Home / Self Care | Attending: Obstetrics and Gynecology

## 2018-03-25 ENCOUNTER — Inpatient Hospital Stay: Admission: RE | Admit: 2018-03-25 | Payer: Self-pay | Source: Ambulatory Visit | Admitting: Obstetrics and Gynecology

## 2018-03-25 ENCOUNTER — Inpatient Hospital Stay: Payer: Medicaid Other | Admitting: Anesthesiology

## 2018-03-25 DIAGNOSIS — O3403 Maternal care for unspecified congenital malformation of uterus, third trimester: Secondary | ICD-10-CM | POA: Diagnosis present

## 2018-03-25 DIAGNOSIS — Z6791 Unspecified blood type, Rh negative: Secondary | ICD-10-CM | POA: Diagnosis not present

## 2018-03-25 DIAGNOSIS — O480 Post-term pregnancy: Secondary | ICD-10-CM | POA: Diagnosis present

## 2018-03-25 DIAGNOSIS — Z3A40 40 weeks gestation of pregnancy: Secondary | ICD-10-CM | POA: Diagnosis not present

## 2018-03-25 DIAGNOSIS — Q512 Other doubling of uterus, unspecified: Secondary | ICD-10-CM

## 2018-03-25 DIAGNOSIS — Z87891 Personal history of nicotine dependence: Secondary | ICD-10-CM

## 2018-03-25 DIAGNOSIS — O26893 Other specified pregnancy related conditions, third trimester: Secondary | ICD-10-CM | POA: Diagnosis present

## 2018-03-25 DIAGNOSIS — O321XX0 Maternal care for breech presentation, not applicable or unspecified: Principal | ICD-10-CM | POA: Diagnosis present

## 2018-03-25 DIAGNOSIS — O328XX0 Maternal care for other malpresentation of fetus, not applicable or unspecified: Secondary | ICD-10-CM | POA: Diagnosis present

## 2018-03-25 LAB — CBC
HCT: 32.7 % — ABNORMAL LOW (ref 35.0–47.0)
HEMOGLOBIN: 11.1 g/dL — AB (ref 12.0–16.0)
MCH: 29.4 pg (ref 26.0–34.0)
MCHC: 33.9 g/dL (ref 32.0–36.0)
MCV: 86.6 fL (ref 80.0–100.0)
Platelets: 293 10*3/uL (ref 150–440)
RBC: 3.78 MIL/uL — AB (ref 3.80–5.20)
RDW: 14.8 % — ABNORMAL HIGH (ref 11.5–14.5)
WBC: 10 10*3/uL (ref 3.6–11.0)

## 2018-03-25 SURGERY — Surgical Case
Anesthesia: Spinal | Site: Abdomen | Wound class: Clean Contaminated

## 2018-03-25 MED ORDER — LIDOCAINE HCL (PF) 1 % IJ SOLN
INTRAMUSCULAR | Status: DC | PRN
  Administered 2018-03-25: 3 mL via SUBCUTANEOUS

## 2018-03-25 MED ORDER — HYDROMORPHONE HCL 1 MG/ML IJ SOLN: 0.2500 mg | INTRAMUSCULAR | Status: DC

## 2018-03-25 MED ORDER — HYDROMORPHONE HCL 1 MG/ML IJ SOLN
0.2500 mg | INTRAMUSCULAR | Status: DC | PRN
  Administered 2018-03-25: 0.25 mg via INTRAVENOUS

## 2018-03-25 MED ORDER — SENNOSIDES-DOCUSATE SODIUM 8.6-50 MG PO TABS
2.0000 | ORAL_TABLET | ORAL | Status: DC
  Administered 2018-03-26 – 2018-03-27 (×2): 2 via ORAL
  Filled 2018-03-25 (×2): qty 2

## 2018-03-25 MED ORDER — SODIUM CHLORIDE 0.9 % IJ SOLN
INTRAMUSCULAR | Status: AC
  Filled 2018-03-25: qty 10

## 2018-03-25 MED ORDER — OXYCODONE-ACETAMINOPHEN 5-325 MG PO TABS
1.0000 | ORAL_TABLET | ORAL | Status: DC | PRN
  Administered 2018-03-26 – 2018-03-27 (×2): 1 via ORAL
  Filled 2018-03-25: qty 1

## 2018-03-25 MED ORDER — SODIUM CHLORIDE 0.9 % IV SOLN
INTRAVENOUS | Status: DC | PRN
  Administered 2018-03-25: 25 ug/min via INTRAVENOUS

## 2018-03-25 MED ORDER — PHENYLEPHRINE HCL 10 MG/ML IJ SOLN
INTRAMUSCULAR | Status: DC | PRN
  Administered 2018-03-25: 100 ug via INTRAVENOUS

## 2018-03-25 MED ORDER — OXYTOCIN 40 UNITS IN LACTATED RINGERS INFUSION - SIMPLE MED
INTRAVENOUS | Status: AC
  Filled 2018-03-25: qty 1000

## 2018-03-25 MED ORDER — LIDOCAINE 5 % EX PTCH
MEDICATED_PATCH | CUTANEOUS | Status: DC | PRN
  Administered 2018-03-25: 1 via TRANSDERMAL

## 2018-03-25 MED ORDER — LACTATED RINGERS IV SOLN: INTRAVENOUS | Status: DC

## 2018-03-25 MED ORDER — BUPIVACAINE IN DEXTROSE 0.75-8.25 % IT SOLN
INTRATHECAL | Status: DC | PRN
  Administered 2018-03-25: 1.8 mL via INTRATHECAL

## 2018-03-25 MED ORDER — DIPHENHYDRAMINE HCL 25 MG PO CAPS: 25.0000 mg | ORAL_CAPSULE | Freq: Four times a day (QID) | ORAL | Status: DC | PRN

## 2018-03-25 MED ORDER — MENTHOL 3 MG MT LOZG
1.0000 | LOZENGE | OROMUCOSAL | Status: DC | PRN
  Filled 2018-03-25: qty 9

## 2018-03-25 MED ORDER — CEFAZOLIN SODIUM-DEXTROSE 2-4 GM/100ML-% IV SOLN
2.0000 g | Freq: Three times a day (TID) | INTRAVENOUS | Status: DC
  Administered 2018-03-25: 2 g via INTRAVENOUS
  Filled 2018-03-25 (×3): qty 100

## 2018-03-25 MED ORDER — MORPHINE SULFATE (PF) 0.5 MG/ML IJ SOLN
INTRAMUSCULAR | Status: AC
Start: 2018-03-25 — End: ?
  Filled 2018-03-25: qty 10

## 2018-03-25 MED ORDER — OXYTOCIN 40 UNITS IN LACTATED RINGERS INFUSION - SIMPLE MED
INTRAVENOUS | Status: DC | PRN
  Administered 2018-03-25: 1000 mL via INTRAVENOUS

## 2018-03-25 MED ORDER — MORPHINE SULFATE (PF) 2 MG/ML IV SOLN: 1.0000 mg | INTRAVENOUS | Status: AC | PRN

## 2018-03-25 MED ORDER — HYDROMORPHONE HCL 1 MG/ML IJ SOLN
INTRAMUSCULAR | Status: AC
  Filled 2018-03-25: qty 1

## 2018-03-25 MED ORDER — ONDANSETRON HCL 4 MG/2ML IJ SOLN
INTRAMUSCULAR | Status: DC | PRN
  Administered 2018-03-25: 4 mg via INTRAVENOUS

## 2018-03-25 MED ORDER — DEXAMETHASONE SODIUM PHOSPHATE 10 MG/ML IJ SOLN
INTRAMUSCULAR | Status: DC | PRN
  Administered 2018-03-25: 10 mg via INTRAVENOUS

## 2018-03-25 MED ORDER — ONDANSETRON HCL 4 MG/2ML IJ SOLN
INTRAMUSCULAR | Status: AC
  Filled 2018-03-25: qty 2

## 2018-03-25 MED ORDER — PHENYLEPHRINE HCL 10 MG/ML IJ SOLN
INTRAMUSCULAR | Status: AC
  Filled 2018-03-25: qty 1

## 2018-03-25 MED ORDER — OXYCODONE-ACETAMINOPHEN 5-325 MG PO TABS
2.0000 | ORAL_TABLET | ORAL | Status: DC | PRN
  Filled 2018-03-25: qty 2

## 2018-03-25 MED ORDER — SOD CITRATE-CITRIC ACID 500-334 MG/5ML PO SOLN
30.0000 mL | ORAL | Status: AC
  Administered 2018-03-25: 30 mL via ORAL
  Filled 2018-03-25: qty 15

## 2018-03-25 MED ORDER — MORPHINE SULFATE (PF) 0.5 MG/ML IJ SOLN
INTRAMUSCULAR | Status: DC | PRN
  Administered 2018-03-25: 1 mg via INTRAVENOUS
  Administered 2018-03-25: .1 mg via INTRATHECAL

## 2018-03-25 MED ORDER — ZOLPIDEM TARTRATE 5 MG PO TABS: 5.0000 mg | ORAL_TABLET | Freq: Every evening | ORAL | Status: DC | PRN

## 2018-03-25 MED ORDER — LACTATED RINGERS IV BOLUS
1000.0000 mL | Freq: Once | INTRAVENOUS | Status: AC
  Administered 2018-03-25: 1000 mL via INTRAVENOUS

## 2018-03-25 MED ORDER — PRENATAL MULTIVITAMIN CH
1.0000 | ORAL_TABLET | Freq: Every day | ORAL | Status: DC
  Administered 2018-03-26: 1 via ORAL
  Filled 2018-03-25: qty 1

## 2018-03-25 MED ORDER — DEXAMETHASONE SODIUM PHOSPHATE 10 MG/ML IJ SOLN
INTRAMUSCULAR | Status: AC
  Filled 2018-03-25: qty 1

## 2018-03-25 MED ORDER — PROMETHAZINE HCL 25 MG/ML IJ SOLN
25.0000 mg | Freq: Once | INTRAMUSCULAR | Status: AC
  Administered 2018-03-25: 25 mg via INTRAVENOUS
  Filled 2018-03-25: qty 1

## 2018-03-25 MED ORDER — IBUPROFEN 600 MG PO TABS
600.0000 mg | ORAL_TABLET | Freq: Four times a day (QID) | ORAL | Status: DC
  Administered 2018-03-25 – 2018-03-27 (×6): 600 mg via ORAL
  Filled 2018-03-25 (×6): qty 1

## 2018-03-25 MED ORDER — ONDANSETRON HCL 4 MG/2ML IJ SOLN
4.0000 mg | Freq: Once | INTRAMUSCULAR | Status: AC | PRN
  Administered 2018-03-25: 4 mg via INTRAVENOUS
  Filled 2018-03-25: qty 2

## 2018-03-25 MED ORDER — ACETAMINOPHEN 325 MG PO TABS
650.0000 mg | ORAL_TABLET | ORAL | Status: DC | PRN
Start: 2018-03-25 — End: 2018-03-27

## 2018-03-25 MED ORDER — SIMETHICONE 80 MG PO CHEW
80.0000 mg | CHEWABLE_TABLET | Freq: Four times a day (QID) | ORAL | Status: DC
  Administered 2018-03-25 – 2018-03-27 (×4): 80 mg via ORAL
  Filled 2018-03-25 (×4): qty 1

## 2018-03-25 MED ORDER — FENTANYL CITRATE (PF) 100 MCG/2ML IJ SOLN: 25.0000 ug | INTRAMUSCULAR | Status: DC | PRN

## 2018-03-25 MED ORDER — OXYTOCIN 40 UNITS IN LACTATED RINGERS INFUSION - SIMPLE MED
2.5000 [IU]/h | INTRAVENOUS | Status: AC
  Administered 2018-03-25: 2.5 [IU]/h via INTRAVENOUS

## 2018-03-25 MED ORDER — LIDOCAINE 5 % EX PTCH
1.0000 | MEDICATED_PATCH | CUTANEOUS | Status: DC
  Administered 2018-03-26: 1 via TRANSDERMAL
  Filled 2018-03-25 (×2): qty 1

## 2018-03-25 SURGICAL SUPPLY — 25 items
ADHESIVE MASTISOL STRL (MISCELLANEOUS) ×2 IMPLANT
BAG COUNTER SPONGE EZ (MISCELLANEOUS) ×2 IMPLANT
BENZOIN TINCTURE PRP APPL 2/3 (GAUZE/BANDAGES/DRESSINGS) ×2 IMPLANT
CANISTER SUCT 3000ML PPV (MISCELLANEOUS) ×2 IMPLANT
CELL SAVER LIPIGURD (MISCELLANEOUS) ×1 IMPLANT
CHLORAPREP W/TINT 26ML (MISCELLANEOUS) ×4 IMPLANT
DRSG TELFA 3X8 NADH (GAUZE/BANDAGES/DRESSINGS) ×2 IMPLANT
EXTRT SYSTEM ALEXIS 14CM (MISCELLANEOUS) ×2
GAUZE SPONGE 4X4 12PLY STRL (GAUZE/BANDAGES/DRESSINGS) ×2 IMPLANT
GLOVE BIOGEL PI ORTHO PRO 7.5 (GLOVE) ×1
GLOVE PI ORTHO PRO STRL 7.5 (GLOVE) ×1 IMPLANT
GOWN STRL REUS W/ TWL LRG LVL3 (GOWN DISPOSABLE) ×2 IMPLANT
GOWN STRL REUS W/TWL LRG LVL3 (GOWN DISPOSABLE) ×2
KIT TURNOVER KIT A (KITS) ×2 IMPLANT
NS IRRIG 1000ML POUR BTL (IV SOLUTION) ×2 IMPLANT
PACK C SECTION AR (MISCELLANEOUS) ×2 IMPLANT
PAD OB MATERNITY 4.3X12.25 (PERSONAL CARE ITEMS) ×2 IMPLANT
PAD PREP 24X41 OB/GYN DISP (PERSONAL CARE ITEMS) ×2 IMPLANT
SPONGE LAP 18X18 RF (DISPOSABLE) ×2 IMPLANT
STRIP CLOSURE SKIN 1/2X4 (GAUZE/BANDAGES/DRESSINGS) ×2 IMPLANT
SUT VIC AB 0 CTX 36 (SUTURE) ×2
SUT VIC AB 0 CTX36XBRD ANBCTRL (SUTURE) ×2 IMPLANT
SUT VIC AB 1 CT1 36 (SUTURE) ×4 IMPLANT
SUT VICRYL 3-0 36IN CTB-1 (SUTURE) ×2 IMPLANT
SUT VICRYL+ 3-0 36IN CT-1 (SUTURE) ×4 IMPLANT

## 2018-03-25 NOTE — Op Note (Signed)
      OP NOTE  Date: 03/25/2018   11:11 AM Name Holly Meyer MR# [id number]  Preoperative Diagnosis: 1. Intrauterine pregnancy at [redacted]w[redacted]d Active Problems:   Abnormal fetal lie, antepartum  2.  malpresentation: Pilar Plate Breech  Postoperative Diagnosis: 1. Intrauterine pregnancy at [redacted]w[redacted]d, delivered 2. Viable infant 3. Remainder same as pre-op   Procedure: 1. Primary Low-Transverse Cesarean Section  Surgeon: Finis Bud, MD  Assistant:    Anesthesia: Spinal    EBL: 0  ml     Findings: 1) female infant, Apgar scores of 8    at 1 minute and 8    at 5 minutes and a birthweight of 106.88  ounces.    2) Normal uterus, tubes and ovaries.    Procedure:  The patient was prepped and draped in the supine position and placed under spinal anesthesia.  A transverse incision was made across the abdomen in a Pfannenstiel manner. If indicated the old scar was systematically removed with sharp dissection.  We carried the dissection down to the level of the fascia.  The fascia was incised in a curvilinear manner.  The fascia was then elevated from the rectus muscles with blunt and sharp dissection.  The rectus muscles were separated laterally exposing the peritoneum.  The peritoneum was carefully entered with care being taken to avoid bowel and bladder.  A self-retaining retractor was placed.  The visceral peritoneum was incised in a curvilinear fashion across the lower uterine segment creating a bladder flap. A transverse incision was made across the lower uterine segment and extended laterally and superiorly using the bandage scissors.  Artificial rupture membranes was performed and Clear fluid was noted.  The infant was delivered from the breech position using the Mauriceau-Smellie-Veit maneuver.  A nuchal cord was not present. The cord was doubly clamped and cut. Cord blood was obtained if appropriate.  The infant was handed to the pediatric personnel  who then placed the infant under heat  lamps where it was cleaned dried and re-suctioned. The placenta was delivered. The hysterotomy incision was then identified on ring forceps.  The uterine cavity was cleaned with a moist lap sponge.  The hysterotomy incision was closed with a running interlocking suture of Vicryl.  Hemostasis was excellent.  Pitocin was run in the IV and the uterus was found to be firm. The posterior cul-de-sac and gutters were cleaned and inspected.  Hemostasis was noted.  The fascia was then closed with a running suture of #1 Vicryl.  Hemostasis of the subcutaneous tissues was obtained using the Bovie.  The subcutaneous tissues were closed with a running suture of 000 Vicryl.  A subcuticular suture was placed.  Steri-Strips were applied in the usual manner.  A pressure dressing was placed.  The patient went to the recovery room in stable condition.   Finis Bud, M.D. 03/25/2018 11:11 AM

## 2018-03-25 NOTE — Anesthesia Post-op Follow-up Note (Signed)
Anesthesia QCDR form completed.        

## 2018-03-25 NOTE — Interval H&P Note (Signed)
History and Physical Interval Note:  03/25/2018 9:40 AM  Holly Meyer  has presented today for surgery, with the diagnosis of POST [place]  The various methods of treatment have been discussed with the patient and family. After consideration of risks, benefits and other options for treatment, the patient has consented to  Procedure(s): CESAREAN SECTION (N/A) as a surgical intervention .  The patient's history has been reviewed, patient examined, no change in status, stable for surgery.  I have reviewed the patient's chart and labs.  Questions were answered to the patient's satisfaction.     Jeannie Fend

## 2018-03-25 NOTE — Anesthesia Procedure Notes (Signed)
Spinal  Start time: 03/25/2018 9:48 AM End time: 03/25/2018 9:56 AM Staffing Anesthesiologist: Alvin Critchley, MD Resident/CRNA: Nile Riggs, CRNA Performed: resident/CRNA  Preanesthetic Checklist Completed: patient identified, site marked, surgical consent, pre-op evaluation, timeout performed, IV checked, risks and benefits discussed and monitors and equipment checked Spinal Block Patient position: sitting Prep: ChloraPrep Patient monitoring: heart rate, continuous pulse ox and blood pressure Approach: midline Location: L3-4 Injection technique: single-shot Needle Needle type: Pencan  Needle gauge: 24 G Assessment Sensory level: T4 Additional Notes Timeout @0948 . Pt tolerated procedure well; VSS.

## 2018-03-25 NOTE — Anesthesia Preprocedure Evaluation (Addendum)
Anesthesia Evaluation  Patient identified by MRN, date of birth, ID band Patient awake    Reviewed: Allergy & Precautions, NPO status , Patient's Chart, lab work & pertinent test results  History of Anesthesia Complications (+) PONV and history of anesthetic complications  Airway Mallampati: II  TM Distance: >3 FB     Dental  (+) Teeth Intact   Pulmonary former smoker,    Pulmonary exam normal        Cardiovascular negative cardio ROS Normal cardiovascular exam     Neuro/Psych  Headaches, negative psych ROS   GI/Hepatic negative GI ROS, Neg liver ROS,   Endo/Other  negative endocrine ROS  Renal/GU negative Renal ROS  negative genitourinary   Musculoskeletal   Abdominal Normal abdominal exam  (+)   Peds negative pediatric ROS (+)  Hematology negative hematology ROS (+)   Anesthesia Other Findings 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  Had been seen in the pain clinic  Reproductive/Obstetrics (+) Pregnancy                            Anesthesia Physical Anesthesia Plan  ASA: II  Anesthesia Plan: Spinal   Post-op Pain Management:    Induction: Intravenous  PONV Risk Score and Plan:   Airway Management Planned: Nasal Cannula  Additional Equipment:   Intra-op Plan:   Post-operative Plan:   Informed Consent: I have reviewed the patients History and Physical, chart, labs and discussed the procedure including the risks, benefits and alternatives for the proposed anesthesia with the patient or authorized representative who has indicated his/her understanding and acceptance.   Dental advisory given  Plan Discussed with: CRNA and Surgeon  Anesthesia Plan Comments:         Anesthesia Quick Evaluation

## 2018-03-25 NOTE — Progress Notes (Signed)
Patient vomiting x6 since admission to unit. Zofran given in OR and again at 13:21. Zofran has not worked for patient. RN to call provider for update. Patient refusing PO pain meds at this time due to N/V but states she's not having much pain since getting dilaudid dose at 1300.

## 2018-03-25 NOTE — Transfer of Care (Signed)
Immediate Anesthesia Transfer of Care Note  Patient: Holly Meyer  Procedure(s) Performed: PRIMARY CESAREAN SECTION (N/A Abdomen)  Patient Location: PACU  Anesthesia Type:Spinal  Level of Consciousness: awake, alert , oriented and patient cooperative  Airway & Oxygen Therapy: Patient Spontanous Breathing  Post-op Assessment: Report given to RN, Post -op Vital signs reviewed and stable and Patient moving all extremities  Post vital signs: Reviewed and stable  Last Vitals:  Vitals Value Taken Time  BP 116/80 03/25/2018 11:16 AM  Temp 36 C 03/25/2018 11:16 AM  Pulse 88 03/25/2018 11:16 AM  Resp 22 03/25/2018 11:16 AM  SpO2 100 % 03/25/2018 11:16 AM    Last Pain:  Vitals:   03/25/18 1116  TempSrc: Oral  PainSc: 0-No pain         Complications: No apparent anesthesia complications

## 2018-03-25 NOTE — Consult Note (Signed)
Neonatology Note:   Attendance at C-section:    I was asked by Dr. Amalia Hailey to attend this primary C/S at term due to breech presentation. The mother is a G2P0A1 O neg, GBS neg with septate uterus. ROM at delivery, fluid clear. Infant delivered frank breech with ease, and was fairly vigorous with good spontaneous cry (although not lusty) and tone, but had a lot of oral secretions. Delayed cord clamping was done. Needed repeated bulb suctioning for oral secretions. Baby had brief dips in HR with any accumulation of secretions, so DeLee suctioning to the stomach was performed, getting out a lot of blood-tinged fluid. O2 saturation was 65% in room air at 5-6 minutes, so BBO2 was started. Ap 8/8. Lungs with rales in DR. We were unable to withdraw supplemental O2 after 10-15 minutes in the DR, so she was shown to her mother and we moved her to the SCN for close observation during transition. Her father accompanied the baby to the SCN and was updated moment to moment. We continued BBO2 and performed chest PT, after which she sounded much clearer. The supplemental O2 was weaned away gradually and she maintained normal O2 sats by about 30 minutes of life. No respiratory distress. Her temperature was low (36.1 degrees), so we are keeping her in SCN until her temperature had normalized before sending her back to her parents for skin to skin time. She will be transferred to the care of her pediatrician at that time.   Real Cons, MD

## 2018-03-26 LAB — BPAM RBC
BLOOD PRODUCT EXPIRATION DATE: 201907162359
Blood Product Expiration Date: 201907162359
UNIT TYPE AND RH: 9500
Unit Type and Rh: 9500

## 2018-03-26 LAB — TYPE AND SCREEN
ABO/RH(D): O NEG
Antibody Screen: POSITIVE
UNIT DIVISION: 0
Unit division: 0

## 2018-03-26 LAB — FETAL SCREEN: Fetal Screen: NEGATIVE

## 2018-03-26 LAB — RPR: RPR: NONREACTIVE

## 2018-03-26 MED ORDER — RHO D IMMUNE GLOBULIN 1500 UNIT/2ML IJ SOSY
300.0000 ug | PREFILLED_SYRINGE | Freq: Once | INTRAMUSCULAR | Status: AC
  Administered 2018-03-26: 300 ug via INTRAVENOUS
  Filled 2018-03-26: qty 2

## 2018-03-26 MED ORDER — COCONUT OIL OIL
1.0000 "application " | TOPICAL_OIL | Status: DC | PRN
  Administered 2018-03-26: 1 via TOPICAL

## 2018-03-26 NOTE — Progress Notes (Signed)
Patient ID: Holly Meyer, female   DOB: 1996/02/03, 22 y.o.   MRN: [id number]    Progress Note - Cesarean Delivery  Holly Meyer is a 22 y.o. [id number] now PP day 1 s/p C-Section, Low Transverse .   Subjective:  Patient reports no problems with eating, bowel movements, voiding, or their wound.  N/V resolved with phenergan last night.  Objective:  Vital signs in last 24 hours: Temp:  [96.8 F (36 C)-98.1 F (36.7 C)] 97.9 F (36.6 C) (06/12 0753) Pulse Rate:  [48-114] 85 (06/12 0753) Resp:  [0-29] 20 (06/12 0753) BP: (105-132)/(69-95) 114/80 (06/12 0753) SpO2:  [97 %-100 %] 98 % (06/12 0753)  Physical Exam:  General: alert, cooperative and no distress Lochia: appropriate Uterine Fundus: firm Incision: Dressing intact DVT Evaluation: No evidence of DVT seen on physical exam.    Data Review Recent Labs    03/25/18 0803  HGB 11.1*  HCT 32.7*    Assessment:  Active Problems:   Abnormal fetal lie, antepartum   Status post Cesarean section. Doing well postoperatively.     Plan:       Continue current care.    Finis Bud, M.D. 03/26/2018 10:32 AM

## 2018-03-26 NOTE — Anesthesia Post-op Follow-up Note (Signed)
  Anesthesia Pain Follow-up Note  Patient: Holly Meyer  Day #: 1  Date of Follow-up: 03/26/2018 Time: 7:23 AM  Last Vitals:  Vitals:   03/26/18 0016 03/26/18 0333  BP: 116/74 114/69  Pulse: 61   Resp: 16   Temp: 36.6 C 36.7 C  SpO2: 98%     Level of Consciousness: alert  Pain: mild  Side Effects:None  Catheter Site Exam:clean, dry     Plan: D/C from anesthesia care at surgeon's request  Blima Singer

## 2018-03-26 NOTE — Anesthesia Postprocedure Evaluation (Signed)
Anesthesia Post Note  Patient: Holly Meyer  Procedure(s) Performed: PRIMARY CESAREAN SECTION (N/A Abdomen)  Patient location during evaluation: Mother Baby Anesthesia Type: Spinal Level of consciousness: awake and alert and oriented Pain management: satisfactory to patient Vital Signs Assessment: post-procedure vital signs reviewed and stable Respiratory status: respiratory function stable Cardiovascular status: stable Postop Assessment: no backache, no headache, spinal receding, patient able to bend at knees, adequate PO intake, able to ambulate and no apparent nausea or vomiting Anesthetic complications: no     Last Vitals:  Vitals:   03/26/18 0016 03/26/18 0333  BP: 116/74 114/69  Pulse: 61   Resp: 16   Temp: 36.6 C 36.7 C  SpO2: 98%     Last Pain:  Vitals:   03/26/18 0540  TempSrc:   PainSc: 0-No pain                 Blima Singer

## 2018-03-27 LAB — RHOGAM INJECTION: Unit division: 0

## 2018-03-27 MED ORDER — VARICELLA VIRUS VACCINE LIVE 1350 PFU/0.5ML IJ SUSR
0.5000 mL | Freq: Once | INTRAMUSCULAR | Status: AC
  Administered 2018-03-27: 0.5 mL via SUBCUTANEOUS
  Filled 2018-03-27: qty 0.5

## 2018-03-27 NOTE — Discharge Summary (Signed)
Physician Obstetric Discharge Summary  Patient ID: Holly Meyer MRN: [id number] DOB/AGE: 07-Jun-1996 22 y.o.   Date of Admission: 03/25/2018  Date of Discharge: 03/27/2018  Admitting Diagnosis: Scheduled cesarean section at [redacted]w[redacted]d  Mode of Delivery: primary cesarean section            Discharge Diagnosis: No other diagnosis   Post partum procedures:   Complications: none                        Discharge Day SOAP Note:  Subjective:  The patient has no complaints.  She is ambulating well. She is taking PO well. Pain is well controlled with current medications. Patient is urinating without difficulty.   She is passing flatus.    Objective  Vital signs in last 24 hours: BP 120/76   Pulse 77   Temp 98.1 F (36.7 C) (Oral)   Resp 20   Ht 5\' 7"  (1.702 m)   Wt 174 lb (78.9 kg)   LMP 06/06/2017 (Approximate) Comment: irregular bleeding  SpO2 98%   Breastfeeding? Unknown   BMI 27.25 kg/m   Physical Exam: Gen: NAD Abdomen:  clean, dry, no drainage, healing Fundus Fundal Tone: Firm  Lochia Amount: Small     Data Review Labs: CBC Latest Ref Rng & Units 03/25/2018 01/03/2018 08/05/2017  WBC 3.6 - 11.0 K/uL 10.0 9.4 7.6  Hemoglobin 12.0 - 16.0 g/dL 11.1(L) 10.4(L) 12.6  Hematocrit 35.0 - 47.0 % 32.7(L) 32.6(L) 38.9  Platelets 150 - 440 K/uL 293 290 289   O NEG  Assessment:  Active Problems:   Abnormal fetal lie, antepartum   Doing well.  Normal progress as expected.    Plan:  Discharge to home  Modified rest as directed - may slowly resume normal activities with restrictions  as discussed.  Medications as written.  See below for additional.       Discharge Instructions: Per After Visit Summary. Activity: Advance as tolerated. Pelvic rest for 6 weeks.  Also refer to After Visit Summary.  Wound care discussed. Diet: Regular Medications: Allergies as of 03/27/2018      Reactions   Fexofenadine Shortness Of Breath, Other (See Comments)   Respiratory  Distress (ALLERGY/intolerance)   Imitrex [sumatriptan] Other (See Comments)   Symptoms worsened   Guaifenesin Other (See Comments)   Dizziness (intolerance)   Guaifenesin Er Other (See Comments)   Dizziness   Meloxicam Other (See Comments)   Unknown   Naproxen Other (See Comments)   Loss of appiteite      Medication List    STOP taking these medications   cetirizine 10 MG tablet Commonly known as:  ZYRTEC     TAKE these medications   acetaminophen 500 MG tablet Commonly known as:  TYLENOL Take 500 mg by mouth every 6 (six) hours as needed for moderate pain or headache.   calcium carbonate 500 MG chewable tablet Commonly known as:  TUMS - dosed in mg elemental calcium Chew 1 tablet by mouth daily.   multivitamin-prenatal 27-0.8 MG Tabs tablet Take 1 tablet by mouth daily.      Outpatient follow up:  Follow-up Information    Harlin Heys, MD Follow up in 1 week(s).   Specialty:  Obstetrics and Gynecology Contact information: [place] [address] [phone]          Postpartum contraception: Will discuss at first post-partum visit.  Discharged Condition: good  Discharged to: home  Newborn Data: Disposition:home with mother  Apgars: APGAR (1 MIN): 8   APGAR (5 MINS): 8   APGAR (10 MINS):    Baby Feeding: Breast  Finis Bud, M.D. 03/27/2018 8:48 AM

## 2018-03-27 NOTE — Progress Notes (Signed)
Patient discharged home with infant. Discharge instructions and prescriptions given and reviewed with patient. Patient verbalized understanding. Escorted out by auxillary.  

## 2018-04-03 ENCOUNTER — Ambulatory Visit (INDEPENDENT_AMBULATORY_CARE_PROVIDER_SITE_OTHER): Payer: Medicaid Other | Admitting: Obstetrics and Gynecology

## 2018-04-03 ENCOUNTER — Encounter: Payer: Self-pay | Admitting: Obstetrics and Gynecology

## 2018-04-03 VITALS — BP 118/79 | HR 97 | Ht 67.0 in | Wt 152.9 lb

## 2018-04-03 DIAGNOSIS — Z9889 Other specified postprocedural states: Secondary | ICD-10-CM

## 2018-04-03 NOTE — Progress Notes (Signed)
HPI:      [name] is a 22 y.o. G2P1011 who LMP was Patient's last menstrual period was 06/06/2017 (approximate).  Subjective:   She presents today 1 week follow-up from cesarean delivery for breech.  She is doing well.  She has no pain.  Ambulating voiding and eating without difficulty.  No problems with bowel movements. She is breast-feeding full-time.    Hx: The following portions of the patient's history were reviewed and updated as appropriate:             She  has a past medical history of Complication of anesthesia, FHx: diabetes mellitus, Septate uterus, and Venous malformation. She does not have any pertinent problems on file. She  has a past surgical history that includes Wisdom tooth extraction (2012); Dilation and evacuation (N/A, 01/07/2017); and Cesarean section (N/A, 03/25/2018). Her family history includes Alzheimer's disease in her maternal grandmother; Diabetes in her paternal grandfather and paternal grandmother; Heart failure in her paternal grandfather; Hypertension in her father; Migraines in her mother. She  reports that she has quit smoking. Her smoking use included cigarettes. She smoked 0.25 packs per day. She has never used smokeless tobacco. She reports that she drank alcohol. She reports that she does not use drugs. She has a current medication list which includes the following prescription(s): acetaminophen, calcium carbonate, and multivitamin-prenatal. She is allergic to fexofenadine; imitrex [sumatriptan]; guaifenesin; guaifenesin er; meloxicam; and naproxen.       Review of Systems:  Review of Systems  Constitutional: Denied constitutional symptoms, night sweats, recent illness, fatigue, fever, insomnia and weight loss.  Eyes: Denied eye symptoms, eye pain, photophobia, vision change and visual disturbance.  Ears/Nose/Throat/Neck: Denied ear, nose, throat or neck symptoms, hearing loss, nasal discharge, sinus congestion and sore throat.   Cardiovascular: Denied cardiovascular symptoms, arrhythmia, chest pain/pressure, edema, exercise intolerance, orthopnea and palpitations.  Respiratory: Denied pulmonary symptoms, asthma, pleuritic pain, productive sputum, cough, dyspnea and wheezing.  Gastrointestinal: Denied, gastro-esophageal reflux, melena, nausea and vomiting.  Genitourinary: Denied genitourinary symptoms including symptomatic vaginal discharge, pelvic relaxation issues, and urinary complaints.  Musculoskeletal: Denied musculoskeletal symptoms, stiffness, swelling, muscle weakness and myalgia.  Dermatologic: Denied dermatology symptoms, rash and scar.  Neurologic: Denied neurology symptoms, dizziness, headache, neck pain and syncope.  Psychiatric: Denied psychiatric symptoms, anxiety and depression.  Endocrine: Denied endocrine symptoms including hot flashes and night sweats.   Meds:   Current Outpatient Medications on File Prior to Visit  Medication Sig Dispense Refill  . acetaminophen (TYLENOL) 500 MG tablet Take 500 mg by mouth every 6 (six) hours as needed for moderate pain or headache.     . calcium carbonate (TUMS - DOSED IN MG ELEMENTAL CALCIUM) 500 MG chewable tablet Chew 1 tablet by mouth daily.    . Prenatal Vit-Fe Fumarate-FA (MULTIVITAMIN-PRENATAL) 27-0.8 MG TABS tablet Take 1 tablet by mouth daily.      No current facility-administered medications on file prior to visit.     Objective:     Vitals:   04/03/18 0951  BP: 118/79  Pulse: 97               Abdomen: Soft.  Non-tender.  No masses.  No HSM.  Incision/s: Intact.  Healing well.  No erythema.  No drainage.      Assessment:    G2P1011 Patient Active Problem List   Diagnosis Date Noted  . Abnormal fetal lie, antepartum 03/25/2018  . History of miscarriage, currently pregnant, first trimester 08/28/2017  . Rh  negative state in antepartum period 08/28/2017  . Preventative health care 02/01/2017  . Allergic rhinitis 02/28/2016  .  Headache, migraine 08/02/2015  . Back pain 04/28/2015  . Venous malformation 02/28/2012     1. Post-operative state     Patient with excellent recovery   Plan:            1.  Wound care discussed.  Discussed birth control at next visit. Orders No orders of the defined types were placed in this encounter.   No orders of the defined types were placed in this encounter.     F/U  Return in about 5 weeks (around 05/08/2018).  Finis Bud, M.D. 04/03/2018 10:08 AM

## 2018-04-03 NOTE — Progress Notes (Signed)
Pt stated that her c-section incision is healing well no concerns.

## 2018-04-14 ENCOUNTER — Encounter: Payer: Self-pay | Admitting: Obstetrics and Gynecology

## 2018-05-07 ENCOUNTER — Ambulatory Visit (INDEPENDENT_AMBULATORY_CARE_PROVIDER_SITE_OTHER): Payer: Medicaid Other | Admitting: Obstetrics and Gynecology

## 2018-05-07 ENCOUNTER — Encounter: Payer: Self-pay | Admitting: Obstetrics and Gynecology

## 2018-05-07 VITALS — BP 127/77 | HR 89 | Ht 67.0 in | Wt 155.0 lb

## 2018-05-07 DIAGNOSIS — Z9889 Other specified postprocedural states: Secondary | ICD-10-CM

## 2018-05-07 NOTE — Progress Notes (Signed)
HPI:      [name] is a 22 y.o. G2P1011 who LMP was Patient's last menstrual period was 06/06/2017 (approximate).  Subjective:   She presents today approximately 6 weeks postop and postpartum.  She reports no problems.  She is bottlefeeding.  She is having no issues with her incision.  Would like to return to work.  She has not resumed intercourse.  She is considering multiple forms of birth control.    Hx: The following portions of the patient's history were reviewed and updated as appropriate:             She  has a past medical history of Complication of anesthesia, FHx: diabetes mellitus, Septate uterus, and Venous malformation. She does not have any pertinent problems on file. She  has a past surgical history that includes Wisdom tooth extraction (2012); Dilation and evacuation (N/A, 01/07/2017); and Cesarean section (N/A, 03/25/2018). Her family history includes Alzheimer's disease in her maternal grandmother; Diabetes in her paternal grandfather and paternal grandmother; Heart failure in her paternal grandfather; Hypertension in her father; Migraines in her mother. She  reports that she has quit smoking. Her smoking use included cigarettes. She smoked 0.25 packs per day. She has never used smokeless tobacco. She reports that she drank alcohol. She reports that she does not use drugs. She has a current medication list which includes the following prescription(s): acetaminophen, multivitamin-prenatal, and calcium carbonate. She is allergic to fexofenadine; imitrex [sumatriptan]; guaifenesin; guaifenesin er; meloxicam; and naproxen.       Review of Systems:  Review of Systems  Constitutional: Denied constitutional symptoms, night sweats, recent illness, fatigue, fever, insomnia and weight loss.  Eyes: Denied eye symptoms, eye pain, photophobia, vision change and visual disturbance.  Ears/Nose/Throat/Neck: Denied ear, nose, throat or neck symptoms, hearing loss, nasal discharge,  sinus congestion and sore throat.  Cardiovascular: Denied cardiovascular symptoms, arrhythmia, chest pain/pressure, edema, exercise intolerance, orthopnea and palpitations.  Respiratory: Denied pulmonary symptoms, asthma, pleuritic pain, productive sputum, cough, dyspnea and wheezing.  Gastrointestinal: Denied, gastro-esophageal reflux, melena, nausea and vomiting.  Genitourinary: Denied genitourinary symptoms including symptomatic vaginal discharge, pelvic relaxation issues, and urinary complaints.  Musculoskeletal: Denied musculoskeletal symptoms, stiffness, swelling, muscle weakness and myalgia.  Dermatologic: Denied dermatology symptoms, rash and scar.  Neurologic: Denied neurology symptoms, dizziness, headache, neck pain and syncope.  Psychiatric: Denied psychiatric symptoms, anxiety and depression.  Endocrine: Denied endocrine symptoms including hot flashes and night sweats.   Meds:   Current Outpatient Medications on File Prior to Visit  Medication Sig Dispense Refill  . acetaminophen (TYLENOL) 500 MG tablet Take 500 mg by mouth every 6 (six) hours as needed for moderate pain or headache.     . Prenatal Vit-Fe Fumarate-FA (MULTIVITAMIN-PRENATAL) 27-0.8 MG TABS tablet Take 1 tablet by mouth daily.     . calcium carbonate (TUMS - DOSED IN MG ELEMENTAL CALCIUM) 500 MG chewable tablet Chew 1 tablet by mouth daily.     No current facility-administered medications on file prior to visit.     Objective:     Vitals:   05/07/18 1324  BP: 127/77  Pulse: 89               Abdomen: Soft.  Non-tender.  No masses.  No HSM.  Incision/s: Intact.  Healing well.  No erythema.  No drainage.      Assessment:    G2P1011 Patient Active Problem List   Diagnosis Date Noted  . Abnormal fetal lie, antepartum 03/25/2018  .  History of miscarriage, currently pregnant, first trimester 08/28/2017  . Rh negative state in antepartum period 08/28/2017  . Preventative health care 02/01/2017  .  Allergic rhinitis 02/28/2016  . Headache, migraine 08/02/2015  . Back pain 04/28/2015  . Venous malformation 02/28/2012     1. Post-operative state   2. Postpartum care and examination immediately after delivery     Patient doing well   Plan:            1.  Patient may resume normal activities including return to work.  2.  Birth Control I discussed multiple birth control options and methods with the patient.  The risks and benefits of each were reviewed. IUD Literature on Mirena given.  Risks and benefits discussed.  She is considering IUD as an option for birth/cycle control. Patient to inform [place] when she has reached a decision. Orders No orders of the defined types were placed in this encounter.   No orders of the defined types were placed in this encounter.     F/U  No follow-ups on file.  Finis Bud, M.D. 05/07/2018 3:29 PM

## 2018-05-07 NOTE — Progress Notes (Signed)
Pt states no concerns.

## 2018-07-15 ENCOUNTER — Encounter: Payer: Self-pay | Admitting: Primary Care

## 2018-07-15 ENCOUNTER — Ambulatory Visit: Payer: 59 | Admitting: Primary Care

## 2018-07-15 DIAGNOSIS — G43909 Migraine, unspecified, not intractable, without status migrainosus: Secondary | ICD-10-CM | POA: Diagnosis not present

## 2018-07-15 MED ORDER — RIZATRIPTAN BENZOATE 5 MG PO TABS: ORAL_TABLET | ORAL | 0 refills | Status: DC

## 2018-07-15 NOTE — Assessment & Plan Note (Signed)
No problems during pregnancy, now increased since three months post partum. Discussed options for treatment including daily preventative medication vs abortive treatment.  Will start with abortive treatment, switch to Maxalt. If migraines continue to be frequent then will initiate daily preventative treatment. She agrees and will update.

## 2018-07-15 NOTE — Progress Notes (Signed)
Subjective:    Patient ID: Holly Meyer, female    DOB: 1995-12-29, 21 y.o.   MRN: [id number]  HPI  Holly Meyer is a 22 year old female with a history of migraines who presents today with a chief compliant of migraine.   She is three months post partum and since then has had headaches and an increase in migraines. She's had three migraines since 06/26/18. Migraines are located to the bilateral frontal lobes with pressure behind her eyes, also blurry vision. She's been taking Excedrin Migraine and Tylenol without improvement.   She was managed on Imitrex 50 mg which caused worsening of her symptoms and jaw pain. Her last migraine was 3-4 days ago. She does experience photophobia, phonophobia, and nausea with migraines. She has never been on daily treatment for migraine prevention.   Review of Systems  Eyes:       See HPI  Respiratory: Negative for shortness of breath.   Cardiovascular: Negative for chest pain.  Neurological: Positive for headaches.       Past Medical History:  Diagnosis Date  . Complication of anesthesia    complaints of nausea and vomitting when wisdom teeth were removed  . FHx: diabetes mellitus   . Septate uterus   . Venous malformation    Right paraspinal     Social History   Socioeconomic History  . Marital status: Married    Spouse name: Not on file  . Number of children: Not on file  . Years of education: College  . Highest education level: Associate degree: academic program  Occupational History  . Not on file  Social Needs  . Financial resource strain: Not on file  . Food insecurity:    Worry: Not on file    Inability: Not on file  . Transportation needs:    Medical: Not on file    Non-medical: Not on file  Tobacco Use  . Smoking status: Former Smoker    Packs/day: 0.25    Types: Cigarettes  . Smokeless tobacco: Never Used  . Tobacco comment: recently quit  Substance and Sexual Activity  . Alcohol use: Not Currently   Alcohol/week: 0.0 standard drinks    Comment: socially  . Drug use: No  . Sexual activity: Not Currently    Partners: Male    Birth control/protection: None  Lifestyle  . Physical activity:    Days per week: Not on file    Minutes per session: Not on file  . Stress: Not on file  Relationships  . Social connections:    Talks on phone: Not on file    Gets together: Not on file    Attends religious service: Not on file    Active member of club or organization: Not on file    Attends meetings of clubs or organizations: Not on file    Relationship status: Not on file  . Intimate partner violence:    Fear of current or ex partner: Not on file    Emotionally abused: Not on file    Physically abused: Not on file    Forced sexual activity: Not on file  Other Topics Concern  . 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   Works at [employer].    Enjoys swimming, watching movies.    Past Surgical History:  Procedure Laterality Date  . CESAREAN SECTION N/A 03/25/2018  Procedure: PRIMARY CESAREAN SECTION;  Surgeon: Harlin Heys, MD;  Location: [facility] ORS;  Service: Obstetrics;  Laterality: N/A;  Female born @ [birth year]    . DILATION AND EVACUATION N/A 01/07/2017   Procedure: DILATATION AND EVACUATION;  Surgeon: Harlin Heys, MD;  Location: [facility] ORS;  Service: Gynecology;  Laterality: N/A;  . WISDOM TOOTH EXTRACTION  2012    Family History  Problem Relation Age of Onset  . Hypertension Father   . Migraines Mother   . Diabetes Paternal Grandmother        family history  . Heart failure Paternal Grandfather   . Diabetes Paternal Grandfather   . Alzheimer's disease Maternal Grandmother     Allergies  Allergen Reactions  . Fexofenadine Shortness Of Breath and Other (See Comments)    Respiratory Distress (ALLERGY/intolerance)  . Imitrex [Sumatriptan] Other (See  Comments)    Symptoms worsened  . Guaifenesin Other (See Comments)    Dizziness (intolerance)  . Guaifenesin Er Other (See Comments)    Dizziness  . Meloxicam Other (See Comments)    Unknown  . Naproxen Other (See Comments)    Loss of appiteite    Current Outpatient Medications on File Prior to Visit  Medication Sig Dispense Refill  . acetaminophen (TYLENOL) 500 MG tablet Take 500 mg by mouth every 6 (six) hours as needed for moderate pain or headache.     . calcium carbonate (TUMS - DOSED IN MG ELEMENTAL CALCIUM) 500 MG chewable tablet Chew 1 tablet by mouth daily.    . Prenatal Vit-Fe Fumarate-FA (MULTIVITAMIN-PRENATAL) 27-0.8 MG TABS tablet Take 1 tablet by mouth daily.      No current facility-administered medications on file prior to visit.     BP 118/76   Pulse 83   Temp 98 F (36.7 C) (Oral)   Ht 5\' 7"  (1.702 m)   Wt 155 lb (70.3 kg)   LMP 06/15/2018   SpO2 98%   Breastfeeding? No   BMI 24.28 kg/m    Objective:   Physical Exam  Constitutional: She appears well-nourished.  Neck: Neck supple.  Cardiovascular: Normal rate and regular rhythm.  Respiratory: Effort normal and breath sounds normal.  Skin: Skin is warm and dry.           Assessment & Plan:

## 2018-07-15 NOTE — Patient Instructions (Addendum)
You may take rizaptriptan 5 mg (Maxalt) for migraines. Take 1 tablet at migraine onset, may repeat in 2 hours if migraine persists. Do not exceed 2 doses in 24 hours.  Please notify me if no improvement in migraines with Maxalt. Please also notify me if migraines continue to be frequent.  It was a pleasure to see you today!

## 2018-07-25 ENCOUNTER — Ambulatory Visit: Payer: 59 | Admitting: Family Medicine

## 2018-07-25 ENCOUNTER — Encounter: Payer: Self-pay | Admitting: Family Medicine

## 2018-07-25 VITALS — BP 120/88 | HR 68 | Temp 98.0°F | Ht 67.0 in | Wt 159.4 lb

## 2018-07-25 DIAGNOSIS — J020 Streptococcal pharyngitis: Secondary | ICD-10-CM | POA: Diagnosis not present

## 2018-07-25 DIAGNOSIS — J029 Acute pharyngitis, unspecified: Secondary | ICD-10-CM | POA: Diagnosis not present

## 2018-07-25 LAB — POCT RAPID STREP A

## 2018-07-25 MED ORDER — AMOXICILLIN 500 MG PO CAPS: 500.0000 mg | ORAL_CAPSULE | Freq: Three times a day (TID) | ORAL | 0 refills | Status: DC

## 2018-07-25 NOTE — Progress Notes (Signed)
Subjective:    Patient ID: Holly Meyer, female    DOB: [birth year], 22 y.o.   MRN: [id number]  HPI This is a 22 yo female who presents today with 5 days of cough, sore throat, hoarse voice. No fever. Clear phlegm. No ear pain, some headache. Tylenol with some relief. No known sick contacts.   Past Medical History:  Diagnosis Date  . Complication of anesthesia    complaints of nausea and vomitting when wisdom teeth were removed  . FHx: diabetes mellitus   . Septate uterus   . Venous malformation    Right paraspinal   Past Surgical History:  Procedure Laterality Date  . CESAREAN SECTION N/A 03/25/2018   Procedure: PRIMARY CESAREAN SECTION;  Surgeon: Harlin Heys, MD;  Location: [facility] ORS;  Service: Obstetrics;  Laterality: N/A;  Female born @ [birth year]    . DILATION AND EVACUATION N/A 01/07/2017   Procedure: DILATATION AND EVACUATION;  Surgeon: Harlin Heys, MD;  Location: [facility] ORS;  Service: Gynecology;  Laterality: N/A;  . WISDOM TOOTH EXTRACTION  2012   Family History  Problem Relation Age of Onset  . Hypertension Father   . Migraines Mother   . Diabetes Paternal Grandmother        family history  . Heart failure Paternal Grandfather   . Diabetes Paternal Grandfather   . Alzheimer's disease Maternal Grandmother    Social History   Tobacco Use  . Smoking status: Former Smoker    Packs/day: 0.25    Types: Cigarettes  . Smokeless tobacco: Never Used  . Tobacco comment: recently quit  Substance Use Topics  . Alcohol use: Not Currently    Alcohol/week: 0.0 standard drinks    Comment: socially  . Drug use: No      Review of Systems Per HPI    Objective:   Physical Exam  Constitutional: She is oriented to person, place, and time. She appears well-developed and well-nourished.  HENT:  Head: Normocephalic and atraumatic.  Right Ear: Tympanic membrane and ear canal normal.  Left Ear: Tympanic membrane and ear canal normal.  Mouth/Throat: Mucous membranes  are normal. No uvula swelling. Posterior oropharyngeal erythema present. No oropharyngeal exudate, posterior oropharyngeal edema or tonsillar abscesses. Tonsils are 0 on the right. Tonsils are 0 on the left. No tonsillar exudate.  Eyes: EOM are normal.  Neck: Neck supple.  Cardiovascular: Normal rate, regular rhythm and normal heart sounds.  Pulmonary/Chest: Effort normal and breath sounds normal.  Lymphadenopathy:    She has no cervical adenopathy.  Neurological: She is alert and oriented to person, place, and time.  Skin: Skin is warm and dry.  Psychiatric: She has a normal mood and affect. Her behavior is normal.      BP 120/88 (BP Location: Right Arm, Patient Position: Sitting, Cuff Size: Normal)   Pulse 68   Temp 98 F (36.7 C) (Oral)   Ht 5\' 7"  (1.702 m)   Wt 159 lb 6.4 oz (72.3 kg)   LMP 07/21/2018   SpO2 97%   BMI 24.97 kg/m      Results for orders placed or performed in visit on 07/25/18  POCT Rapid Strep A  Result Value Ref Range   Positive      Assessment & Plan:  1. Sore throat - POCT Rapid Strep A  2. Strep pharyngitis - Provided written and verbal information regarding diagnosis and treatment. - RTC/ER precautions reviewed - amoxicillin (AMOXIL) 500 MG capsule; Take 1 capsule (500 mg total)  by mouth 3 (three) times daily.  Dispense: 30 capsule; Refill: 0   Clarene Reamer, FNP-BC  [facility]  07/27/2018 6:14 PM

## 2018-07-25 NOTE — Patient Instructions (Signed)

## 2018-07-27 ENCOUNTER — Encounter: Payer: Self-pay | Admitting: Family Medicine

## 2018-07-29 ENCOUNTER — Ambulatory Visit: Payer: Medicaid Other | Admitting: Primary Care

## 2019-03-02 ENCOUNTER — Encounter: Payer: Self-pay | Admitting: Primary Care

## 2019-03-02 ENCOUNTER — Ambulatory Visit (INDEPENDENT_AMBULATORY_CARE_PROVIDER_SITE_OTHER): Payer: Self-pay | Admitting: Primary Care

## 2019-03-02 ENCOUNTER — Other Ambulatory Visit: Payer: Self-pay

## 2019-03-02 DIAGNOSIS — D229 Melanocytic nevi, unspecified: Secondary | ICD-10-CM

## 2019-03-02 NOTE — Progress Notes (Signed)
Subjective:    Patient ID: Holly Meyer, female    DOB: 01/20/1996, 23 y.o.   MRN: [id number]  HPI  Virtual Visit via Video Note  I connected with [name] on 03/02/19 at  4:00 PM EDT by a video enabled telemedicine application and verified that I am speaking with the correct person using two identifiers.  Location: Patient: Home Provider: Office   I discussed the limitations of evaluation and management by telemedicine and the availability of in person appointments. The patient expressed understanding and agreed to proceed.  History of Present Illness:  [name] is a 23 year old female who presents today with a chief complaint of nevus.  She noticed a dark spot pop up to the left inner thigh about 4-5 months ago. The spot began as a dark round, flat area. Since then she's noticed a slight increase in size, otherwise no change. She denies changes in shape, color, texture.   Observations/Objective:  Alert and oriented. Appears well, not sickly. No distress. Speaking in complete sentences.  Rounded, flat, dark brown nevus to left inner thigh. No erythema, scaling, crater  Assessment and Plan:  Spot on the thigh appears to be a benign nevus. Discussed to monitor the nevus for changes in shape/color/texture. She will update.   Follow Up Instructions:  Monitor the site of the mole for any changes in shape/color/texture.  It was a pleasure to see you today! [name], NP-C    I discussed the assessment and treatment plan with the patient. The patient was provided an opportunity to ask questions and all were answered. The patient agreed with the plan and demonstrated an understanding of the instructions.   The patient was advised to call back or seek an in-person evaluation if the symptoms worsen or if the condition fails to improve as anticipated.     [name], NP    Review of Systems  Constitutional: Negative for fever.  Skin: Negative  for rash and wound.       Nevus        Past Medical History:  Diagnosis Date  . Complication of anesthesia    complaints of nausea and vomitting when wisdom teeth were removed  . FHx: diabetes mellitus   . Septate uterus   . Venous malformation    Right paraspinal     Social History   Socioeconomic History  . Marital status: Married    Spouse name: Not on file  . Number of children: Not on file  . Years of education: College  . Highest education level: Associate degree: academic program  Occupational History  . Not on file  Social Needs  . Financial resource strain: Not on file  . Food insecurity:    Worry: Not on file    Inability: Not on file  . Transportation needs:    Medical: Not on file    Non-medical: Not on file  Tobacco Use  . Smoking status: Former Smoker    Packs/day: 0.25    Types: Cigarettes  . Smokeless tobacco: Never Used  . Tobacco comment: recently quit  Substance and Sexual Activity  . Alcohol use: Not Currently    Alcohol/week: 0.0 standard drinks    Comment: socially  . Drug use: No  . Sexual activity: Not Currently    Partners: Male    Birth control/protection: None  Lifestyle  . Physical activity:    Days per week: Not on file    Minutes per session:  Not on file  . Stress: Not on file  Relationships  . Social connections:    Talks on phone: Not on file    Gets together: Not on file    Attends religious service: Not on file    Active member of club or organization: Not on file    Attends meetings of clubs or organizations: Not on file    Relationship status: Not on file  . Intimate partner violence:    Fear of current or ex partner: Not on file    Emotionally abused: Not on file    Physically abused: Not on file    Forced sexual activity: Not on file  Other Topics Concern  . 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    Works at [employer].    Enjoys swimming, watching movies.    Past Surgical History:  Procedure Laterality Date  . CESAREAN SECTION N/A 03/25/2018   Procedure: PRIMARY CESAREAN SECTION;  Surgeon: Harlin Heys, MD;  Location: [facility] OR[facility];  Service: Obstetrics;  Laterality: N/A;  Female born @ 72    . DILATION AND EVACUATION N/A 01/07/2017   Procedure: DILATATION AND EVACUATION;  Surgeon: Harlin Heys, MD;  Location: [facility] OR[facility];  Service: Gynecology;  Laterality: N/A;  . WISDOM TOOTH EXTRACTION  2012    Family History  Problem Relation Age of Onset  . Hypertension Father   . Migraines Mother   . Diabetes Paternal Grandmother        family history  . Heart failure Paternal Grandfather   . Diabetes Paternal Grandfather   . Alzheimer's disease Maternal Grandmother     Allergies  Allergen Reactions  . Fexofenadine Shortness Of Breath and Other (See Comments)    Respiratory Distress (ALLERGY/intolerance)  . Imitrex [Sumatriptan] Other (See Comments)    Symptoms worsened  . Guaifenesin Other (See Comments)    Dizziness (intolerance)  . Guaifenesin Er Other (See Comments)    Dizziness  . Meloxicam Other (See Comments)    Unknown  . Naproxen Other (See Comments)    Loss of appiteite    Current Outpatient Medications on File Prior to Visit  Medication Sig Dispense Refill  . acetaminophen (TYLENOL) 500 MG tablet Take 500 mg by mouth every 6 (six) hours as needed for moderate pain or headache.     . calcium carbonate (TUMS - DOSED IN MG ELEMENTAL CALCIUM) 500 MG chewable tablet Chew 1 tablet by mouth daily.    . Prenatal Vit-Fe Fumarate-FA (MULTIVITAMIN-PRENATAL) 27-0.8 MG TABS tablet Take 1 tablet by mouth daily.     . rizatriptan (MAXALT) 5 MG tablet Take 1 tablet at migraine onset. May repeat in 2 hours if needed. Do not exceed 2 doses in 24 hours. 10 tablet 0   No current facility-administered medications on file prior to visit.     LMP 02/13/2019     Objective:   Physical Exam  Constitutional: She is oriented to person, place, and time. She appears well-nourished.  Respiratory: Effort normal.  Neurological: She is alert and oriented to person, place, and time.  Skin: Skin is dry.  Spot on the thigh appears to be a benign nevus. Discussed to monitor the nevus for changes in shape/color/texture. She will update.             Assessment & Plan:

## 2019-03-02 NOTE — Patient Instructions (Signed)
Monitor the site of the mole for any changes in shape/color/texture.  It was a pleasure to see you today! [name], NP-C

## 2019-10-05 ENCOUNTER — Ambulatory Visit: Payer: Medicaid Other | Attending: Internal Medicine

## 2019-10-05 DIAGNOSIS — Z20822 Contact with and (suspected) exposure to covid-19: Secondary | ICD-10-CM

## 2019-10-06 LAB — NOVEL CORONAVIRUS, NAA: SARS-CoV-2, NAA: NOT DETECTED

## 2020-02-15 ENCOUNTER — Ambulatory Visit: Payer: 59 | Admitting: Primary Care

## 2020-04-05 ENCOUNTER — Other Ambulatory Visit: Payer: Self-pay

## 2020-04-05 ENCOUNTER — Ambulatory Visit (INDEPENDENT_AMBULATORY_CARE_PROVIDER_SITE_OTHER): Payer: 59 | Admitting: Obstetrics and Gynecology

## 2020-04-05 ENCOUNTER — Encounter: Payer: Self-pay | Admitting: Obstetrics and Gynecology

## 2020-04-05 ENCOUNTER — Other Ambulatory Visit (HOSPITAL_COMMUNITY)
Admission: RE | Admit: 2020-04-05 | Discharge: 2020-04-05 | Disposition: A | Discharge status: Home / Self Care | Payer: 59 | Source: Ambulatory Visit | Attending: Obstetrics and Gynecology | Admitting: Obstetrics and Gynecology

## 2020-04-05 VITALS — BP 133/90 | HR 84 | Ht 67.0 in | Wt 160.3 lb

## 2020-04-05 DIAGNOSIS — Z124 Encounter for screening for malignant neoplasm of cervix: Secondary | ICD-10-CM | POA: Diagnosis not present

## 2020-04-05 DIAGNOSIS — R300 Dysuria: Secondary | ICD-10-CM | POA: Diagnosis not present

## 2020-04-05 DIAGNOSIS — Z01419 Encounter for gynecological examination (general) (routine) without abnormal findings: Secondary | ICD-10-CM | POA: Diagnosis not present

## 2020-04-05 LAB — POCT URINALYSIS DIPSTICK OB
Bilirubin, UA: NEGATIVE
Glucose, UA: NEGATIVE
Ketones, UA: NEGATIVE
Leukocytes, UA: NEGATIVE
Nitrite, UA: NEGATIVE
POC,PROTEIN,UA: NEGATIVE
Spec Grav, UA: 1.01 (ref 1.010–1.025)
Urobilinogen, UA: 0.2 E.U./dL
pH, UA: 6.5 (ref 5.0–8.0)

## 2020-04-05 MED ORDER — NITROFURANTOIN MONOHYD MACRO 100 MG PO CAPS: 100.0000 mg | ORAL_CAPSULE | Freq: Two times a day (BID) | ORAL | 1 refills | Status: DC

## 2020-04-05 NOTE — Addendum Note (Signed)
Addended by: Durwin Glaze on: 04/05/2020 08:26 AM   Modules accepted: Orders

## 2020-04-05 NOTE — Progress Notes (Signed)
HPI:      [name] is a 24 y.o. G2P1011 who LMP was Patient's last menstrual period was 03/31/2020.  Subjective:   She presents today for her annual examination.  She is having normal regular menstrual cycles.  She and her husband are considering having another child.  They are currently using withdrawal method to prevent pregnancy.    Hx: The following portions of the patient's history were reviewed and updated as appropriate:             She  has a past medical history of Complication of anesthesia, FHx: diabetes mellitus, Septate uterus, and Venous malformation. She does not have any pertinent problems on file. She  has a past surgical history that includes Wisdom tooth extraction (2012); Dilation and evacuation (N/A, 01/07/2017); and Cesarean section (N/A, 03/25/2018). Her family history includes Alzheimer's disease in her maternal grandmother; Diabetes in her paternal grandfather and paternal grandmother; Heart failure in her paternal grandfather; Hypertension in her father; Migraines in her mother. She  reports that she has quit smoking. Her smoking use included cigarettes. She smoked 0.25 packs per day. She has never used smokeless tobacco. She reports previous alcohol use. She reports that she does not use drugs. She has a current medication list which includes the following prescription(s): acetaminophen, calcium carbonate, nitrofurantoin (macrocrystal-monohydrate), multivitamin-prenatal, and rizatriptan. She is allergic to fexofenadine, imitrex [sumatriptan], guaifenesin, guaifenesin er, meloxicam, and naproxen.       Review of Systems:  Review of Systems  Constitutional: Denied constitutional symptoms, night sweats, recent illness, fatigue, fever, insomnia and weight loss.  Eyes: Denied eye symptoms, eye pain, photophobia, vision change and visual disturbance.  Ears/Nose/Throat/Neck: Denied ear, nose, throat or neck symptoms, hearing loss, nasal discharge, sinus congestion  and sore throat.  Cardiovascular: Denied cardiovascular symptoms, arrhythmia, chest pain/pressure, edema, exercise intolerance, orthopnea and palpitations.  Respiratory: Denied pulmonary symptoms, asthma, pleuritic pain, productive sputum, cough, dyspnea and wheezing.  Gastrointestinal: Denied, gastro-esophageal reflux, melena, nausea and vomiting.  Genitourinary: Denied genitourinary symptoms including symptomatic vaginal discharge, pelvic relaxation issues, and urinary complaints.  Musculoskeletal: Denied musculoskeletal symptoms, stiffness, swelling, muscle weakness and myalgia.  Dermatologic: Denied dermatology symptoms, rash and scar.  Neurologic: Denied neurology symptoms, dizziness, headache, neck pain and syncope.  Psychiatric: Denied psychiatric symptoms, anxiety and depression.  Endocrine: Denied endocrine symptoms including hot flashes and night sweats.   Meds:   Current Outpatient Medications on File Prior to Visit  Medication Sig Dispense Refill  . acetaminophen (TYLENOL) 500 MG tablet Take 500 mg by mouth every 6 (six) hours as needed for moderate pain or headache.     . calcium carbonate (TUMS - DOSED IN MG ELEMENTAL CALCIUM) 500 MG chewable tablet Chew 1 tablet by mouth daily.    . Prenatal Vit-Fe Fumarate-FA (MULTIVITAMIN-PRENATAL) 27-0.8 MG TABS tablet Take 1 tablet by mouth daily.  (Patient not taking: Reported on 04/05/2020)    . rizatriptan (MAXALT) 5 MG tablet Take 1 tablet at migraine onset. May repeat in 2 hours if needed. Do not exceed 2 doses in 24 hours. (Patient not taking: Reported on 04/05/2020) 10 tablet 0   No current facility-administered medications on file prior to visit.    Objective:     Vitals:   04/05/20 0743  BP: 133/90  Pulse: 84              Physical examination General NAD, Conversant  HEENT Atraumatic; Op clear with mmm.  Normo-cephalic. Pupils reactive. Anicteric sclerae  Thyroid/Neck Smooth without  nodularity or enlargement. Normal ROM.   Neck Supple.  Skin No rashes, lesions or ulceration. Normal palpated skin turgor. No nodularity.  Breasts: No masses or discharge.  Symmetric.  No axillary adenopathy.  Lungs: Clear to auscultation.No rales or wheezes. Normal Respiratory effort, no retractions.  Heart: NSR.  No murmurs or rubs appreciated. No periferal edema  Abdomen: Soft.  Non-tender.  No masses.  No HSM. No hernia  Extremities: Moves all appropriately.  Normal ROM for age. No lymphadenopathy.  Neuro: Oriented to PPT.  Normal mood. Normal affect.     Pelvic:   Vulva: Normal appearance.  No lesions.  Vagina: No lesions or abnormalities noted.  Support: Normal pelvic support.  Urethra No masses tenderness or scarring.  Meatus Normal size without lesions or prolapse.  Cervix: Normal appearance.  No lesions.  Anus: Normal exam.  No lesions.  Perineum: Normal exam.  No lesions.        Bimanual   Uterus: Normal size.  Non-tender.  Mobile.  AV.  Adnexae: No masses.  Non-tender to palpation.  Cul-de-sac: Negative for abnormality.      Assessment:    G2P1011 Patient Active Problem List   Diagnosis Date Noted  . Abnormal fetal lie, antepartum 03/25/2018  . History of miscarriage, currently pregnant, first trimester 08/28/2017  . Rh negative state in antepartum period 08/28/2017  . Preventative health care 02/01/2017  . Allergic rhinitis 02/28/2016  . Headache, migraine 08/02/2015  . Back pain 04/28/2015  . Venous malformation 02/28/2012     1. Well woman exam with routine gynecological exam   2. Dysuria   3. Screening for cervical cancer     UA seems consistent with UTI.  Patient states she was treated recently and felt like it did not resolve but when she spoke with the physician who treated her she was assured it had to have resolved.   Plan:            1.  Basic Screening Recommendations The basic screening recommendations for asymptomatic women were discussed with the patient during her visit.  The  age-appropriate recommendations were discussed with her and the rational for the tests reviewed.  When I am informed by the patient that another primary care physician has previously obtained the age-appropriate tests and they are up-to-date, only outstanding tests are ordered and referrals given as necessary.  Abnormal results of tests will be discussed with her when all of her results are completed.  Routine preventative health maintenance measures emphasized: Exercise/Diet/Weight control, Tobacco Warnings, Alcohol/Substance use risks and Stress Management Pap performed-GC/CT. 2.  Treat for UTI and send urine for C&S 3.  Prepregnancy counseling given timing of intercourse and use of prenatal vitamins. Orders Orders Placed This Encounter  Procedures  . POC Urinalysis Dipstick OB     Meds ordered this encounter  Medications  . nitrofurantoin, macrocrystal-monohydrate, (MACROBID) 100 MG capsule    Sig: Take 1 capsule (100 mg total) by mouth 2 (two) times daily.    Dispense:  14 capsule    Refill:  1        F/U  Return in about 1 year (around 04/05/2021) for Annual Physical.  Finis Bud, M.D. 04/05/2020 8:20 AM

## 2020-04-06 LAB — CYTOLOGY - PAP: Diagnosis: NEGATIVE

## 2020-04-07 LAB — URINE CULTURE: Organism ID, Bacteria: NO GROWTH

## 2020-07-21 ENCOUNTER — Other Ambulatory Visit: Payer: Self-pay

## 2020-07-21 ENCOUNTER — Ambulatory Visit
Admission: RE | Admit: 2020-07-21 | Discharge: 2020-07-21 | Disposition: A | Discharge status: Home / Self Care | Payer: 59 | Source: Ambulatory Visit | Attending: Family Medicine | Admitting: Family Medicine

## 2020-07-21 VITALS — BP 130/85 | HR 81 | Temp 98.8°F | Resp 18 | Ht 67.0 in | Wt 158.0 lb

## 2020-07-21 DIAGNOSIS — R6883 Chills (without fever): Secondary | ICD-10-CM | POA: Insufficient documentation

## 2020-07-21 DIAGNOSIS — R52 Pain, unspecified: Secondary | ICD-10-CM | POA: Insufficient documentation

## 2020-07-21 DIAGNOSIS — R509 Fever, unspecified: Secondary | ICD-10-CM | POA: Insufficient documentation

## 2020-07-21 DIAGNOSIS — J039 Acute tonsillitis, unspecified: Secondary | ICD-10-CM | POA: Diagnosis not present

## 2020-07-21 DIAGNOSIS — R5383 Other fatigue: Secondary | ICD-10-CM | POA: Diagnosis present

## 2020-07-21 LAB — POCT RAPID STREP A (OFFICE): Rapid Strep A Screen: NEGATIVE

## 2020-07-21 MED ORDER — AMOXICILLIN 500 MG PO TABS: 500.0000 mg | ORAL_TABLET | Freq: Two times a day (BID) | ORAL | 0 refills | Status: AC

## 2020-07-21 NOTE — ED Provider Notes (Signed)
[facility]   [id number] 07/21/20 Arrival Time: 2119  ER:[facility] THROAT  SUBJECTIVE: History from: patient.  Holly Meyer is a 24 y.o. female who presents with abrupt onset of sore throat for last 3 days.  Reports that she has white patches, fever and chills as well.  Reports that she is prone to strep.  Denies sick exposure to Covid, strep, flu or mono, or precipitating event.  Has taken Tylenol without relief. Has positive history of Covid. Has completed Covid vaccines. Symptoms are made worse with swallowing, but tolerating liquids and own secretions without difficulty.  Denies previous symptoms in the past.     Denies  ear pain, sinus pain, rhinorrhea, nasal congestion, cough, SOB, wheezing, chest pain, nausea, rash, changes in bowel or bladder habits.     ROS: As per HPI.  All other pertinent ROS negative.     Past Medical History:  Diagnosis Date  . Complication of anesthesia    complaints of nausea and vomitting when wisdom teeth were removed  . FHx: diabetes mellitus   . Septate uterus   . Venous malformation    Right paraspinal   Past Surgical History:  Procedure Laterality Date  . CESAREAN SECTION N/A 03/25/2018   Procedure: PRIMARY CESAREAN SECTION;  Surgeon: Harlin Heys, MD;  Location: [facility] OR[facility];  Service: Obstetrics;  Laterality: N/A;  Female born @ 12    . DILATION AND EVACUATION N/A 01/07/2017   Procedure: DILATATION AND EVACUATION;  Surgeon: Harlin Heys, MD;  Location: [facility] OR[facility];  Service: Gynecology;  Laterality: N/A;  . WISDOM TOOTH EXTRACTION  2012   Allergies  Allergen Reactions  . Fexofenadine Shortness Of Breath and Other (See Comments)    Respiratory Distress (ALLERGY/intolerance)  . Imitrex [Sumatriptan] Other (See Comments)    Symptoms worsened  . Guaifenesin Other (See Comments)    Dizziness (intolerance)  . Guaifenesin Er Other (See Comments)    Dizziness  . Meloxicam Other (See Comments)    Unknown  . Naproxen Other  (See Comments)    Loss of appiteite   No current facility-administered medications on file prior to encounter.   Current Outpatient Medications on File Prior to Encounter  Medication Sig Dispense Refill  . acetaminophen (TYLENOL) 500 MG tablet Take 500 mg by mouth every 6 (six) hours as needed for moderate pain or headache.     . calcium carbonate (TUMS - DOSED IN MG ELEMENTAL CALCIUM) 500 MG chewable tablet Chew 1 tablet by mouth daily.    . nitrofurantoin, macrocrystal-monohydrate, (MACROBID) 100 MG capsule Take 1 capsule (100 mg total) by mouth 2 (two) times daily. 14 capsule 1  . Prenatal Vit-Fe Fumarate-FA (MULTIVITAMIN-PRENATAL) 27-0.8 MG TABS tablet Take 1 tablet by mouth daily.  (Patient not taking: Reported on 04/05/2020)    . rizatriptan (MAXALT) 5 MG tablet Take 1 tablet at migraine onset. May repeat in 2 hours if needed. Do not exceed 2 doses in 24 hours. (Patient not taking: Reported on 04/05/2020) 10 tablet 0   Social History   Socioeconomic History  . Marital status: Married    Spouse name: Not on file  . Number of children: Not on file  . Years of education: College  . Highest education level: Associate degree: academic program  Occupational History  . Not on file  Tobacco Use  . Smoking status: Former Smoker    Packs/day: 0.25    Types: Cigarettes  . Smokeless tobacco: Never Used  . Tobacco comment: recently quit  Vaping  Use  . Vaping Use: Never used  Substance and Sexual Activity  . Alcohol use: Not Currently    Alcohol/week: 0.0 standard drinks    Comment: socially  . Drug use: No  . Sexual activity: Not Currently    Partners: Male    Birth control/protection: None  Other Topics Concern  . 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   Works at [employer].    Enjoys swimming, watching movies.   Social Determinants of Health   Financial  Resource Strain:   . Difficulty of Paying Living Expenses: Not on file  Food Insecurity:   . Worried About Charity fundraiser in the Last Year: Not on file  . Ran Out of Food in the Last Year: Not on file  Transportation Needs:   . Lack of Transportation (Medical): Not on file  . Lack of Transportation (Non-Medical): Not on file  Physical Activity:   . Days of Exercise per Week: Not on file  . Minutes of Exercise per Session: Not on file  Stress:   . Feeling of Stress : Not on file  Social Connections:   . Frequency of Communication with Friends and Family: Not on file  . Frequency of Social Gatherings with Friends and Family: Not on file  . Attends Religious Services: Not on file  . Active Member of Clubs or Organizations: Not on file  . Attends Archivist Meetings: Not on file  . Marital Status: Not on file  Intimate Partner Violence:   . Fear of Current or Ex-Partner: Not on file  . Emotionally Abused: Not on file  . Physically Abused: Not on file  . Sexually Abused: Not on file   Family History  Problem Relation Age of Onset  . Hypertension Father   . Migraines Mother   . Diabetes Paternal Grandmother        family history  . Heart failure Paternal Grandfather   . Diabetes Paternal Grandfather   . Alzheimer's disease Maternal Grandmother     OBJECTIVE:  Vitals:   07/21/20 1032 07/21/20 1036  BP:  130/85  Pulse:  81  Resp:  18  Temp:  98.8 F (37.1 C)  TempSrc:  Oral  SpO2:  98%  Weight: 158 lb (71.7 kg)   Height: 5\' 7"  (1.702 m)      General appearance: alert; appears fatigued, but nontoxic, speaking in full sentences and managing own secretions HEENT: NCAT; Ears: EACs clear, TMs pearly gray with visible cone of light, without erythema; Eyes: PERRL, EOMI grossly; Nose: no obvious rhinorrhea; Throat: oropharynx clear, tonsils 1+ and mildly erythematous without white tonsillar exudates, uvula midline Neck: supple without LAD Lungs: CTA bilaterally  without adventitious breath sounds; cough absent Heart: regular rate and rhythm.  Radial pulses 2+ symmetrical bilaterally Skin: warm and dry Psychological: alert and cooperative; normal mood and affect  LABS: Results for orders placed or performed during the hospital encounter of 07/21/20 (from the past 24 hour(s))  POCT rapid strep A     Status: None   Collection Time: 07/21/20 10:35 AM  Result Value Ref Range   Rapid Strep A Screen Negative Negative     ASSESSMENT & PLAN:  1. Acute tonsillitis, unspecified etiology   2. Fever, unspecified fever cause   3. Other fatigue   4. Chills   5. Aches  Meds ordered this encounter  Medications  . amoxicillin (AMOXIL) 500 MG tablet    Sig: Take 1 tablet (500 mg total) by mouth 2 (two) times daily for 7 days.    Dispense:  14 tablet    Refill:  0    Order Specific Question:   Supervising Provider    Answer:   Chase Picket [id number]    Strep test negative, will send out for culture and we will call you with results Will treat for tonsillitis Prescribed amoxicillin Get plenty of rest and push fluids Take OTC Zyrtec and use chloraseptic spray as needed for throat pain. Drink warm or cool liquids, use throat lozenges, or popsicles to help alleviate symptoms Take OTC ibuprofen or tylenol as needed for pain Follow up with PCP if symptoms persists Return or go to ER if patient has any new or worsening symptoms such as fever, chills, nausea, vomiting, worsening sore throat, cough, abdominal pain, chest pain, changes in bowel or bladder habits  Reviewed expectations re: course of current medical issues. Questions answered. Outlined signs and symptoms indicating need for more acute intervention. Patient verbalized understanding. After Visit Summary given.          Faustino Congress, NP 07/21/20 1212

## 2020-07-21 NOTE — ED Triage Notes (Signed)
Patient complains of sore throat with white patches, fever and chills. States that she typically has strep often.

## 2020-07-21 NOTE — Discharge Instructions (Addendum)
Your rapid strep test is negative.  A throat culture is pending; we will call you if it is positive requiring treatment.    When to go ahead and treat you for tonsillitis today with amoxicillin.  Take this twice a day for 7 days.  Follow-up with this office or with primary care if symptoms are persisting past Monday.  Follow-up with the ER for trouble swallowing, high fever, trouble breathing, other concerning symptoms

## 2020-07-24 LAB — CULTURE, GROUP A STREP (THRC)

## 2020-10-04 ENCOUNTER — Ambulatory Visit: Payer: Self-pay

## 2020-10-05 ENCOUNTER — Other Ambulatory Visit: Payer: Self-pay

## 2020-10-05 ENCOUNTER — Ambulatory Visit
Admission: EM | Admit: 2020-10-05 | Discharge: 2020-10-05 | Disposition: A | Discharge status: Home / Self Care | Payer: 59 | Attending: Emergency Medicine | Admitting: Emergency Medicine

## 2020-10-05 DIAGNOSIS — N3001 Acute cystitis with hematuria: Secondary | ICD-10-CM | POA: Insufficient documentation

## 2020-10-05 LAB — POCT URINE PREGNANCY: Preg Test, Ur: NEGATIVE

## 2020-10-05 LAB — POCT URINALYSIS DIP (MANUAL ENTRY)
Bilirubin, UA: NEGATIVE
Glucose, UA: NEGATIVE mg/dL
Ketones, POC UA: NEGATIVE mg/dL
Nitrite, UA: NEGATIVE
Protein Ur, POC: NEGATIVE mg/dL
Spec Grav, UA: 1.015 (ref 1.010–1.025)
Urobilinogen, UA: 0.2 E.U./dL
pH, UA: 7 (ref 5.0–8.0)

## 2020-10-05 MED ORDER — CEPHALEXIN 500 MG PO CAPS: 500.0000 mg | ORAL_CAPSULE | Freq: Two times a day (BID) | ORAL | 0 refills | Status: AC

## 2020-10-05 NOTE — ED Provider Notes (Signed)
[name]    CSN: [id number] Arrival date & time: 10/05/20  1727      History   Chief Complaint Chief Complaint  Patient presents with  . Urinary Tract Infection    HPI Holly Meyer is a 24 y.o. female  Presenting for possible UTI.  Endorsing bladder pressure, burning with urination, urgency, and frequency for the last 5 days.  Has tried Azo without relief.  Denies back pain, discharge, fever.  Past Medical History:  Diagnosis Date  . Complication of anesthesia    complaints of nausea and vomitting when wisdom teeth were removed  . FHx: diabetes mellitus   . Septate uterus   . Venous malformation    Right paraspinal    Patient Active Problem List   Diagnosis Date Noted  . Abnormal fetal lie, antepartum 03/25/2018  . History of miscarriage, currently pregnant, first trimester 08/28/2017  . Rh negative state in antepartum period 08/28/2017  . Preventative health care 02/01/2017  . Allergic rhinitis 02/28/2016  . Headache, migraine 08/02/2015  . Back pain 04/28/2015  . Venous malformation 02/28/2012    Past Surgical History:  Procedure Laterality Date  . CESAREAN SECTION N/A 03/25/2018   Procedure: PRIMARY CESAREAN SECTION;  Surgeon: Harlin Heys, MD;  Location: [facility] ORS;  Service: Obstetrics;  Laterality: N/A;  Female born @ 75    . DILATION AND EVACUATION N/A 01/07/2017   Procedure: DILATATION AND EVACUATION;  Surgeon: Harlin Heys, MD;  Location: [facility] ORS;  Service: Gynecology;  Laterality: N/A;  . WISDOM TOOTH EXTRACTION  2012    OB History    Gravida  2   Para  1   Term  1   Preterm  0   AB  1   Living  1     SAB  1   IAB  0   Ectopic  0   Multiple  0   Live Births  1            Home Medications    Prior to Admission medications   Medication Sig Start Date End Date Taking? Authorizing Provider  acetaminophen (TYLENOL) 500 MG tablet Take 500 mg by mouth every 6 (six) hours as needed for moderate  pain or headache.     [provider]  cephALEXin (KEFLEX) 500 MG capsule Take 1 capsule (500 mg total) by mouth 2 (two) times daily for 3 days. 10/05/20 10/08/20  Hall-Potvin, [place], PA-C    Family History Family History  Problem Relation Age of Onset  . Hypertension Father   . Migraines Mother   . Diabetes Paternal Grandmother        family history  . Heart failure Paternal Grandfather   . Diabetes Paternal Grandfather   . Alzheimer's disease Maternal Grandmother     Social History Social History   Tobacco Use  . Smoking status: Former Smoker    Packs/day: 0.25    Types: Cigarettes  . Smokeless tobacco: Never Used  . Tobacco comment: recently quit  Vaping Use  . Vaping Use: Never used  Substance Use Topics  . Alcohol use: Not Currently    Alcohol/week: 0.0 standard drinks    Comment: socially  . Drug use: No     Allergies   Fexofenadine, Imitrex [sumatriptan], Guaifenesin, Guaifenesin er, Meloxicam, and Naproxen   Review of Systems Review of Systems  Constitutional: Negative for fatigue and fever.  Respiratory: Negative for cough and shortness of breath.   Cardiovascular: Negative for chest  pain and palpitations.  Gastrointestinal: Negative for constipation and diarrhea.  Genitourinary: Positive for dysuria, frequency and urgency. Negative for flank pain, hematuria, pelvic pain, vaginal bleeding, vaginal discharge and vaginal pain.     Physical Exam Triage Vital Signs ED Triage Vitals  Enc Vitals Group     BP 10/05/20 1811 122/87     Pulse Rate 10/05/20 1811 83     Resp 10/05/20 1811 20     Temp 10/05/20 1811 97.8 F (36.6 C)     Temp Source 10/05/20 1811 Oral     SpO2 10/05/20 1811 98 %     Weight --      Height --      Head Circumference --      Peak Flow --      Pain Score 10/05/20 1821 0     Pain Loc --      Pain Edu? --      Excl. in [place]? --    No data found.  Updated Vital Signs BP 122/87 (BP Location: Left Arm)   Pulse 83    Temp 97.8 F (36.6 C) (Oral)   Resp 20   LMP 09/14/2020   SpO2 98%   Visual Acuity Right Eye Distance:   Left Eye Distance:   Bilateral Distance:    Right Eye Near:   Left Eye Near:    Bilateral Near:     Physical Exam Constitutional:      General: She is not in acute distress. HENT:     Head: Normocephalic and atraumatic.  Eyes:     General: No scleral icterus.    Pupils: Pupils are equal, round, and reactive to light.  Cardiovascular:     Rate and Rhythm: Normal rate.  Pulmonary:     Effort: Pulmonary effort is normal.  Abdominal:     General: Bowel sounds are normal.     Palpations: Abdomen is soft.     Tenderness: There is no abdominal tenderness. There is no right CVA tenderness, left CVA tenderness or guarding.  Skin:    Coloration: Skin is not jaundiced or pale.  Neurological:     Mental Status: She is alert and oriented to person, place, and time.      UC Treatments / Results  Labs (all labs ordered are listed, but only abnormal results are displayed) Labs Reviewed  POCT URINALYSIS DIP (MANUAL ENTRY) - Abnormal; Notable for the following components:      Result Value   Clarity, UA cloudy (*)    Blood, UA trace-intact (*)    Leukocytes, UA Small (1+) (*)    All other components within normal limits  URINE CULTURE  POCT URINE PREGNANCY    EKG   Radiology No results found.  Procedures Procedures (including critical care time)  Medications Ordered in UC Medications - No data to display  Initial Impression / Assessment and Plan / UC Course  I have reviewed the triage vital signs and the nursing notes.  Pertinent labs & imaging results that were available during my care of the patient were reviewed by me and considered in my medical decision making (see chart for details).     Urine as above, culture pending.  Patient is symptomatic: We will start Keflex.  Return precautions discussed, pt verbalized understanding and is agreeable to  plan. Final Clinical Impressions(s) / UC Diagnoses   Final diagnoses:  Acute cystitis with hematuria     Discharge Instructions     Take antibiotic twice daily  with food. Important to drink plenty of water throughout the day. May continue Azo as needed for burning sensation. Return for worsening urinary symptoms, blood in urine, abdominal or back pain, fever.    ED Prescriptions    Medication Sig Dispense Auth. Provider   cephALEXin (KEFLEX) 500 MG capsule Take 1 capsule (500 mg total) by mouth 2 (two) times daily for 3 days. 6 capsule Hall-Potvin, [place], PA-C     PDMP not reviewed this encounter.   Neldon Mc [place], [place] 10/05/20 [id number]

## 2020-10-05 NOTE — ED Triage Notes (Signed)
Pt c/o bladder pressure, burning on urination, and urgency/frequency x5 days. Taken AZO with a little relief.

## 2020-10-05 NOTE — Discharge Instructions (Signed)
Take antibiotic twice daily with food. Important to drink plenty of water throughout the day. May continue Azo as needed for burning sensation. Return for worsening urinary symptoms, blood in urine, abdominal or back pain, fever. 

## 2020-10-07 LAB — URINE CULTURE: Culture: <10000 — AB

## 2020-10-15 HISTORY — DX: Maternal care for unspecified type scar from previous cesarean delivery: O34.219

## 2020-10-27 DIAGNOSIS — R059 Cough, unspecified: Secondary | ICD-10-CM | POA: Diagnosis not present

## 2021-03-31 DIAGNOSIS — M5412 Radiculopathy, cervical region: Secondary | ICD-10-CM | POA: Diagnosis not present

## 2021-05-30 ENCOUNTER — Ambulatory Visit (INDEPENDENT_AMBULATORY_CARE_PROVIDER_SITE_OTHER): Payer: Medicaid Other | Admitting: Obstetrics and Gynecology

## 2021-05-30 ENCOUNTER — Encounter: Payer: Self-pay | Admitting: Obstetrics and Gynecology

## 2021-05-30 ENCOUNTER — Other Ambulatory Visit: Payer: Self-pay

## 2021-05-30 VITALS — BP 132/84 | HR 96 | Resp 16 | Ht 67.0 in | Wt 169.4 lb

## 2021-05-30 DIAGNOSIS — Z3200 Encounter for pregnancy test, result unknown: Secondary | ICD-10-CM

## 2021-05-30 DIAGNOSIS — O34219 Maternal care for unspecified type scar from previous cesarean delivery: Secondary | ICD-10-CM | POA: Diagnosis not present

## 2021-05-30 DIAGNOSIS — N926 Irregular menstruation, unspecified: Secondary | ICD-10-CM

## 2021-05-30 LAB — POCT URINE PREGNANCY: Preg Test, Ur: POSITIVE — AB

## 2021-05-30 NOTE — Progress Notes (Signed)
HPI:      Holly Meyer is a 25 y.o. G3P1011 who LMP was Patient's last menstrual period was 04/17/2021.  Subjective:   She presents today for pregnancy confirmation.  She was attempting pregnancy and got pregnant in the first month of attempting.  She is taking prenatal vitamins as directed.  She is doing well.  Excited to be pregnant. Previous pregnancy was complicated by breech presentation at term and subsequent cesarean delivery.    Hx: The following portions of the patient's history were reviewed and updated as appropriate:             She  has a past medical history of Complication of anesthesia, FHx: diabetes mellitus, Septate uterus, and Venous malformation. She does not have any pertinent problems on file. She  has a past surgical history that includes Wisdom tooth extraction (2012); Dilation and evacuation (N/A, 01/07/2017); and Cesarean section (N/A, 03/25/2018). Her family history includes Alzheimer's disease in her maternal grandmother; Diabetes in her paternal grandfather and paternal grandmother; Heart failure in her paternal grandfather; Hypertension in her father; Migraines in her mother. She  reports that she has quit smoking. Her smoking use included cigarettes. She smoked an average of .25 packs per day. She has never used smokeless tobacco. She reports that she does not currently use alcohol. She reports that she does not use drugs. She has a current medication list which includes the following prescription(s): acetaminophen. She is allergic to fexofenadine, imitrex [sumatriptan], guaifenesin, guaifenesin er, meloxicam, and naproxen.       Review of Systems:  Review of Systems  Constitutional: Denied constitutional symptoms, night sweats, recent illness, fatigue, fever, insomnia and weight loss.  Eyes: Denied eye symptoms, eye pain, photophobia, vision change and visual disturbance.  Ears/Nose/Throat/Neck: Denied ear, nose, throat or neck symptoms, hearing loss, nasal  discharge, sinus congestion and sore throat.  Cardiovascular: Denied cardiovascular symptoms, arrhythmia, chest pain/pressure, edema, exercise intolerance, orthopnea and palpitations.  Respiratory: Denied pulmonary symptoms, asthma, pleuritic pain, productive sputum, cough, dyspnea and wheezing.  Gastrointestinal: Denied, gastro-esophageal reflux, melena, nausea and vomiting.  Genitourinary: Denied genitourinary symptoms including symptomatic vaginal discharge, pelvic relaxation issues, and urinary complaints.  Musculoskeletal: Denied musculoskeletal symptoms, stiffness, swelling, muscle weakness and myalgia.  Dermatologic: Denied dermatology symptoms, rash and scar.  Neurologic: Denied neurology symptoms, dizziness, headache, neck pain and syncope.  Psychiatric: Denied psychiatric symptoms, anxiety and depression.  Endocrine: Denied endocrine symptoms including hot flashes and night sweats.   Meds:   Current Outpatient Medications on File Prior to Visit  Medication Sig Dispense Refill   acetaminophen (TYLENOL) 500 MG tablet Take 500 mg by mouth every 6 (six) hours as needed for moderate pain or headache.      No current facility-administered medications on file prior to visit.      Objective:     Vitals:   05/30/21 1416  BP: 132/84  Pulse: 96  Resp: 16   Filed Weights   05/30/21 1416  Weight: 169 lb 6.4 oz (76.8 kg)              Urinary beta-hCG positive  Assessment:    G3P1011 Patient Active Problem List   Diagnosis Date Noted   Abnormal fetal lie, antepartum 03/25/2018   History of miscarriage, currently pregnant, first trimester 08/28/2017   Rh negative state in antepartum period 08/28/2017   Preventative health care 02/01/2017   Allergic rhinitis 02/28/2016   Headache, migraine 08/02/2015   Back pain 04/28/2015   Venous malformation 02/28/2012  1. Possible pregnancy, not yet confirmed   2. Missed menses   3. History of cesarean delivery, currently  pregnant     Estimated gestational age approximately 82 weeks based on LMP.   Plan:            Prenatal Plan 1.  The patient was given prenatal literature. 2.  She was continued on prenatal vitamins. 3.  A prenatal lab panel to be drawn at nurse visit. 4.  An ultrasound was ordered to better determine an EDC. 5.  A nurse visit was scheduled. 6.  Genetic testing and testing for other inheritable conditions discussed in detail. She will decide in the future whether to have these labs performed. 7.  A general overview of pregnancy testing, visit schedule, ultrasound schedule, and prenatal care was discussed. 8.  COVID and its risks associated with pregnancy, prevention by limiting exposure and use of masks, as well as the risks and benefits of vaccination during pregnancy were discussed in detail.  [facility] policy regarding office and hospital visitation and testing was explained. 9.  Benefits of breast-feeding discussed in detail including both maternal and infant benefits. Ready Set Baby website discussed 10.  VBAC versus repeat cesarean delivery discussed risk benefits of VBAC discussed.Patient has not yet decided.  Possible future use of VBAC calculator discussed. Orders Orders Placed This Encounter  Procedures   POCT urine pregnancy    No orders of the defined types were placed in this encounter.     F/U  Return in about 6 weeks (around 07/11/2021). I spent 23 minutes involved in the care of this patient preparing to see the patient by obtaining and reviewing her medical history (including labs, imaging tests and prior procedures), documenting clinical information in the electronic health record (EHR), counseling and coordinating care plans, writing and sending prescriptions, ordering tests or procedures and in direct communicating with the patient and medical staff discussing pertinent items from her history and physical exam.  Finis Bud, M.D. 05/30/2021 2:37 PM

## 2021-06-20 ENCOUNTER — Other Ambulatory Visit: Payer: Self-pay | Admitting: Obstetrics and Gynecology

## 2021-06-20 ENCOUNTER — Ambulatory Visit
Admission: RE | Admit: 2021-06-20 | Discharge: 2021-06-20 | Disposition: A | Discharge status: Home / Self Care | Payer: Medicaid Other | Source: Ambulatory Visit | Attending: Obstetrics and Gynecology | Admitting: Obstetrics and Gynecology

## 2021-06-20 ENCOUNTER — Other Ambulatory Visit: Payer: Self-pay

## 2021-06-20 DIAGNOSIS — N926 Irregular menstruation, unspecified: Secondary | ICD-10-CM

## 2021-06-20 DIAGNOSIS — Z3200 Encounter for pregnancy test, result unknown: Secondary | ICD-10-CM

## 2021-06-20 DIAGNOSIS — H5213 Myopia, bilateral: Secondary | ICD-10-CM | POA: Diagnosis not present

## 2021-06-20 DIAGNOSIS — Z3A01 Less than 8 weeks gestation of pregnancy: Secondary | ICD-10-CM | POA: Diagnosis not present

## 2021-06-20 DIAGNOSIS — O26841 Uterine size-date discrepancy, first trimester: Secondary | ICD-10-CM | POA: Diagnosis not present

## 2021-06-29 ENCOUNTER — Telehealth: Payer: Self-pay | Admitting: Obstetrics and Gynecology

## 2021-06-29 DIAGNOSIS — R0981 Nasal congestion: Secondary | ICD-10-CM | POA: Diagnosis not present

## 2021-06-29 NOTE — Telephone Encounter (Signed)
Patient called stating that her daughter tested positive for RSV this morning- cold like symptoms since Monday- she is concerned since she is currently pregnant and requested a call from nurse to see if there was anything she needed to do in this situation. Please Advise.

## 2021-07-03 ENCOUNTER — Ambulatory Visit (INDEPENDENT_AMBULATORY_CARE_PROVIDER_SITE_OTHER): Payer: Medicaid Other | Admitting: Obstetrics and Gynecology

## 2021-07-03 ENCOUNTER — Other Ambulatory Visit: Payer: Self-pay

## 2021-07-03 VITALS — BP 122/83 | HR 98 | Resp 16 | Ht 67.0 in | Wt 164.5 lb

## 2021-07-03 DIAGNOSIS — Z3401 Encounter for supervision of normal first pregnancy, first trimester: Secondary | ICD-10-CM

## 2021-07-03 DIAGNOSIS — Z3A11 11 weeks gestation of pregnancy: Secondary | ICD-10-CM

## 2021-07-03 DIAGNOSIS — Z113 Encounter for screening for infections with a predominantly sexual mode of transmission: Secondary | ICD-10-CM | POA: Diagnosis not present

## 2021-07-03 NOTE — Progress Notes (Signed)
[name] presents for [place] nurse interview visit. Pregnancy confirmation done 05/30/2021.  [id number]. Pregnancy education material explained and given. No cats in the home. NOB labs ordered. HIV labs and Drug screen were explained optional and she did not decline. Drug screen ordered. PNV encouraged. Genetic screening options discussed. Genetic testing: Ordered.  Pt may discuss with provider.  Financial policy reviewed not applicable. FMLA form reviewed and signed. Pt. To follow up with provider in 1 week for NOB physical.  All questions answered.

## 2021-07-03 NOTE — Patient Instructions (Signed)
Holly Meyer Class  March 27, 2017  Wednesday 7:00p - 9:00p  [place], [place]  May 01, 2017  Wednesday [place], [place]    June 05, 2017   Wednesday [place], [place]  July 03, 2017  Wednesday  [place], [place]  July 31, 2017 Wednesday [place], [place]  Interested in a waterbirth?  This informational class will help you discover whether waterbirth is the right fit for you.  Education about waterbirth itself, supplies you would need and how to assemble your support team is what you can expect from this class.  Some obstetrical practices require this class in order to pursue a waterbirth.  (Not all obstetrical practices offer waterbirth check with your healthcare provider)  Register only the expectant mom, but you are encouraged to bring your partner to class!  Fees & Payment No fee  Register Online [email or web address] Search Holly Meyer

## 2021-07-04 LAB — ANTIBODY SCREEN: Antibody Screen: NEGATIVE

## 2021-07-04 LAB — VIRAL HEPATITIS HBV, HCV
HCV Ab: <0.1 s/co ratio (ref 0.0–0.9)
Hep B Core Total Ab: NEGATIVE
Hep B Surface Ab, Qual: NONREACTIVE
Hepatitis B Surface Ag: NEGATIVE

## 2021-07-04 LAB — URINALYSIS, ROUTINE W REFLEX MICROSCOPIC

## 2021-07-04 LAB — PARVOVIRUS B19 ANTIBODY, IGG AND IGM
Parvovirus B19 IgG: 0.2 index (ref 0.0–0.8)
Parvovirus B19 IgM: 0 index (ref 0.0–0.8)

## 2021-07-04 LAB — HCV INTERPRETATION

## 2021-07-04 LAB — RPR: RPR Ser Ql: NONREACTIVE

## 2021-07-04 LAB — ABO AND RH: Rh Factor: NEGATIVE

## 2021-07-04 LAB — HIV ANTIBODY (ROUTINE TESTING W REFLEX): HIV Screen 4th Generation wRfx: NONREACTIVE

## 2021-07-04 LAB — RUBELLA SCREEN: Rubella Antibodies, IGG: 3.31 index (ref >0.99)

## 2021-07-04 LAB — VARICELLA ZOSTER ANTIBODY, IGG: Varicella zoster IgG: <135 index — ABNORMAL LOW (ref >165)

## 2021-07-05 LAB — CULTURE, OB URINE

## 2021-07-05 LAB — URINE CULTURE, OB REFLEX

## 2021-07-06 LAB — GC/CHLAMYDIA PROBE AMP
Chlamydia trachomatis, NAA: NEGATIVE
Neisseria Gonorrhoeae by PCR: NEGATIVE

## 2021-07-07 LAB — PAIN MGT SCRN (14 DRUGS), UR
Amphetamine Scrn, Ur: NEGATIVE ng/mL
BARBITURATE SCREEN URINE: NEGATIVE ng/mL
BENZODIAZEPINE SCREEN, URINE: NEGATIVE ng/mL
Buprenorphine, Urine: NEGATIVE ng/mL
CANNABINOIDS UR QL SCN: NEGATIVE ng/mL
Cocaine (Metab) Scrn, Ur: NEGATIVE ng/mL
Creatinine(Crt), U: 271.4 mg/dL (ref 20.0–300.0)
Fentanyl, Urine: NEGATIVE pg/mL
Meperidine Screen, Urine: NEGATIVE ng/mL
Methadone Screen, Urine: NEGATIVE ng/mL
OXYCODONE+OXYMORPHONE UR QL SCN: NEGATIVE ng/mL
Opiate Scrn, Ur: NEGATIVE ng/mL
Ph of Urine: 5.6 (ref 4.5–8.9)
Phencyclidine Qn, Ur: NEGATIVE ng/mL
Propoxyphene Scrn, Ur: NEGATIVE ng/mL
Tramadol Screen, Urine: NEGATIVE ng/mL

## 2021-07-07 LAB — NICOTINE SCREEN, URINE: Cotinine Ql Scrn, Ur: NEGATIVE ng/mL

## 2021-07-11 ENCOUNTER — Ambulatory Visit (INDEPENDENT_AMBULATORY_CARE_PROVIDER_SITE_OTHER): Payer: Medicaid Other | Admitting: Obstetrics and Gynecology

## 2021-07-11 ENCOUNTER — Other Ambulatory Visit: Payer: Self-pay

## 2021-07-11 ENCOUNTER — Encounter: Payer: Self-pay | Admitting: Obstetrics and Gynecology

## 2021-07-11 VITALS — BP 118/79 | HR 107 | Wt 164.2 lb

## 2021-07-11 DIAGNOSIS — Z3A12 12 weeks gestation of pregnancy: Secondary | ICD-10-CM | POA: Diagnosis not present

## 2021-07-11 DIAGNOSIS — Z3401 Encounter for supervision of normal first pregnancy, first trimester: Secondary | ICD-10-CM

## 2021-07-11 DIAGNOSIS — Z1379 Encounter for other screening for genetic and chromosomal anomalies: Secondary | ICD-10-CM | POA: Diagnosis not present

## 2021-07-11 LAB — POCT URINALYSIS DIPSTICK OB
Bilirubin, UA: NEGATIVE
Clarity, UA: NEGATIVE
Glucose, UA: NEGATIVE
Ketones, UA: NEGATIVE
Leukocytes, UA: NEGATIVE
Nitrite, UA: NEGATIVE
Spec Grav, UA: 1.025 (ref 1.010–1.025)
Urobilinogen, UA: 0.2 E.U./dL
pH, UA: 6 (ref 5.0–8.0)

## 2021-07-11 NOTE — Progress Notes (Signed)
NOB Physical:She is doing well no new concerns.

## 2021-07-11 NOTE — Progress Notes (Signed)
NOB: MaterniT 21 today.  Patient with occasional nausea in the evenings.  aFP next visit.  Physical examination General NAD, Conversant  HEENT Atraumatic; Op clear with mmm.  Normo-cephalic. Pupils reactive. Anicteric sclerae  Thyroid/Neck Smooth without nodularity or enlargement. Normal ROM.  Neck Supple.  Skin No rashes, lesions or ulceration. Normal palpated skin turgor. No nodularity.  Breasts: No masses or discharge.  Symmetric.  No axillary adenopathy.  Lungs: Clear to auscultation.No rales or wheezes. Normal Respiratory effort, no retractions.  Heart: NSR.  No murmurs or rubs appreciated. No periferal edema  Abdomen: Soft.  Non-tender.  No masses.  No HSM. No hernia  Extremities: Moves all appropriately.  Normal ROM for age. No lymphadenopathy.  Neuro: Oriented to PPT.  Normal mood. Normal affect.     Pelvic:   Vulva: Normal appearance.  No lesions.  Vagina: No lesions or abnormalities noted.  Support: Normal pelvic support.  Urethra No masses tenderness or scarring.  Meatus Normal size without lesions or prolapse.  Cervix: Normal appearance.  No lesions.  Anus: Normal exam.  No lesions.  Perineum: Normal exam.  No lesions.        Bimanual   Adnexae: No masses.  Non-tender to palpation.  Uterus: Enlarged. 164 BPM  Non-tender.  Mobile.  AV.  Adnexae: No masses.  Non-tender to palpation.  Cul-de-sac: Negative for abnormality.  Adnexae: No masses.  Non-tender to palpation.         Pelvimetry   Diagonal: Reached.  Spines: Average.  Sacrum: Concave.  Pubic Arch: Normal.

## 2021-07-12 LAB — CBC WITH DIFFERENTIAL/PLATELET
Basophils Absolute: 0 10*3/uL (ref 0.0–0.2)
Basos: 0 %
EOS (ABSOLUTE): 0.1 10*3/uL (ref 0.0–0.4)
Eos: 1 %
Hematocrit: 37.8 % (ref 34.0–46.6)
Hemoglobin: 12.2 g/dL (ref 11.1–15.9)
Immature Grans (Abs): 0 10*3/uL (ref 0.0–0.1)
Immature Granulocytes: 1 %
Lymphocytes Absolute: 2.3 10*3/uL (ref 0.7–3.1)
Lymphs: 29 %
MCH: 28.5 pg (ref 26.6–33.0)
MCHC: 32.3 g/dL (ref 31.5–35.7)
MCV: 88 fL (ref 79–97)
Monocytes Absolute: 0.4 10*3/uL (ref 0.1–0.9)
Monocytes: 5 %
Neutrophils Absolute: 5.1 10*3/uL (ref 1.4–7.0)
Neutrophils: 64 %
Platelets: 301 10*3/uL (ref 150–450)
RBC: 4.28 x10E6/uL (ref 3.77–5.28)
RDW: 13.1 % (ref 11.7–15.4)
WBC: 7.9 10*3/uL (ref 3.4–10.8)

## 2021-07-16 LAB — MATERNIT21  PLUS CORE+ESS+SCA, BLOOD

## 2021-08-14 NOTE — Patient Instructions (Signed)

## 2021-08-15 ENCOUNTER — Ambulatory Visit (INDEPENDENT_AMBULATORY_CARE_PROVIDER_SITE_OTHER): Payer: Medicaid Other | Admitting: Obstetrics and Gynecology

## 2021-08-15 ENCOUNTER — Encounter: Payer: Self-pay | Admitting: Obstetrics and Gynecology

## 2021-08-15 ENCOUNTER — Other Ambulatory Visit: Payer: Self-pay

## 2021-08-15 VITALS — BP 120/78 | HR 106 | Wt 163.7 lb

## 2021-08-15 DIAGNOSIS — Z3A17 17 weeks gestation of pregnancy: Secondary | ICD-10-CM

## 2021-08-15 DIAGNOSIS — Z1379 Encounter for other screening for genetic and chromosomal anomalies: Secondary | ICD-10-CM

## 2021-08-15 DIAGNOSIS — Z6791 Unspecified blood type, Rh negative: Secondary | ICD-10-CM

## 2021-08-15 DIAGNOSIS — Z3482 Encounter for supervision of other normal pregnancy, second trimester: Secondary | ICD-10-CM | POA: Diagnosis not present

## 2021-08-15 DIAGNOSIS — Z23 Encounter for immunization: Secondary | ICD-10-CM

## 2021-08-15 DIAGNOSIS — O34219 Maternal care for unspecified type scar from previous cesarean delivery: Secondary | ICD-10-CM

## 2021-08-15 DIAGNOSIS — O26899 Other specified pregnancy related conditions, unspecified trimester: Secondary | ICD-10-CM

## 2021-08-15 LAB — POCT URINALYSIS DIPSTICK OB
Bilirubin, UA: NEGATIVE
Blood, UA: NEGATIVE
Glucose, UA: NEGATIVE
Ketones, UA: NEGATIVE
Nitrite, UA: NEGATIVE
Spec Grav, UA: 1.02 (ref 1.010–1.025)
Urobilinogen, UA: 0.2 E.U./dL
pH, UA: 7 (ref 5.0–8.0)

## 2021-08-15 NOTE — Progress Notes (Signed)
ROB: Patient notes increase in vaginal discharge, denies itching or burning but desires to have exam done. Exam normal, with increased thin white discharge (no odor).  Flu vaccine given today.  For AFP today, had normal MaterniT21 testing. Discussed TOLAC vs repeat C-section, after counseling, patient thinks she would like to [facility]. Given info. VBAC calculated score is 77%. RTC in 4 weeks, for anatomy scan at that time.

## 2021-08-15 NOTE — Progress Notes (Signed)
OB-Pt present for routine prenatal care. Pt stated fetal movement present; no contractions present; no vaginal bleeding and watery vaginal discharge not sure if it is discharge, urine or something else.  Flu vaccine administered.

## 2021-08-21 LAB — AFP, SERUM, OPEN SPINA BIFIDA
AFP MoM: 0.71
AFP Value: 27.2 ng/mL
Gest. Age on Collection Date: 17.1 weeks
Maternal Age At EDD: 26.8 yr
OSBR Risk 1 IN: 10000
Test Results:: NEGATIVE
Weight: 164 [lb_av]

## 2021-09-02 ENCOUNTER — Emergency Department
Admission: EM | Admit: 2021-09-02 | Discharge: 2021-09-02 | Disposition: A | Discharge status: Home / Self Care | Payer: Medicaid Other | Attending: Emergency Medicine | Admitting: Emergency Medicine

## 2021-09-02 ENCOUNTER — Other Ambulatory Visit: Payer: Self-pay

## 2021-09-02 DIAGNOSIS — R12 Heartburn: Secondary | ICD-10-CM | POA: Insufficient documentation

## 2021-09-02 DIAGNOSIS — O26892 Other specified pregnancy related conditions, second trimester: Secondary | ICD-10-CM | POA: Diagnosis not present

## 2021-09-02 DIAGNOSIS — Z87891 Personal history of nicotine dependence: Secondary | ICD-10-CM | POA: Insufficient documentation

## 2021-09-02 DIAGNOSIS — N9489 Other specified conditions associated with female genital organs and menstrual cycle: Secondary | ICD-10-CM | POA: Diagnosis not present

## 2021-09-02 DIAGNOSIS — Z3A19 19 weeks gestation of pregnancy: Secondary | ICD-10-CM | POA: Diagnosis not present

## 2021-09-02 DIAGNOSIS — R1084 Generalized abdominal pain: Secondary | ICD-10-CM | POA: Diagnosis not present

## 2021-09-02 LAB — LIPASE, BLOOD: Lipase: 22 U/L (ref 11–51)

## 2021-09-02 LAB — URINALYSIS, COMPLETE (UACMP) WITH MICROSCOPIC
Bilirubin Urine: NEGATIVE
Glucose, UA: NEGATIVE mg/dL
Ketones, ur: 5 mg/dL — AB
Leukocytes,Ua: NEGATIVE
Nitrite: NEGATIVE
Protein, ur: NEGATIVE mg/dL
Specific Gravity, Urine: 1.017 (ref 1.005–1.030)
pH: 6 (ref 5.0–8.0)

## 2021-09-02 LAB — CBC
HCT: 36.1 % (ref 36.0–46.0)
Hemoglobin: 12 g/dL (ref 12.0–15.0)
MCH: 29.8 pg (ref 26.0–34.0)
MCHC: 33.2 g/dL (ref 30.0–36.0)
MCV: 89.6 fL (ref 80.0–100.0)
Platelets: 280 10*3/uL (ref 150–400)
RBC: 4.03 MIL/uL (ref 3.87–5.11)
RDW: 13.9 % (ref 11.5–15.5)
WBC: 12.3 10*3/uL — ABNORMAL HIGH (ref 4.0–10.5)
nRBC: 0 % (ref 0.0–0.2)

## 2021-09-02 LAB — HEPATIC FUNCTION PANEL
ALT: 16 U/L (ref 0–44)
AST: 27 U/L (ref 15–41)
Albumin: 3.6 g/dL (ref 3.5–5.0)
Alkaline Phosphatase: 61 U/L (ref 38–126)
Bilirubin, Direct: 0.1 mg/dL (ref 0.0–0.2)
Indirect Bilirubin: 0.3 mg/dL (ref 0.3–0.9)
Total Bilirubin: 0.4 mg/dL (ref 0.3–1.2)
Total Protein: 7.1 g/dL (ref 6.5–8.1)

## 2021-09-02 LAB — BASIC METABOLIC PANEL WITH GFR
Anion gap: 8 (ref 5–15)
BUN: 11 mg/dL (ref 6–20)
CO2: 27 mmol/L (ref 22–32)
Calcium: 9.2 mg/dL (ref 8.9–10.3)
Chloride: 101 mmol/L (ref 98–111)
Creatinine, Ser: 0.59 mg/dL (ref 0.44–1.00)
GFR, Estimated: >60 mL/min
Glucose, Bld: 108 mg/dL — ABNORMAL HIGH (ref 70–99)
Potassium: 3.8 mmol/L (ref 3.5–5.1)
Sodium: 136 mmol/L (ref 135–145)

## 2021-09-02 LAB — HCG, QUANTITATIVE, PREGNANCY: hCG, Beta Chain, Quant, S: 41516 m[IU]/mL — ABNORMAL HIGH

## 2021-09-02 MED ORDER — ACETAMINOPHEN 500 MG PO TABS
1000.0000 mg | ORAL_TABLET | Freq: Once | ORAL | Status: AC
  Administered 2021-09-02: 1000 mg via ORAL
  Filled 2021-09-02: qty 2

## 2021-09-02 MED ORDER — FAMOTIDINE 20 MG PO TABS
20.0000 mg | ORAL_TABLET | Freq: Once | ORAL | Status: AC
  Administered 2021-09-02: 20 mg via ORAL
  Filled 2021-09-02: qty 1

## 2021-09-02 NOTE — ED Provider Notes (Signed)
[facility]  ____________________________________________   Event Date/Time   First MD Initiated Contact with Patient 09/02/21 2151     (approximate)  I have reviewed the triage vital signs and the nursing notes.   HISTORY  Chief Complaint Abdominal Pain    HPI Holly Meyer is a 25 y.o. female G3P1 approximately [redacted] weeks pregnant who presents with abdominal pain.  Pain started acutely around 5:30 PM.  Pain was diffuse in the bilateral abdomen and lower back.  Sharp in nature.  The pain in her abdomen has largely resolved but she continues to have some lower back pain.  Denies associated nausea, vomiting, diarrhea.  She denies fevers, chills, or urinary symptoms.  She did have peanut butter and crackers in the emergency department and had some heartburn afterward but tolerated it.  Patient denies vaginal bleeding, still feels fetal movement.         Past Medical History:  Diagnosis Date   Complication of anesthesia    complaints of nausea and vomitting when wisdom teeth were removed   FHx: diabetes mellitus    Septate uterus    Venous malformation    Right paraspinal    Patient Active Problem List   Diagnosis Date Noted   Patient desires vaginal birth after cesarean section (VBAC) 08/15/2021   Abnormal fetal lie, antepartum 03/25/2018   History of miscarriage, currently pregnant, first trimester 08/28/2017   Rh negative state in antepartum period 08/28/2017   Preventative health care 02/01/2017   Allergic rhinitis 02/28/2016   Headache, migraine 08/02/2015   Back pain 04/28/2015   Venous malformation 02/28/2012    Past Surgical History:  Procedure Laterality Date   CESAREAN SECTION N/A 03/25/2018   Procedure: PRIMARY CESAREAN SECTION;  Surgeon: Harlin Heys, MD;  Location: [facility] ORS;  Service: Obstetrics;  Laterality: N/A;  Female born @ [place] N/A 01/07/2017   Procedure: DILATATION AND EVACUATION;   Surgeon: Harlin Heys, MD;  Location: [facility] ORS;  Service: Gynecology;  Laterality: N/A;   WISDOM TOOTH EXTRACTION  2012    Prior to Admission medications   Medication Sig Start Date End Date Taking? Authorizing Provider  acetaminophen (TYLENOL) 500 MG tablet Take 500 mg by mouth every 6 (six) hours as needed for moderate pain or headache.     [provider]  Prenatal Vit-Fe Fumarate-FA (PRENATAL VITAMINS PO) Take by mouth.    [provider]    Allergies Fexofenadine, Imitrex [sumatriptan], Guaifenesin, Guaifenesin er, Meloxicam, and Naproxen  Family History  Problem Relation Age of Onset   Hypertension Father    Migraines Mother    Diabetes Paternal Grandmother        family history   Heart failure Paternal Grandfather    Diabetes Paternal Grandfather    Alzheimer's disease Maternal Grandmother     Social History Social History   Tobacco Use   Smoking status: Former    Packs/day: 0.25    Types: Cigarettes   Smokeless tobacco: Never   Tobacco comments:    recently quit  Vaping Use   Vaping Use: Never used  Substance Use Topics   Alcohol use: Not Currently    Alcohol/week: 0.0 standard drinks    Comment: socially   Drug use: No    Review of Systems   Review of Systems  Constitutional:  Negative for activity change, chills and fever.  Respiratory:  Negative for shortness of breath.   Gastrointestinal:  Positive for  abdominal pain. Negative for constipation, diarrhea, nausea and vomiting.  Genitourinary:  Negative for dysuria, hematuria and vaginal bleeding.  Musculoskeletal:  Positive for back pain.  All other systems reviewed and are negative.  Physical Exam Updated Vital Signs BP 119/83 (BP Location: Right Arm)   Pulse 74   Temp 98.2 F (36.8 C) (Oral)   Resp 18   Ht 5\' 7"  (1.702 m)   Wt 76.2 kg   LMP 04/17/2021   SpO2 100%   BMI 26.31 kg/m   Physical Exam Vitals and nursing note reviewed.  Constitutional:      General: She  is not in acute distress.    Appearance: Normal appearance.  HENT:     Head: Normocephalic and atraumatic.  Eyes:     General: No scleral icterus.    Conjunctiva/sclera: Conjunctivae normal.  Cardiovascular:     Rate and Rhythm: Normal rate and regular rhythm.  Pulmonary:     Effort: Pulmonary effort is normal. No respiratory distress.     Breath sounds: No stridor.  Abdominal:     General: Abdomen is flat. There is no distension.     Palpations: Abdomen is soft.     Tenderness: There is no abdominal tenderness. There is no right CVA tenderness or left CVA tenderness.     Comments: Mild tenderness to palpation over the bilateral SI joints  Musculoskeletal:        General: No deformity or signs of injury.     Cervical back: Normal range of motion.  Skin:    General: Skin is dry.     Coloration: Skin is not jaundiced or pale.  Neurological:     General: No focal deficit present.     Mental Status: She is alert and oriented to person, place, and time. Mental status is at baseline.  Psychiatric:        Mood and Affect: Mood normal.        Behavior: Behavior normal.     LABS (all labs ordered are listed, but only abnormal results are displayed)  Labs Reviewed  CBC - Abnormal; Notable for the following components:      Result Value   WBC 12.3 (*)    All other components within normal limits  BASIC METABOLIC PANEL - Abnormal; Notable for the following components:   Glucose, Bld 108 (*)    All other components within normal limits  URINALYSIS, COMPLETE (UACMP) WITH MICROSCOPIC - Abnormal; Notable for the following components:   Color, Urine YELLOW (*)    APPearance HAZY (*)    Hgb urine dipstick MODERATE (*)    Ketones, ur 5 (*)    Bacteria, UA RARE (*)    All other components within normal limits  HCG, QUANTITATIVE, PREGNANCY - Abnormal; Notable for the following components:   hCG, Beta Chain, Quant, S 41,516 (*)    All other components within normal limits  HEPATIC  FUNCTION PANEL  LIPASE, BLOOD  LIPASE, BLOOD   ____________________________________________  EKG  N/a ____________________________________________  RADIOLOGY Almeta Monas, personally viewed and evaluated these images (plain radiographs) as part of my medical decision making, as well as reviewing the written report by the radiologist.  ED MD interpretation:  n/a    ____________________________________________   PROCEDURES  Procedure(s) performed (including Critical Care):  Procedures   ____________________________________________   INITIAL IMPRESSION / ASSESSMENT AND PLAN / ED COURSE   25 year old female who is approximately [redacted] weeks pregnant presents with abdominal pain.  Started rather acutely  and was diffuse in nature and involving her back.  Her abdominal pain is largely resolved but continues to have some low back pain.  She has no associated nausea vomiting or diarrhea.  She has no urinary symptoms vaginal bleeding or other infectious symptoms.  She has been tolerating p.o. but with some burning in her chest afterward.  On exam she appears well, her abdomen is benign and nontender.  She does have some pain over the bilateral SI joints but no CVA tenderness.  UA has no evidence of infection, positive for blood but negative for RBCs.  She has a mild leukocytosis of 12 LFTs and lipase within normal limits.  I did perform a bedside ultrasound of the pelvis which shows normal fetal movement and fetal heart rate.  I did also ultrasound the bilateral kidneys which do not show any evidence of hydronephrosis.  Overall I am reassured that her pain is improving and she has no other associated symptoms is tolerating p.o.  She was given Tylenol and Pepcid in the ED.  Advised to follow-up with GYN and return to the ED if symptoms are worsening.     ____________________________________________   FINAL CLINICAL IMPRESSION(S) / ED DIAGNOSES  Final diagnoses:  Generalized  abdominal pain     ED Discharge Orders     None        Note:  This document was prepared using Dragon voice recognition software and may include unintentional dictation errors.    Rada Hay, MD 09/02/21 [phone]

## 2021-09-02 NOTE — ED Provider Notes (Signed)
Emergency Medicine Provider Triage Evaluation Note  Holly Meyer , a 25 y.o. female  G3P1 was evaluated in triage.  Pt complains of upper abdominal pain that radiates to the back started about 45 minutes prior.  She denies any nausea, vomiting, vaginal bleeding or discharge.  She is about [redacted] weeks pregnant at this time with a single gestation.  She last felt fetal movement this morning..  Review of Systems  Positive: Upper abd pain  Negative: NVD  Physical Exam  BP 129/86   Pulse 75   Temp 97.7 F (36.5 C)   Resp 20   Ht 5\' 7"  (1.702 m)   Wt 76.2 kg   LMP 04/17/2021   SpO2 100%   BMI 26.31 kg/m  Gen:   Awake, no distress  NAD Resp:  Normal effort CTA MSK:   Moves extremities without difficulty  Other:  ABD: gravid, soft, nontender  Medical Decision Making  Medically screening exam initiated at 6:11 PM.  Appropriate orders placed.  Holly Meyer was informed that the remainder of the evaluation will be completed by another provider, this initial triage assessment does not replace that evaluation, and the importance of remaining in the ED until their evaluation is complete.  Female patient G3P1 at [redacted] weeks GA presents to the ED with upper abdominal pain with onset about 45 minutes prior to arrival.  She denies any associated nausea, vomiting, or diarrhea.   Holly Needles, PA-C 09/02/21 1833    Holly Mew, MD 09/04/21 [phone]

## 2021-09-02 NOTE — Discharge Instructions (Signed)
Your blood work was all reassuring including your liver function tests and your pancreas function.  Your ultrasound showed fetal movement and fetal heart rate.  You can take Tylenol for pain.  Reasons to return to the emergency department include nausea and vomiting, fevers urinary symptoms or worsening pain.  Please follow-up with your gynecologist as scheduled.

## 2021-09-02 NOTE — ED Triage Notes (Addendum)
Pt to ED for upper abd pain that radiates to back started 45 mins ago. Denies vaginal bleeding. [redacted] weeks pregnant. Denies complications. Last fetal movement felt this am. Denies n/v Pt appears uncomfortable.  Hx miscarriage  Attempting to evaluate FHT

## 2021-09-02 NOTE — ED Notes (Signed)
Provided pt with some crackers and some water no other needs at this time

## 2021-09-05 ENCOUNTER — Telehealth: Payer: Self-pay

## 2021-09-05 NOTE — Telephone Encounter (Signed)
Called patient to check on her after her visit to ED on 09/02/2021. No answer LVM.

## 2021-09-05 NOTE — Telephone Encounter (Signed)
Patient returned called. She is feeling a lot better.

## 2021-09-06 ENCOUNTER — Encounter: Payer: Medicaid Other | Admitting: Obstetrics and Gynecology

## 2021-09-12 ENCOUNTER — Other Ambulatory Visit: Payer: Self-pay

## 2021-09-12 ENCOUNTER — Other Ambulatory Visit: Payer: Medicaid Other

## 2021-09-12 ENCOUNTER — Ambulatory Visit (INDEPENDENT_AMBULATORY_CARE_PROVIDER_SITE_OTHER): Payer: Medicaid Other | Admitting: Obstetrics and Gynecology

## 2021-09-12 VITALS — BP 117/82 | HR 120 | Wt 165.0 lb

## 2021-09-12 DIAGNOSIS — R350 Frequency of micturition: Secondary | ICD-10-CM

## 2021-09-12 DIAGNOSIS — Z3482 Encounter for supervision of other normal pregnancy, second trimester: Secondary | ICD-10-CM

## 2021-09-12 DIAGNOSIS — Z3A21 21 weeks gestation of pregnancy: Secondary | ICD-10-CM

## 2021-09-12 LAB — POCT URINALYSIS DIPSTICK OB
Appearance: ABNORMAL
Bilirubin, UA: NEGATIVE
Glucose, UA: NEGATIVE
Ketones, UA: NEGATIVE
Nitrite, UA: NEGATIVE
Odor: NORMAL
Spec Grav, UA: 1.01 (ref 1.010–1.025)
Urobilinogen, UA: 0.2 E.U./dL
pH, UA: 7.5 (ref 5.0–8.0)

## 2021-09-12 MED ORDER — NITROFURANTOIN MONOHYD MACRO 100 MG PO CAPS
100.0000 mg | ORAL_CAPSULE | Freq: Two times a day (BID) | ORAL | 0 refills | Status: AC
Start: 2021-09-12 — End: 2021-09-19

## 2021-09-12 NOTE — Progress Notes (Signed)
ROB: Patient has frequency urgency and burning with urination.  She is "sure" that she has a UTI.  We will treat with Macrobid and send C&S.  Ultrasound rescheduled till Thursday.  Patient reports daily fetal movement.

## 2021-09-12 NOTE — Addendum Note (Signed)
Addended by: Meryl Dare on: 09/12/2021 10:32 AM   Modules accepted: Orders

## 2021-09-14 ENCOUNTER — Other Ambulatory Visit: Payer: Self-pay

## 2021-09-14 ENCOUNTER — Ambulatory Visit (INDEPENDENT_AMBULATORY_CARE_PROVIDER_SITE_OTHER): Payer: Medicaid Other

## 2021-09-14 DIAGNOSIS — Z3482 Encounter for supervision of other normal pregnancy, second trimester: Secondary | ICD-10-CM | POA: Diagnosis not present

## 2021-09-15 LAB — URINE CULTURE

## 2021-09-24 ENCOUNTER — Observation Stay
Admission: EM | Admit: 2021-09-24 | Discharge: 2021-09-24 | Disposition: A | Discharge status: Home / Self Care | Payer: Medicaid Other | Attending: Obstetrics and Gynecology | Admitting: Obstetrics and Gynecology

## 2021-09-24 ENCOUNTER — Other Ambulatory Visit: Payer: Self-pay

## 2021-09-24 ENCOUNTER — Encounter: Payer: Self-pay | Admitting: Obstetrics and Gynecology

## 2021-09-24 DIAGNOSIS — O26893 Other specified pregnancy related conditions, third trimester: Secondary | ICD-10-CM | POA: Diagnosis not present

## 2021-09-24 DIAGNOSIS — W1839XA Other fall on same level, initial encounter: Secondary | ICD-10-CM | POA: Insufficient documentation

## 2021-09-24 DIAGNOSIS — O9A212 Injury, poisoning and certain other consequences of external causes complicating pregnancy, second trimester: Principal | ICD-10-CM | POA: Insufficient documentation

## 2021-09-24 DIAGNOSIS — Z3A23 23 weeks gestation of pregnancy: Secondary | ICD-10-CM | POA: Insufficient documentation

## 2021-09-24 DIAGNOSIS — M549 Dorsalgia, unspecified: Secondary | ICD-10-CM | POA: Insufficient documentation

## 2021-09-24 DIAGNOSIS — O368320 Maternal care for abnormalities of the fetal heart rate or rhythm, second trimester, not applicable or unspecified: Secondary | ICD-10-CM | POA: Diagnosis not present

## 2021-09-24 DIAGNOSIS — W19XXXA Unspecified fall, initial encounter: Secondary | ICD-10-CM | POA: Diagnosis present

## 2021-09-24 NOTE — OB Triage Note (Signed)
Pt presents following a fall this afternoon around 1:30. Pt reports she and her husband were cutting down some trees and she was dragging a tree and lost her balance and fell on her back. Pt was advised by the nurse line to come in and be evaluated. Pt did not hit her stomach. Pt denies any LOF or blood loss. Pt denies any pain at this time. Pt reports positive fetal movement. VSS. FHT of 150. Ctx monitor applied at this time. Will continue to monitor.

## 2021-09-24 NOTE — Discharge Summary (Signed)
RN Reviewed discharge instructions with patient. Gave pt opportunity for questions. All questions answered at this time. Pt verbalized understanding. Pt discharged home with her husband.

## 2021-09-25 NOTE — Discharge Summary (Signed)
    L&D OB Triage Note  SUBJECTIVE Holly Meyer is a 25 y.o. G30P1011 female at [redacted]w[redacted]d, EDD Estimated Date of Delivery: 01/22/22 who presented to triage following a fall this afternoon around 1:30. She lost her balance and fell on her back. Pt was advised by the nurse line to come in and be evaluated. Pt did not hit her stomach. Pt denies any LOF or blood loss. Pt denies any pain at this time. Pt reports fetal movement..   OB History  Gravida Para Term Preterm AB Living  3 1 1  0 1 1  SAB IAB Ectopic Multiple Live Births  1 0 0 0 1    # Outcome Date GA Lbr Len/2nd Weight Sex Delivery Anes PTL Lv  3 Current           2 Term 03/25/18 [redacted]w[redacted]d  3030 g F CS-LTranv Spinal  LIV     Name: BUENA, BOEHM Kimika     Apgar1: 8  Apgar5: 8  1 SAB 11/2016 [redacted]w[redacted]d           No medications prior to admission.     OBJECTIVE  Nursing Evaluation:   BP 116/71 (BP Location: Left Arm)   Pulse 80   Temp 98.4 F (36.9 C) (Oral)   Resp 16   Ht 5\' 7"  (1.702 m)   Wt 76.2 kg   LMP 04/17/2021   SpO2 100%   BMI 26.31 kg/m    Findings:        No signs of abruption.      NST was performed and has been reviewed by me.  NST INTERPRETATION: Fetal heart tones appropriate for gestational age.  Mode: External Baseline Rate (A): 155 bpm (fht)           Contraction Frequency (min): rare  ASSESSMENT Impression:  1.  Pregnancy:  G3P1011 at [redacted]w[redacted]d , EDD Estimated Date of Delivery: 01/22/22 2.  Reassuring fetal and maternal status   PLAN 1. Current condition and above findings reviewed.  Reassuring fetal and maternal condition. 2. Discharge home with standard labor precautions given to return to L&D or call the office for problems. 3. Continue routine prenatal care.

## 2021-09-26 ENCOUNTER — Other Ambulatory Visit: Payer: Self-pay

## 2021-09-26 ENCOUNTER — Other Ambulatory Visit: Payer: Self-pay | Admitting: Obstetrics and Gynecology

## 2021-09-26 ENCOUNTER — Ambulatory Visit (INDEPENDENT_AMBULATORY_CARE_PROVIDER_SITE_OTHER): Payer: Medicaid Other

## 2021-09-26 DIAGNOSIS — Z362 Encounter for other antenatal screening follow-up: Secondary | ICD-10-CM | POA: Diagnosis not present

## 2021-10-06 ENCOUNTER — Other Ambulatory Visit: Payer: Self-pay

## 2021-10-06 ENCOUNTER — Ambulatory Visit
Admission: EM | Admit: 2021-10-06 | Discharge: 2021-10-06 | Disposition: A | Discharge status: Home / Self Care | Payer: Medicaid Other | Attending: Physician Assistant | Admitting: Physician Assistant

## 2021-10-06 DIAGNOSIS — J069 Acute upper respiratory infection, unspecified: Secondary | ICD-10-CM

## 2021-10-06 DIAGNOSIS — Z3A24 24 weeks gestation of pregnancy: Secondary | ICD-10-CM | POA: Diagnosis not present

## 2021-10-06 MED ORDER — OSELTAMIVIR PHOSPHATE 75 MG PO CAPS: 75.0000 mg | ORAL_CAPSULE | Freq: Two times a day (BID) | ORAL | 0 refills | Status: DC

## 2021-10-06 NOTE — ED Triage Notes (Signed)
Patient presents to Urgent Care with complaints of cough, chest tightness, and headache since last night. Not taking any meds.

## 2021-10-06 NOTE — ED Provider Notes (Signed)
[name]    CSN: [id number] Arrival date & time: 10/06/21  0836      History   Chief Complaint Chief Complaint  Patient presents with   Cough   Headache    Chest tightness     HPI Holly Meyer is a 25 y.o. female.   Patient here today for evaluation of cough, chest tightness with cough and headache that started last night.  She reports she is also had some body aches.  She has not had any known fever.  She has not tried any medication for symptoms.  She is currently [redacted] weeks pregnant.  The history is provided by the patient.   Past Medical History:  Diagnosis Date   Complication of anesthesia    complaints of nausea and vomitting when wisdom teeth were removed   FHx: diabetes mellitus    Septate uterus    Venous malformation    Right paraspinal    Patient Active Problem List   Diagnosis Date Noted   Fall 09/24/2021   Patient desires vaginal birth after cesarean section (VBAC) 08/15/2021   Abnormal fetal lie, antepartum 03/25/2018   History of miscarriage, currently pregnant, first trimester 08/28/2017   Rh negative state in antepartum period 08/28/2017   Preventative health care 02/01/2017   Allergic rhinitis 02/28/2016   Headache, migraine 08/02/2015   Back pain 04/28/2015   Venous malformation 02/28/2012    Past Surgical History:  Procedure Laterality Date   CESAREAN SECTION N/A 03/25/2018   Procedure: PRIMARY CESAREAN SECTION;  Surgeon: Harlin Heys, MD;  Location: [facility] ORS;  Service: Obstetrics;  Laterality: N/A;  Female born @ [place] N/A 01/07/2018   Procedure: DILATATION AND EVACUATION;  Surgeon: Harlin Heys, MD;  Location: [facility] ORS;  Service: Gynecology;  Laterality: N/A;   WISDOM TOOTH EXTRACTION  2012    OB History     Gravida  3   Para  1   Term  1   Preterm  0   AB  1   Living  1      SAB  1   IAB  0   Ectopic  0   Multiple  0   Live Births  1            Home  Medications    Prior to Admission medications   Medication Sig Start Date End Date Taking? Authorizing Provider  oseltamivir (TAMIFLU) 75 MG capsule Take 1 capsule (75 mg total) by mouth every 12 (twelve) hours. 10/06/21  Yes Francene Finders, PA-C  acetaminophen (TYLENOL) 500 MG tablet Take 500 mg by mouth every 6 (six) hours as needed for moderate pain or headache.     [provider]  Prenatal Vit-Fe Fumarate-FA (PRENATAL VITAMINS PO) Take by mouth.    [provider]    Family History Family History  Problem Relation Age of Onset   Hypertension Father    Migraines Mother    Diabetes Paternal Grandmother        family history   Heart failure Paternal Grandfather    Diabetes Paternal Grandfather    Alzheimer's disease Maternal Grandmother     Social History Social History   Tobacco Use   Smoking status: Former    Packs/day: 0.25    Types: Cigarettes   Smokeless tobacco: Never   Tobacco comments:    recently quit  Vaping Use   Vaping Use: Never used  Substance Use Topics  Alcohol use: Not Currently    Alcohol/week: 0.0 standard drinks    Comment: socially   Drug use: No     Allergies   Fexofenadine, Imitrex [sumatriptan], Guaifenesin, Guaifenesin er, Meloxicam, and Naproxen   Review of Systems Review of Systems  Constitutional:  Negative for chills and fever.  HENT:  Positive for congestion and sore throat. Negative for ear pain.   Eyes:  Negative for discharge and redness.  Respiratory:  Positive for cough. Negative for shortness of breath and wheezing.   Gastrointestinal:  Negative for abdominal pain, diarrhea, nausea and vomiting.  Musculoskeletal:  Positive for myalgias.    Physical Exam Triage Vital Signs ED Triage Vitals  Enc Vitals Group     BP      Pulse      Resp      Temp      Temp src      SpO2      Weight      Height      Head Circumference      Peak Flow      Pain Score      Pain Loc      Pain Edu?      Excl. in  [place]?    No data found.  Updated Vital Signs BP 106/70 (BP Location: Left Arm)    Pulse (!) 114    Temp 98.3 F (36.8 C) (Oral)    Resp 16    LMP 04/17/2021    SpO2 99%     Physical Exam Vitals and nursing note reviewed.  Constitutional:      General: She is not in acute distress.    Appearance: Normal appearance. She is not ill-appearing.  HENT:     Head: Normocephalic and atraumatic.     Nose: Congestion present.     Mouth/Throat:     Mouth: Mucous membranes are moist.     Pharynx: No oropharyngeal exudate or posterior oropharyngeal erythema.  Eyes:     Conjunctiva/sclera: Conjunctivae normal.  Cardiovascular:     Rate and Rhythm: Regular rhythm. Tachycardia present.     Heart sounds: Normal heart sounds. No murmur heard. Pulmonary:     Effort: Pulmonary effort is normal. No respiratory distress.     Breath sounds: Normal breath sounds. No wheezing, rhonchi or rales.  Skin:    General: Skin is warm and dry.  Neurological:     Mental Status: She is alert.  Psychiatric:        Mood and Affect: Mood normal.        Thought Content: Thought content normal.     UC Treatments / Results  Labs (all labs ordered are listed, but only abnormal results are displayed) Labs Reviewed  COVID-19, FLU A+B NAA    EKG   Radiology No results found.  Procedures Procedures (including critical care time)  Medications Ordered in UC Medications - No data to display  Initial Impression / Assessment and Plan / UC Course  I have reviewed the triage vital signs and the nursing notes.  Pertinent labs & imaging results that were available during my care of the patient were reviewed by me and considered in my medical decision making (see chart for details).    Will screen for COVID and flu.  Tamiflu prescribed given symptoms are consistent with flu.  Recommend Tylenol and other symptomatic treatment appropriate for pregnancy if needed in the meantime.  Recommended increased fluids.  Encouraged follow-up with any further concerns.  Final  Clinical Impressions(s) / UC Diagnoses   Final diagnoses:  Acute upper respiratory infection  Pregnancy with 24 completed weeks gestation   Discharge Instructions   None    ED Prescriptions     Medication Sig Dispense Auth. Provider   oseltamivir (TAMIFLU) 75 MG capsule Take 1 capsule (75 mg total) by mouth every 12 (twelve) hours. 10 capsule Francene Finders, PA-C      PDMP not reviewed this encounter.   Francene Finders, PA-C 10/06/21 [phone]

## 2021-10-07 LAB — COVID-19, FLU A+B NAA
Influenza A, NAA: NOT DETECTED
Influenza B, NAA: NOT DETECTED
SARS-CoV-2, NAA: NOT DETECTED

## 2021-10-10 ENCOUNTER — Ambulatory Visit (INDEPENDENT_AMBULATORY_CARE_PROVIDER_SITE_OTHER): Payer: Medicaid Other | Admitting: Obstetrics and Gynecology

## 2021-10-10 ENCOUNTER — Encounter: Payer: Self-pay | Admitting: Obstetrics and Gynecology

## 2021-10-10 ENCOUNTER — Other Ambulatory Visit: Payer: Self-pay

## 2021-10-10 VITALS — BP 112/70 | HR 91 | Wt 168.5 lb

## 2021-10-10 DIAGNOSIS — Z3A25 25 weeks gestation of pregnancy: Secondary | ICD-10-CM

## 2021-10-10 DIAGNOSIS — Z98891 History of uterine scar from previous surgery: Secondary | ICD-10-CM

## 2021-10-10 DIAGNOSIS — Z3482 Encounter for supervision of other normal pregnancy, second trimester: Secondary | ICD-10-CM

## 2021-10-10 LAB — POCT URINALYSIS DIPSTICK OB
Bilirubin, UA: NEGATIVE
Blood, UA: NEGATIVE
Glucose, UA: NEGATIVE
Ketones, UA: NEGATIVE
Nitrite, UA: NEGATIVE
POC,PROTEIN,UA: NEGATIVE
Spec Grav, UA: 1.025 (ref 1.010–1.025)
Urobilinogen, UA: 0.2 E.U./dL
pH, UA: 6 (ref 5.0–8.0)

## 2021-10-10 NOTE — Progress Notes (Signed)
ROB. Patient states she is feeling fetal movement with no pain or pressure. Patient states she was sick last week with an upper respiratory infection and still has a lingering cough. Patient states no questions or concerns at this time.

## 2021-10-10 NOTE — Progress Notes (Signed)
ROB: Patient doing well, no complaints.  Has questions regarding if baby is breech again. Discussed different management approaches in third trimester if needed. Plans to breastfeed (notes issues with milk supply last time). RTC in 4 weeks, for 28 week labs at that time.

## 2021-11-07 ENCOUNTER — Ambulatory Visit (INDEPENDENT_AMBULATORY_CARE_PROVIDER_SITE_OTHER): Payer: Medicaid Other | Admitting: Obstetrics and Gynecology

## 2021-11-07 ENCOUNTER — Encounter: Payer: Self-pay | Admitting: Obstetrics and Gynecology

## 2021-11-07 ENCOUNTER — Other Ambulatory Visit: Payer: Medicaid Other

## 2021-11-07 ENCOUNTER — Other Ambulatory Visit: Payer: Self-pay

## 2021-11-07 VITALS — BP 129/83 | HR 105 | Wt 171.0 lb

## 2021-11-07 DIAGNOSIS — Z6791 Unspecified blood type, Rh negative: Secondary | ICD-10-CM | POA: Diagnosis not present

## 2021-11-07 DIAGNOSIS — O26899 Other specified pregnancy related conditions, unspecified trimester: Secondary | ICD-10-CM | POA: Diagnosis not present

## 2021-11-07 DIAGNOSIS — Z3A29 29 weeks gestation of pregnancy: Secondary | ICD-10-CM

## 2021-11-07 DIAGNOSIS — O26893 Other specified pregnancy related conditions, third trimester: Secondary | ICD-10-CM | POA: Diagnosis not present

## 2021-11-07 DIAGNOSIS — Z23 Encounter for immunization: Secondary | ICD-10-CM

## 2021-11-07 DIAGNOSIS — Z3482 Encounter for supervision of other normal pregnancy, second trimester: Secondary | ICD-10-CM

## 2021-11-07 DIAGNOSIS — Z3483 Encounter for supervision of other normal pregnancy, third trimester: Secondary | ICD-10-CM

## 2021-11-07 LAB — POCT URINALYSIS DIPSTICK OB
Appearance: NORMAL
Bilirubin, UA: NEGATIVE
Blood, UA: NEGATIVE
Glucose, UA: NEGATIVE
Ketones, UA: NEGATIVE
Leukocytes, UA: NEGATIVE
Nitrite, UA: NEGATIVE
Odor: NORMAL
POC,PROTEIN,UA: NEGATIVE
Spec Grav, UA: 1.01 (ref 1.010–1.025)
Urobilinogen, UA: 0.2 E.U./dL
pH, UA: 6 (ref 5.0–8.0)

## 2021-11-07 MED ORDER — RHO D IMMUNE GLOBULIN 1500 UNIT/2ML IJ SOSY
300.0000 ug | PREFILLED_SYRINGE | Freq: Once | INTRAMUSCULAR | Status: AC
  Administered 2021-11-07: 11:00:00 300 ug via INTRAMUSCULAR

## 2021-11-07 NOTE — Progress Notes (Signed)
ROB: Doing well.  States heartburn has improved.  1 hour GCT today.

## 2021-11-08 ENCOUNTER — Encounter: Payer: Self-pay | Admitting: Obstetrics and Gynecology

## 2021-11-08 LAB — CBC
Hematocrit: 32.3 % — ABNORMAL LOW (ref 34.0–46.6)
Hemoglobin: 10.9 g/dL — ABNORMAL LOW (ref 11.1–15.9)
MCH: 30.2 pg (ref 26.6–33.0)
MCHC: 33.7 g/dL (ref 31.5–35.7)
MCV: 90 fL (ref 79–97)
Platelets: 266 10*3/uL (ref 150–450)
RBC: 3.61 x10E6/uL — ABNORMAL LOW (ref 3.77–5.28)
RDW: 13.8 % (ref 11.7–15.4)
WBC: 8 10*3/uL (ref 3.4–10.8)

## 2021-11-08 LAB — RPR: RPR Ser Ql: NONREACTIVE

## 2021-11-08 LAB — GLUCOSE, 1 HOUR GESTATIONAL: Gestational Diabetes Screen: 93 mg/dL (ref 70–139)

## 2021-11-28 ENCOUNTER — Ambulatory Visit (INDEPENDENT_AMBULATORY_CARE_PROVIDER_SITE_OTHER): Payer: Medicaid Other | Admitting: Obstetrics and Gynecology

## 2021-11-28 ENCOUNTER — Encounter: Payer: Self-pay | Admitting: Obstetrics and Gynecology

## 2021-11-28 ENCOUNTER — Other Ambulatory Visit: Payer: Self-pay

## 2021-11-28 VITALS — BP 125/80 | HR 113 | Wt 175.7 lb

## 2021-11-28 DIAGNOSIS — Z87440 Personal history of urinary (tract) infections: Secondary | ICD-10-CM | POA: Diagnosis not present

## 2021-11-28 DIAGNOSIS — Z3483 Encounter for supervision of other normal pregnancy, third trimester: Secondary | ICD-10-CM

## 2021-11-28 DIAGNOSIS — Z3A32 32 weeks gestation of pregnancy: Secondary | ICD-10-CM

## 2021-11-28 LAB — POCT URINALYSIS DIPSTICK OB
Bilirubin, UA: NEGATIVE
Blood, UA: NEGATIVE
Glucose, UA: NEGATIVE
Ketones, UA: NEGATIVE
Nitrite, UA: NEGATIVE
POC,PROTEIN,UA: NEGATIVE
Spec Grav, UA: 1.01 (ref 1.010–1.025)
Urobilinogen, UA: 0.2 E.U./dL
pH, UA: 7 (ref 5.0–8.0)

## 2021-11-28 NOTE — Progress Notes (Signed)
ROB. Patient states feeling cramps in pelvic area off and on through out the night and this morning. Patient states they feel different from when she experienced braxton hicks, these cramps do not have the tightening aspect.   Patient states no other questions or concerns.

## 2021-11-28 NOTE — Progress Notes (Signed)
ROB: Has occasional pelvic pressure/contractions.  She says they do not exactly feel like [place].  Urine sent for C&S.  Again discussed repeat cesarean versus TOLAC.  Patient would like to try TOLAC.

## 2021-12-01 LAB — URINE CULTURE

## 2021-12-04 ENCOUNTER — Encounter: Payer: Self-pay | Admitting: Obstetrics and Gynecology

## 2021-12-06 ENCOUNTER — Telehealth: Payer: Self-pay

## 2021-12-06 NOTE — Telephone Encounter (Signed)
Patient called she has been having some cramping on and off this morning. Feels like mild menstrual cramping. She has been having some watery stools x 2 days and consistent throughout the day. Advised patient that Cramping is normal during pregnancy as her uterus grows and stretches. I recommended that she drink a large glass of water and lay on her side. Also if cramps do not improve or cramps become severe, or if you start having bleeding and cramping or leakage of watery fluid you should be seen by your provider.  Please advise about the watery stools or anything else.

## 2021-12-13 ENCOUNTER — Other Ambulatory Visit: Payer: Self-pay

## 2021-12-13 ENCOUNTER — Encounter: Payer: Self-pay | Admitting: Obstetrics and Gynecology

## 2021-12-13 ENCOUNTER — Ambulatory Visit (INDEPENDENT_AMBULATORY_CARE_PROVIDER_SITE_OTHER): Payer: Medicaid Other | Admitting: Obstetrics and Gynecology

## 2021-12-13 VITALS — BP 122/66 | HR 104 | Wt 175.1 lb

## 2021-12-13 DIAGNOSIS — O34219 Maternal care for unspecified type scar from previous cesarean delivery: Secondary | ICD-10-CM

## 2021-12-13 DIAGNOSIS — Z3483 Encounter for supervision of other normal pregnancy, third trimester: Secondary | ICD-10-CM

## 2021-12-13 DIAGNOSIS — Z3A34 34 weeks gestation of pregnancy: Secondary | ICD-10-CM

## 2021-12-13 LAB — POCT URINALYSIS DIPSTICK OB
Bilirubin, UA: NEGATIVE
Glucose, UA: NEGATIVE
Ketones, UA: NEGATIVE
Nitrite, UA: NEGATIVE
POC,PROTEIN,UA: NEGATIVE
Spec Grav, UA: 1.01 (ref 1.010–1.025)
Urobilinogen, UA: 0.2 E.U./dL
pH, UA: 7 (ref 5.0–8.0)

## 2021-12-13 NOTE — Progress Notes (Signed)
ROB: Notes some cramping, no associated bleeding. Also noting [employer]. Reviewed TOLAC again. Risks/benefits discussed in detail. All questions answered.  Patient elects for TOLAC, consent signed 12/13/2021. ? ? ?

## 2021-12-13 NOTE — Progress Notes (Signed)
ROB: She has been having some cramping. No other concerns today. ?

## 2021-12-18 ENCOUNTER — Encounter: Payer: Self-pay | Admitting: Obstetrics and Gynecology

## 2021-12-26 ENCOUNTER — Encounter: Payer: Self-pay | Admitting: Obstetrics and Gynecology

## 2021-12-26 ENCOUNTER — Ambulatory Visit (INDEPENDENT_AMBULATORY_CARE_PROVIDER_SITE_OTHER): Payer: Medicaid Other | Admitting: Obstetrics and Gynecology

## 2021-12-26 ENCOUNTER — Other Ambulatory Visit: Payer: Self-pay

## 2021-12-26 VITALS — BP 111/79 | HR 97 | Wt 177.6 lb

## 2021-12-26 DIAGNOSIS — Z3483 Encounter for supervision of other normal pregnancy, third trimester: Secondary | ICD-10-CM | POA: Diagnosis not present

## 2021-12-26 DIAGNOSIS — Z113 Encounter for screening for infections with a predominantly sexual mode of transmission: Secondary | ICD-10-CM | POA: Diagnosis not present

## 2021-12-26 DIAGNOSIS — Z3685 Encounter for antenatal screening for Streptococcus B: Secondary | ICD-10-CM

## 2021-12-26 DIAGNOSIS — Z3A36 36 weeks gestation of pregnancy: Secondary | ICD-10-CM

## 2021-12-26 LAB — POCT URINALYSIS DIPSTICK OB
Bilirubin, UA: NEGATIVE
Blood, UA: NEGATIVE
Glucose, UA: NEGATIVE
Ketones, UA: NEGATIVE
Nitrite, UA: NEGATIVE
POC,PROTEIN,UA: NEGATIVE
Spec Grav, UA: 1.015 (ref 1.010–1.025)
Urobilinogen, UA: 0.2 E.U./dL
pH, UA: 7 (ref 5.0–8.0)

## 2021-12-26 NOTE — Progress Notes (Signed)
Holly Meyer. Patient states fetal movement with pressure. She states wanting to discuss having a c-section again today.  Patient states no questions or concerns at this time.  ? ?

## 2021-12-26 NOTE — Progress Notes (Signed)
ROB: She has no complaints.  She and her husband have been talking and have come to the conclusion that they would like a repeat cesarean delivery.  We discussed the risk and benefits and I believe she has a good understanding of these.  She states that she is comfortable with her decision and has no further questions.  She desires a cesarean.  Scheduled for April 3.  GBS-CT/GC performed today. ?

## 2021-12-28 LAB — STREP GP B NAA: Strep Gp B NAA: NEGATIVE

## 2021-12-28 LAB — GC/CHLAMYDIA PROBE AMP
Chlamydia trachomatis, NAA: NEGATIVE
Neisseria Gonorrhoeae by PCR: NEGATIVE

## 2022-01-02 ENCOUNTER — Encounter: Payer: Self-pay | Admitting: Obstetrics and Gynecology

## 2022-01-02 ENCOUNTER — Ambulatory Visit (INDEPENDENT_AMBULATORY_CARE_PROVIDER_SITE_OTHER): Payer: Medicaid Other | Admitting: Obstetrics and Gynecology

## 2022-01-02 ENCOUNTER — Other Ambulatory Visit: Payer: Self-pay

## 2022-01-02 VITALS — BP 126/78 | HR 98 | Wt 176.8 lb

## 2022-01-02 DIAGNOSIS — Z3483 Encounter for supervision of other normal pregnancy, third trimester: Secondary | ICD-10-CM

## 2022-01-02 DIAGNOSIS — Z01812 Encounter for preprocedural laboratory examination: Secondary | ICD-10-CM

## 2022-01-02 DIAGNOSIS — Z3A37 37 weeks gestation of pregnancy: Secondary | ICD-10-CM

## 2022-01-02 LAB — POCT URINALYSIS DIPSTICK OB
Bilirubin, UA: NEGATIVE
Glucose, UA: NEGATIVE
Ketones, UA: NEGATIVE
Nitrite, UA: NEGATIVE
Spec Grav, UA: 1.01 (ref 1.010–1.025)
Urobilinogen, UA: 0.2 E.U./dL
pH, UA: 7 (ref 5.0–8.0)

## 2022-01-02 NOTE — Progress Notes (Signed)
ROB: Patient doing well, no complaints.  Discussed upcoming C-section.  Has preadmit appointment soon.  RTC in 1 week for final visit.  ?

## 2022-01-02 NOTE — Progress Notes (Signed)
ROB: She is doing well, no new concerns today. ?

## 2022-01-08 IMAGING — US US OB COMP LESS 14 WK
1 series · 14 of 28 positions shown · non-contrast
Comparison: None.

CLINICAL DATA: Dating, viability

EXAM:
OBSTETRIC <14 WK ULTRASOUND
TECHNIQUE: Transabdominal ultrasound was performed for evaluation of the
gestation as well as the maternal uterus and adnexal regions.

[Series 1: us ob less than 14 weeks with ob transvaginal · 14 of 28 slices shown]
[im 2/28]
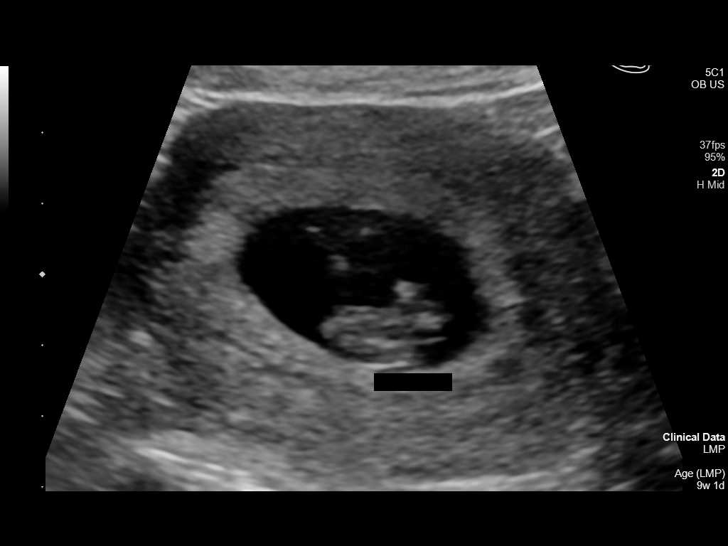
[im 4/28]
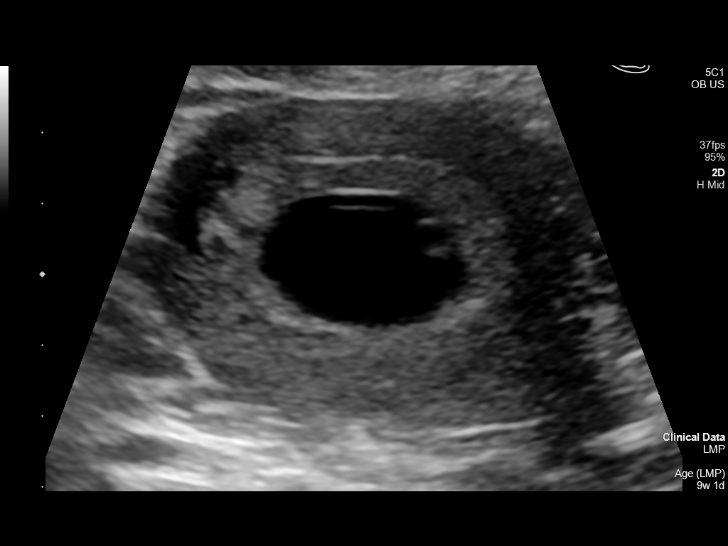
[im 6/28]
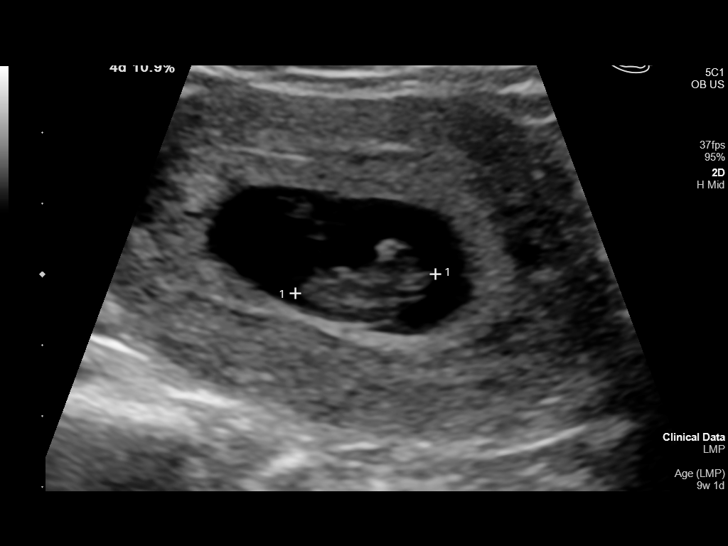
[im 8/28]
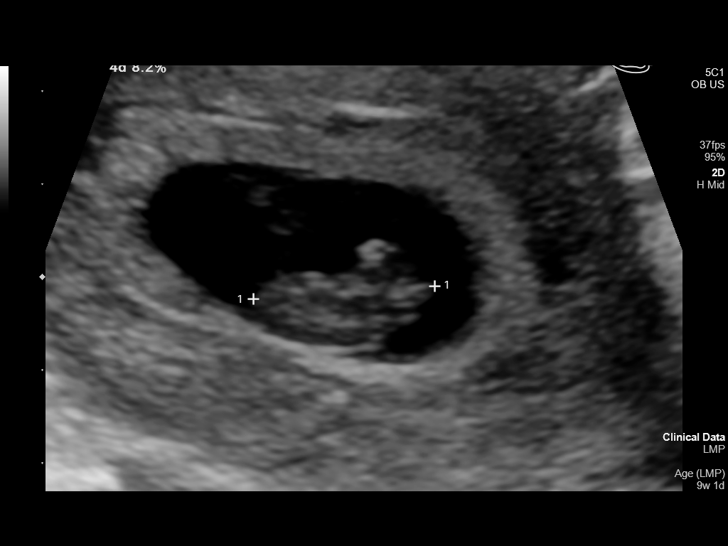
[im 10/28]
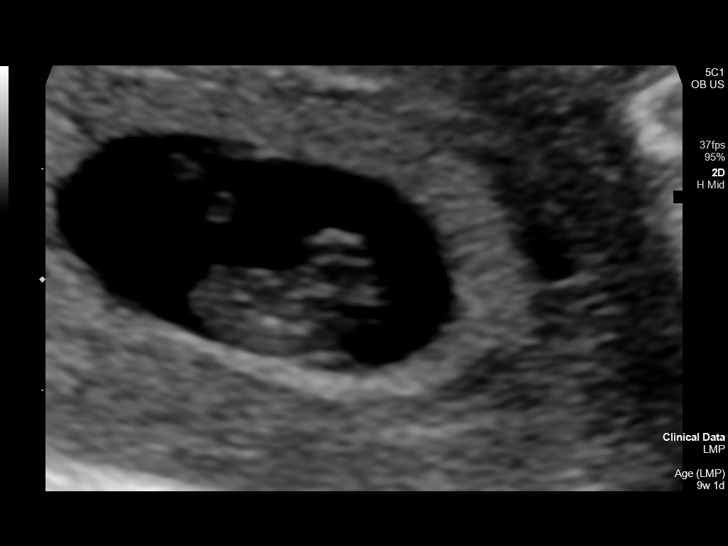
[im 12/28]
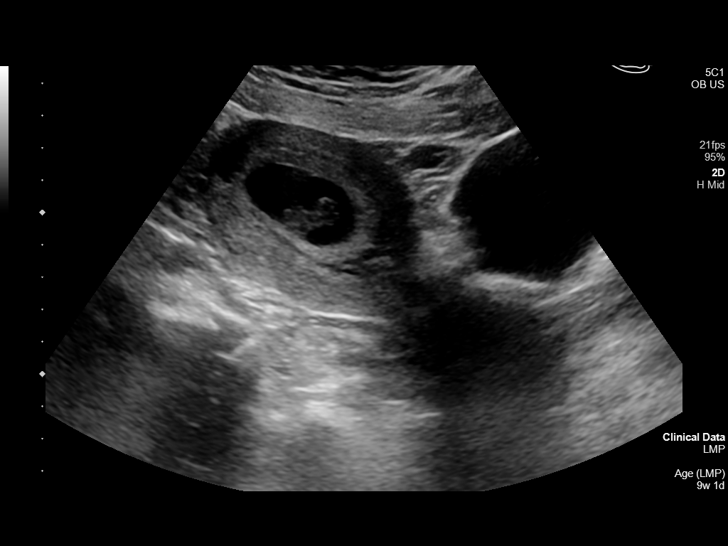
[im 14/28]
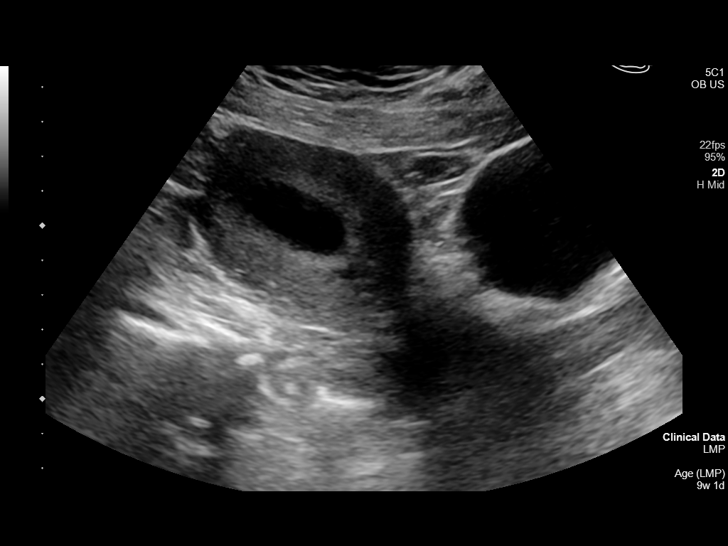
[im 16/28]
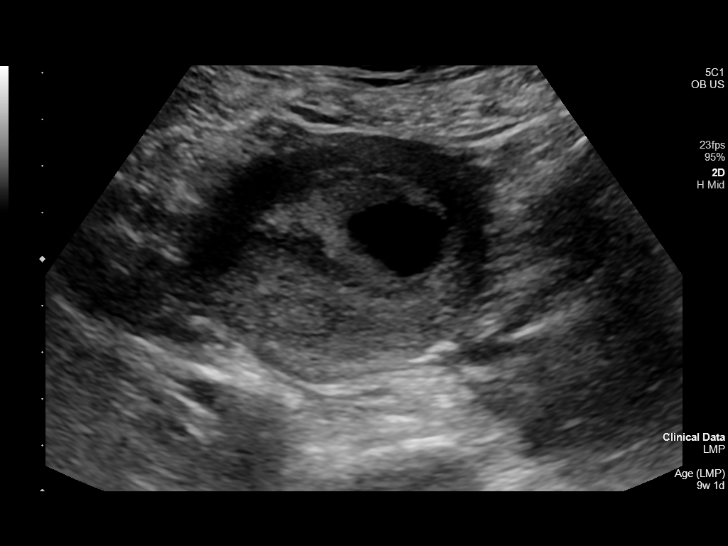
[im 18/28]
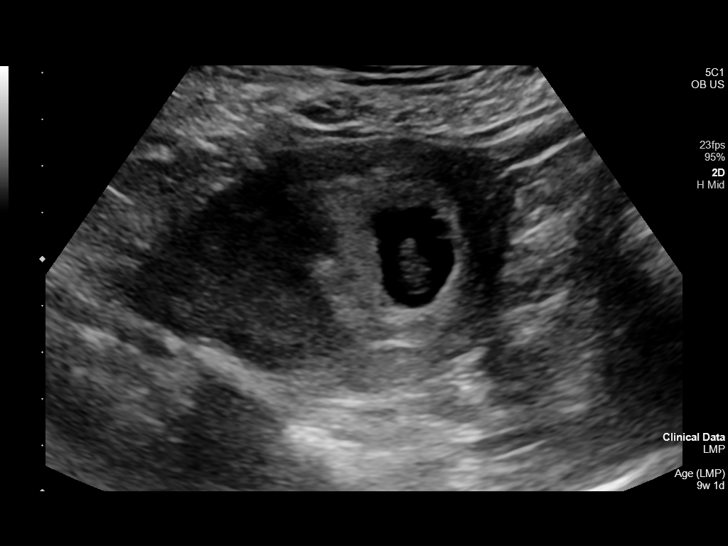
[im 20/28]
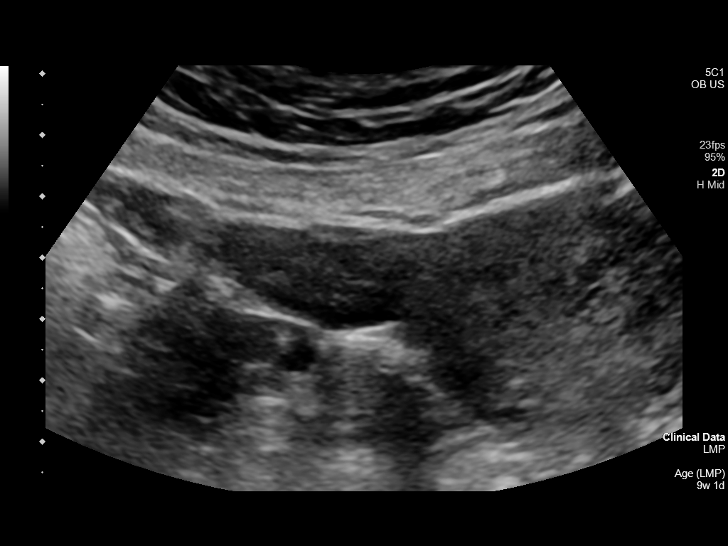
[im 22/28]
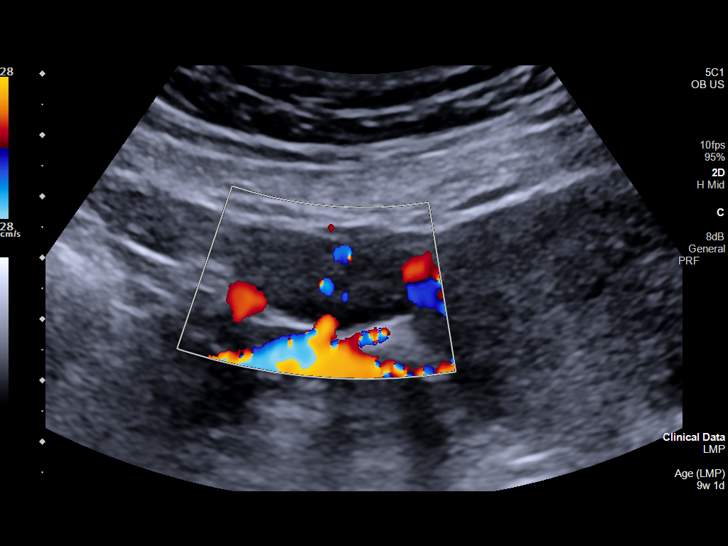
[im 24/28]
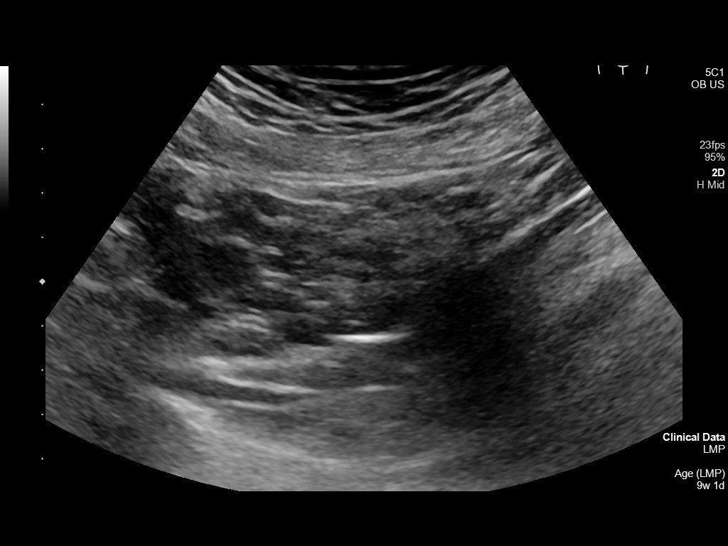
[im 26/28]
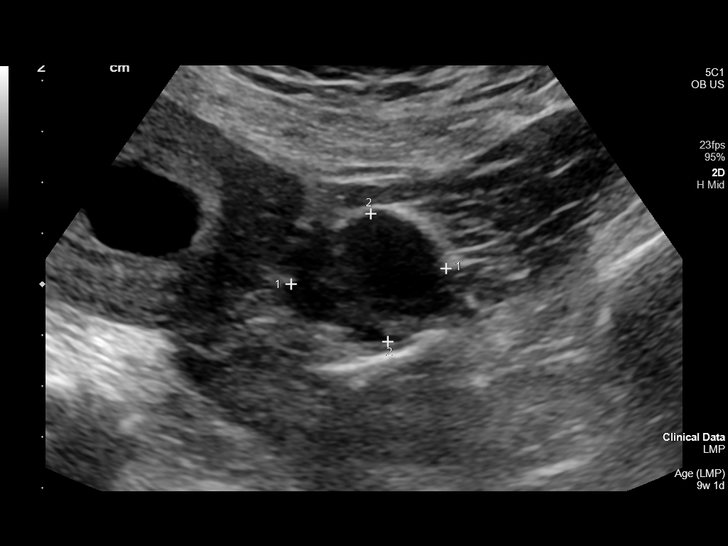
[im 28/28]
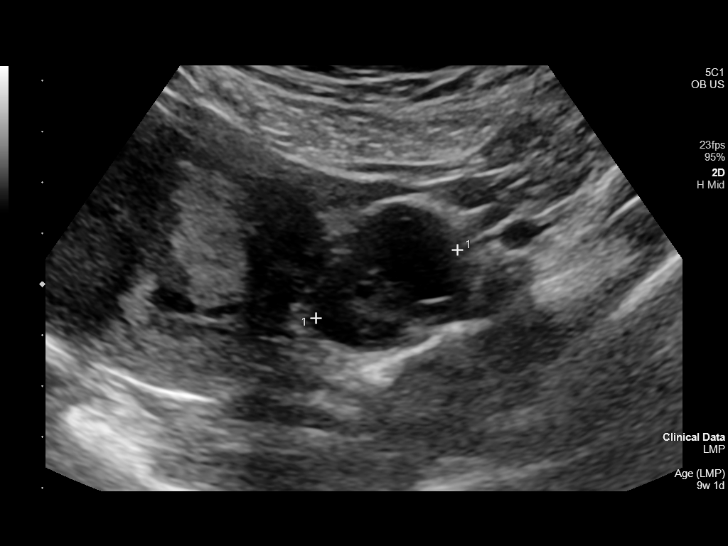

[14 of 28 positions shown; findings below may reference images not displayed]

FINDINGS: Intrauterine gestational sac: Single

Yolk sac:  Visualized.

Embryo:  Visualized.

Cardiac Activity: Visualized.

Heart Rate: 169 bpm

MSD:    mm    w     d

CRL:   19.9 mm   8 w 4 d                  US EDC: 01/26/2022

Subchorionic hemorrhage:  None visualized.

Maternal uterus/adnexae: No adnexal mass or free fluid.
IMPRESSION: Eight week 4 day intrauterine pregnancy. Fetal heart rate 169 beats
per minute. No acute maternal findings.

## 2022-01-09 ENCOUNTER — Other Ambulatory Visit: Payer: Self-pay

## 2022-01-09 ENCOUNTER — Other Ambulatory Visit
Admission: RE | Admit: 2022-01-09 | Discharge: 2022-01-09 | Disposition: A | Discharge status: Home / Self Care | Payer: Medicaid Other | Source: Ambulatory Visit | Attending: Obstetrics and Gynecology | Admitting: Obstetrics and Gynecology

## 2022-01-09 HISTORY — DX: Headache, unspecified: R51.9

## 2022-01-09 HISTORY — DX: Anemia, unspecified: D64.9

## 2022-01-09 NOTE — Patient Instructions (Addendum)
Your procedure is scheduled on: 01/15/22 - Monday ?Report to the [facility] Emergency Department at 5:45 am . ? ?REMEMBER: ?Instructions that are not followed completely may result in serious medical risk, up to and including death; or upon the discretion of your surgeon and anesthesiologist your surgery may need to be rescheduled. ? ?Do not eat food or drink any fluids after midnight the night before surgery.  ?No gum chewing, lozengers or hard candies. ? ?TAKE THESE MEDICATIONS THE MORNING OF SURGERY WITH A SIP OF WATER: None ? ?One week prior to surgery: ?Stop Anti-inflammatories (NSAIDS) such as Advil, Aleve, Ibuprofen, Motrin, Naproxen, Naprosyn and Aspirin based products such as Excedrin, Goodys Powder, BC Powder. ? ?Stop ANY OVER THE COUNTER supplements until after surgery. ? ?You may however, continue to take Tylenol if needed for pain up until the day before your surgery. ? ?No Alcohol for 24 hours before or after surgery. ? ?No Smoking including e-cigarettes for 24 hours prior to surgery.  ?No chewable tobacco products for at least 6 hours prior to surgery.  ?No nicotine patches on the day of surgery. ? ?Do not use any "recreational" drugs for at least a week prior to your surgery.  ?Please be advised that the combination of cocaine and anesthesia may have negative outcomes, up to and including death. ?If you test positive for cocaine, your surgery will be cancelled. ? ?On the morning of surgery brush your teeth with toothpaste and water, you may rinse your mouth with mouthwash if you wish. ?Do not swallow any toothpaste or mouthwash. ? ?Use CHG Soap or wipes as directed on instruction sheet. ? ?Do not wear jewelry, make-up, hairpins, clips or nail polish. ? ?Do not wear lotions, powders, or perfumes.  ? ?Do not shave body from the neck down 48 hours prior to surgery just in case you cut yourself which could leave a site for infection.  ?Also, freshly shaved skin may become irritated if using the CHG  soap. ? ?Contact lenses, hearing aids and dentures may not be worn into surgery. ? ?Do not bring valuables to the hospital. [facility] is not responsible for any missing/lost belongings or valuables.  ? ?Notify your doctor if there is any change in your medical condition (cold, fever, infection). ? ?Wear comfortable clothing (specific to your surgery type) to the hospital. ? ?After surgery, you can help prevent lung complications by doing breathing exercises.  ?Take deep breaths and cough every 1-2 hours. Your doctor may order a device called an Incentive Spirometer to help you take deep breaths. ?When coughing or sneezing, hold a pillow firmly against your incision with both hands. This is called ?splinting.? Doing this helps protect your incision. It also decreases belly discomfort. ? ?If you are being admitted to the hospital overnight, leave your suitcase in the car. ?After surgery it may be brought to your room. ? ?If you are being discharged the day of surgery, you will not be allowed to drive home. ?You will need a responsible adult (18 years or older) to drive you home and stay with you that night.  ? ?If you are taking public transportation, you will need to have a responsible adult (18 years or older) with you. ?Please confirm with your physician that it is acceptable to use public transportation.  ? ?Please call the [place] Dept. at [phone] if you have any questions about these instructions. ? ? ?Labor & Delivery ? ?Laboring women (regardless of age) may have one designated support  person and one additional visitor aged 89 and older at a time, and a doula registered with [place] for the labor and delivery phase of their stay. (Doulas not registered with [place] are counted as visitors.) The visitor (not the designated support person) may switch with other visitors. ?No children under the age of 73. ?The birthing mother can have either the designated support person or a  visitor stay overnight in the room during a multi-night stay. ? ?Mother Baby Unit, OB Specialty and Gynecological Care ? ?A designated support person and up to two additional visitors of any age may visit. ?The two visitors (not the designated support person) may switch with other visitors during the patient?s stay. ?The birthing mother/patient can have either the designated support person or a visitor aged 71 and older stay overnight in the room during a multi-night stay. ? ?Neonatal Intensive Care Unit (NICU)/Special Care Nursery [facility]) ? ?The mother of the baby and one designated support person have 24-hour access. ?Two additional visitors ages 59 and older may come during regular visiting hours only. ?No more than two visitors (including the mother) may be in the room at one time. ?No children under the age of 52. ? ?

## 2022-01-10 ENCOUNTER — Ambulatory Visit (INDEPENDENT_AMBULATORY_CARE_PROVIDER_SITE_OTHER): Payer: Medicaid Other | Admitting: Obstetrics and Gynecology

## 2022-01-10 ENCOUNTER — Encounter: Payer: Self-pay | Admitting: Obstetrics and Gynecology

## 2022-01-10 VITALS — BP 138/85 | HR 98 | Wt 171.4 lb

## 2022-01-10 DIAGNOSIS — Z3A38 38 weeks gestation of pregnancy: Secondary | ICD-10-CM

## 2022-01-10 DIAGNOSIS — Z3483 Encounter for supervision of other normal pregnancy, third trimester: Secondary | ICD-10-CM

## 2022-01-10 LAB — POCT URINALYSIS DIPSTICK OB
Bilirubin, UA: NEGATIVE
Blood, UA: NEGATIVE
Glucose, UA: NEGATIVE
Nitrite, UA: NEGATIVE
POC,PROTEIN,UA: NEGATIVE
Spec Grav, UA: >=1.03 — AB (ref 1.010–1.025)
Urobilinogen, UA: 0.2 E.U./dL
pH, UA: 6 (ref 5.0–8.0)

## 2022-01-10 NOTE — Progress Notes (Signed)
ROB. Patient states fetal movement with lower pelvic pressure. She states yesterday she experienced bad acid reflux that resulted in her throwing multiple times throughout the day. Patient reports feeling better today but was unable to keep fluids down at all yesterday and having slight bladder pain which has resolved. Patient states no questions or concerns at this time.  ? ?

## 2022-01-10 NOTE — Progress Notes (Signed)
ROB: Has no complaints or issues today.  Yesterday she had significant acid reflux but this has now resolved.  She is scheduled for cesarean delivery on Monday.  She has no further questions about that. ?

## 2022-01-12 ENCOUNTER — Other Ambulatory Visit
Admission: RE | Admit: 2022-01-12 | Discharge: 2022-01-12 | Disposition: A | Discharge status: Home / Self Care | Payer: Medicaid Other | Source: Ambulatory Visit | Attending: Obstetrics and Gynecology | Admitting: Obstetrics and Gynecology

## 2022-01-12 DIAGNOSIS — Z01812 Encounter for preprocedural laboratory examination: Secondary | ICD-10-CM | POA: Diagnosis present

## 2022-01-12 LAB — CBC
HCT: 33.6 % — ABNORMAL LOW (ref 36.0–46.0)
Hemoglobin: 10.9 g/dL — ABNORMAL LOW (ref 12.0–15.0)
MCH: 29.1 pg (ref 26.0–34.0)
MCHC: 32.4 g/dL (ref 30.0–36.0)
MCV: 89.8 fL (ref 80.0–100.0)
Platelets: 222 10*3/uL (ref 150–400)
RBC: 3.74 MIL/uL — ABNORMAL LOW (ref 3.87–5.11)
RDW: 13.8 % (ref 11.5–15.5)
WBC: 6.3 10*3/uL (ref 4.0–10.5)
nRBC: 0 % (ref 0.0–0.2)

## 2022-01-13 LAB — RPR: RPR Ser Ql: NONREACTIVE

## 2022-01-14 MED ORDER — ORAL CARE MOUTH RINSE: 15.0000 mL | Freq: Once | OROMUCOSAL | Status: AC

## 2022-01-14 MED ORDER — APREPITANT 40 MG PO CAPS
40.0000 mg | ORAL_CAPSULE | Freq: Once | ORAL | Status: DC
  Filled 2022-01-14: qty 1

## 2022-01-14 MED ORDER — LACTATED RINGERS IV SOLN: INTRAVENOUS | Status: DC

## 2022-01-14 MED ORDER — CHLORHEXIDINE GLUCONATE 0.12 % MT SOLN
15.0000 mL | Freq: Once | OROMUCOSAL | Status: AC
  Administered 2022-01-15: 15 mL via OROMUCOSAL
  Filled 2022-01-14: qty 15

## 2022-01-14 MED ORDER — GABAPENTIN 300 MG PO CAPS
300.0000 mg | ORAL_CAPSULE | ORAL | Status: AC
  Administered 2022-01-15: 300 mg via ORAL
  Filled 2022-01-14: qty 1

## 2022-01-14 MED ORDER — POVIDONE-IODINE 10 % EX SWAB
2.0000 "application " | Freq: Once | CUTANEOUS | Status: AC
  Administered 2022-01-15: 2 via TOPICAL

## 2022-01-14 MED ORDER — FAMOTIDINE 20 MG PO TABS
20.0000 mg | ORAL_TABLET | Freq: Once | ORAL | Status: AC
  Administered 2022-01-15: 20 mg via ORAL
  Filled 2022-01-14: qty 1

## 2022-01-14 MED ORDER — ACETAMINOPHEN 500 MG PO TABS
1000.0000 mg | ORAL_TABLET | ORAL | Status: AC
  Administered 2022-01-15: 1000 mg via ORAL
  Filled 2022-01-14: qty 2

## 2022-01-14 MED ORDER — LACTATED RINGERS IV SOLN: Freq: Once | INTRAVENOUS | Status: AC

## 2022-01-14 MED ORDER — SOD CITRATE-CITRIC ACID 500-334 MG/5ML PO SOLN
30.0000 mL | ORAL | Status: AC
  Administered 2022-01-15: 30 mL via ORAL

## 2022-01-14 MED ORDER — APREPITANT 40 MG PO CAPS
40.0000 mg | ORAL_CAPSULE | Freq: Once | ORAL | Status: AC
  Administered 2022-01-15: 40 mg via ORAL
  Filled 2022-01-14: qty 1

## 2022-01-14 MED ORDER — CEFAZOLIN SODIUM-DEXTROSE 2-4 GM/100ML-% IV SOLN
2.0000 g | INTRAVENOUS | Status: AC
  Administered 2022-01-15: 2 g via INTRAVENOUS
  Filled 2022-01-14: qty 100

## 2022-01-15 ENCOUNTER — Encounter: Payer: Self-pay | Admitting: Obstetrics and Gynecology

## 2022-01-15 ENCOUNTER — Inpatient Hospital Stay
Admission: RE | Admit: 2022-01-15 | Discharge: 2022-01-18 | DRG: 788 | Disposition: A | Discharge status: Home / Self Care | Payer: Medicaid Other | Source: Ambulatory Visit | Attending: Obstetrics and Gynecology | Admitting: Obstetrics and Gynecology

## 2022-01-15 ENCOUNTER — Encounter
Admission: RE | Disposition: A | Discharge status: Home / Self Care | Payer: Self-pay | Source: Ambulatory Visit | Attending: Obstetrics and Gynecology

## 2022-01-15 ENCOUNTER — Other Ambulatory Visit: Payer: Self-pay

## 2022-01-15 ENCOUNTER — Inpatient Hospital Stay: Payer: Medicaid Other | Admitting: Urgent Care

## 2022-01-15 ENCOUNTER — Inpatient Hospital Stay: Payer: Medicaid Other | Admitting: Anesthesiology

## 2022-01-15 DIAGNOSIS — O34211 Maternal care for low transverse scar from previous cesarean delivery: Principal | ICD-10-CM

## 2022-01-15 DIAGNOSIS — Z23 Encounter for immunization: Secondary | ICD-10-CM

## 2022-01-15 DIAGNOSIS — Z3490 Encounter for supervision of normal pregnancy, unspecified, unspecified trimester: Secondary | ICD-10-CM

## 2022-01-15 DIAGNOSIS — O26893 Other specified pregnancy related conditions, third trimester: Secondary | ICD-10-CM | POA: Diagnosis not present

## 2022-01-15 DIAGNOSIS — Z6791 Unspecified blood type, Rh negative: Secondary | ICD-10-CM | POA: Diagnosis not present

## 2022-01-15 DIAGNOSIS — Z3A39 39 weeks gestation of pregnancy: Secondary | ICD-10-CM | POA: Diagnosis not present

## 2022-01-15 DIAGNOSIS — O34219 Maternal care for unspecified type scar from previous cesarean delivery: Secondary | ICD-10-CM | POA: Diagnosis not present

## 2022-01-15 HISTORY — DX: Encounter for supervision of normal pregnancy, unspecified, unspecified trimester: Z34.90

## 2022-01-15 LAB — CBC
HCT: 30.9 % — ABNORMAL LOW (ref 36.0–46.0)
Hemoglobin: 10.2 g/dL — ABNORMAL LOW (ref 12.0–15.0)
MCH: 30.1 pg (ref 26.0–34.0)
MCHC: 33 g/dL (ref 30.0–36.0)
MCV: 91.2 fL (ref 80.0–100.0)
Platelets: 213 10*3/uL (ref 150–400)
RBC: 3.39 MIL/uL — ABNORMAL LOW (ref 3.87–5.11)
RDW: 14 % (ref 11.5–15.5)
WBC: 9.5 10*3/uL (ref 4.0–10.5)
nRBC: 0 % (ref 0.0–0.2)

## 2022-01-15 LAB — TYPE AND SCREEN
ABO/RH(D): O NEG
ABO/RH(D): O NEG
Antibody Screen: POSITIVE
Antibody Screen: POSITIVE
Extend sample reason: UNDETERMINED

## 2022-01-15 LAB — SAMPLE TO BLOOD BANK

## 2022-01-15 SURGERY — Surgical Case
Anesthesia: Spinal

## 2022-01-15 MED ORDER — DEXAMETHASONE SODIUM PHOSPHATE 10 MG/ML IJ SOLN
INTRAMUSCULAR | Status: DC | PRN
  Administered 2022-01-15: 10 mg via INTRAVENOUS

## 2022-01-15 MED ORDER — ONDANSETRON HCL 4 MG/2ML IJ SOLN
INTRAMUSCULAR | Status: DC | PRN
  Administered 2022-01-15: 4 mg via INTRAVENOUS

## 2022-01-15 MED ORDER — KETOROLAC TROMETHAMINE 30 MG/ML IJ SOLN
INTRAMUSCULAR | Status: DC | PRN
  Administered 2022-01-15: 30 mg via INTRAVENOUS

## 2022-01-15 MED ORDER — MORPHINE SULFATE (PF) 0.5 MG/ML IJ SOLN
INTRAMUSCULAR | Status: DC | PRN
Start: 2022-01-15 — End: 2022-01-15
  Administered 2022-01-15: .1 mg via INTRATHECAL

## 2022-01-15 MED ORDER — KETOROLAC TROMETHAMINE 30 MG/ML IJ SOLN
30.0000 mg | Freq: Four times a day (QID) | INTRAMUSCULAR | Status: AC
  Administered 2022-01-15: 30 mg via INTRAVENOUS
  Filled 2022-01-15: qty 1

## 2022-01-15 MED ORDER — OXYTOCIN-SODIUM CHLORIDE 30-0.9 UT/500ML-% IV SOLN
INTRAVENOUS | Status: DC | PRN
  Administered 2022-01-15: 300 mL via INTRAVENOUS

## 2022-01-15 MED ORDER — LIDOCAINE 5 % EX PTCH
MEDICATED_PATCH | CUTANEOUS | Status: AC
  Filled 2022-01-15: qty 1

## 2022-01-15 MED ORDER — PHENYLEPHRINE 40 MCG/ML (10ML) SYRINGE FOR IV PUSH (FOR BLOOD PRESSURE SUPPORT)
PREFILLED_SYRINGE | INTRAVENOUS | Status: DC | PRN
  Administered 2022-01-15: 40 ug via INTRAVENOUS

## 2022-01-15 MED ORDER — SIMETHICONE 80 MG PO CHEW
80.0000 mg | CHEWABLE_TABLET | Freq: Four times a day (QID) | ORAL | Status: DC
  Administered 2022-01-15 – 2022-01-18 (×10): 80 mg via ORAL
  Filled 2022-01-15 (×11): qty 1

## 2022-01-15 MED ORDER — IBUPROFEN 600 MG PO TABS
600.0000 mg | ORAL_TABLET | Freq: Four times a day (QID) | ORAL | Status: DC
  Administered 2022-01-15 – 2022-01-18 (×11): 600 mg via ORAL
  Filled 2022-01-15 (×11): qty 1

## 2022-01-15 MED ORDER — LACTATED RINGERS IV SOLN: INTRAVENOUS | Status: DC

## 2022-01-15 MED ORDER — MENTHOL 3 MG MT LOZG: 1.0000 | LOZENGE | OROMUCOSAL | Status: DC | PRN

## 2022-01-15 MED ORDER — PHENYLEPHRINE HCL-NACL 20-0.9 MG/250ML-% IV SOLN
INTRAVENOUS | Status: DC | PRN
  Administered 2022-01-15: 50 ug/min via INTRAVENOUS

## 2022-01-15 MED ORDER — LIDOCAINE 5 % EX PTCH
MEDICATED_PATCH | CUTANEOUS | Status: DC | PRN
  Administered 2022-01-15: 1 via TRANSDERMAL

## 2022-01-15 MED ORDER — ZOLPIDEM TARTRATE 5 MG PO TABS: 5.0000 mg | ORAL_TABLET | Freq: Every evening | ORAL | Status: DC | PRN

## 2022-01-15 MED ORDER — EPHEDRINE SULFATE-NACL 50-0.9 MG/10ML-% IV SOSY
PREFILLED_SYRINGE | INTRAVENOUS | Status: DC | PRN
  Administered 2022-01-15: 10 mg via INTRAVENOUS
  Administered 2022-01-15: 5 mg via INTRAVENOUS
  Administered 2022-01-15: 10 mg via INTRAVENOUS

## 2022-01-15 MED ORDER — PHENYLEPHRINE HCL-NACL 20-0.9 MG/250ML-% IV SOLN
INTRAVENOUS | Status: AC
  Filled 2022-01-15: qty 250

## 2022-01-15 MED ORDER — SENNOSIDES-DOCUSATE SODIUM 8.6-50 MG PO TABS
2.0000 | ORAL_TABLET | ORAL | Status: DC
  Administered 2022-01-15 – 2022-01-17 (×3): 2 via ORAL
  Filled 2022-01-15 (×3): qty 2

## 2022-01-15 MED ORDER — FENTANYL CITRATE (PF) 100 MCG/2ML IJ SOLN
INTRAMUSCULAR | Status: DC | PRN
  Administered 2022-01-15: 15 ug via INTRATHECAL

## 2022-01-15 MED ORDER — OXYCODONE-ACETAMINOPHEN 5-325 MG PO TABS
1.0000 | ORAL_TABLET | ORAL | Status: DC | PRN
  Administered 2022-01-16 (×2): 1 via ORAL
  Administered 2022-01-16: 2 via ORAL
  Administered 2022-01-17 (×2): 1 via ORAL
  Administered 2022-01-17: 2 via ORAL
  Filled 2022-01-15 (×6): qty 1
  Filled 2022-01-15 (×2): qty 2

## 2022-01-15 MED ORDER — FENTANYL CITRATE (PF) 100 MCG/2ML IJ SOLN
INTRAMUSCULAR | Status: AC
Start: 2022-01-15 — End: ?
  Filled 2022-01-15: qty 2

## 2022-01-15 MED ORDER — PRENATAL MULTIVITAMIN CH
1.0000 | ORAL_TABLET | Freq: Every day | ORAL | Status: DC
  Administered 2022-01-16 – 2022-01-17 (×2): 1 via ORAL
  Filled 2022-01-15 (×2): qty 1

## 2022-01-15 MED ORDER — DIPHENHYDRAMINE HCL 25 MG PO CAPS: 25.0000 mg | ORAL_CAPSULE | Freq: Four times a day (QID) | ORAL | Status: DC | PRN

## 2022-01-15 MED ORDER — PHENYLEPHRINE HCL (PRESSORS) 10 MG/ML IV SOLN
INTRAVENOUS | Status: AC
  Filled 2022-01-15: qty 1

## 2022-01-15 MED ORDER — ONDANSETRON HCL 4 MG/2ML IJ SOLN
4.0000 mg | Freq: Three times a day (TID) | INTRAMUSCULAR | Status: DC | PRN
  Administered 2022-01-15: 4 mg via INTRAVENOUS
  Filled 2022-01-15: qty 2

## 2022-01-15 MED ORDER — BUPIVACAINE IN DEXTROSE 0.75-8.25 % IT SOLN
INTRATHECAL | Status: DC | PRN
Start: 2022-01-15 — End: 2022-01-15
  Administered 2022-01-15: 1.6 mL via INTRATHECAL

## 2022-01-15 MED ORDER — MORPHINE SULFATE (PF) 0.5 MG/ML IJ SOLN
INTRAMUSCULAR | Status: AC
  Filled 2022-01-15: qty 10

## 2022-01-15 MED ORDER — OXYTOCIN-SODIUM CHLORIDE 30-0.9 UT/500ML-% IV SOLN: 2.5000 [IU]/h | INTRAVENOUS | Status: AC

## 2022-01-15 MED ORDER — OXYTOCIN-SODIUM CHLORIDE 30-0.9 UT/500ML-% IV SOLN
INTRAVENOUS | Status: AC
Start: 2022-01-15 — End: 2022-01-15
  Filled 2022-01-15: qty 1000

## 2022-01-15 MED ORDER — LIDOCAINE HCL (PF) 1 % IJ SOLN
INTRAMUSCULAR | Status: DC | PRN
  Administered 2022-01-15: 2 mL via SUBCUTANEOUS

## 2022-01-15 MED ORDER — SOD CITRATE-CITRIC ACID 500-334 MG/5ML PO SOLN
ORAL | Status: AC
  Filled 2022-01-15: qty 15

## 2022-01-15 SURGICAL SUPPLY — 34 items
ADHESIVE MASTISOL STRL (MISCELLANEOUS) ×2 IMPLANT
BACTOSHIELD CHG 4% 4OZ (MISCELLANEOUS) ×1
BAG COUNTER SPONGE SURGICOUNT (BAG) ×2 IMPLANT
BENZOIN TINCTURE PRP APPL 2/3 (GAUZE/BANDAGES/DRESSINGS) ×1 IMPLANT
CELL SAVER LIPIGURD (MISCELLANEOUS) ×1 IMPLANT
CHLORAPREP W/TINT 26 (MISCELLANEOUS) ×4 IMPLANT
CLSR STERI-STRIP ANTIMIC 1/2X4 (GAUZE/BANDAGES/DRESSINGS) ×1 IMPLANT
DEVICE RETRIEVAL ALEXIS 14 (MISCELLANEOUS) IMPLANT
DRESSING TELFA 8X3 (GAUZE/BANDAGES/DRESSINGS) ×1 IMPLANT
DRSG PAD ABDOMINAL 8X10 ST (GAUZE/BANDAGES/DRESSINGS) ×1 IMPLANT
DRSG TELFA 3X8 NADH (GAUZE/BANDAGES/DRESSINGS) ×2 IMPLANT
EXTRT SYSTEM ALEXIS 14CM (MISCELLANEOUS) ×2
GAUZE SPONGE 4X4 12PLY STRL (GAUZE/BANDAGES/DRESSINGS) ×2 IMPLANT
GLOVE SURG POLY ORTHO LF SZ7.5 (GLOVE) ×2 IMPLANT
GOWN STRL REUS W/ TWL LRG LVL3 (GOWN DISPOSABLE) ×2 IMPLANT
GOWN STRL REUS W/TWL LRG LVL3 (GOWN DISPOSABLE) ×4
KIT TURNOVER KIT A (KITS) ×2 IMPLANT
MANIFOLD NEPTUNE II (INSTRUMENTS) ×2 IMPLANT
MAT PREVALON FULL STRYKER (MISCELLANEOUS) ×2 IMPLANT
NS IRRIG 1000ML POUR BTL (IV SOLUTION) ×2 IMPLANT
PACK C SECTION AR (MISCELLANEOUS) ×2 IMPLANT
PAD DRESSING TELFA 3X8 NADH (GAUZE/BANDAGES/DRESSINGS) ×1 IMPLANT
PAD OB MATERNITY 4.3X12.25 (PERSONAL CARE ITEMS) ×2 IMPLANT
PAD PREP 24X41 OB/GYN DISP (PERSONAL CARE ITEMS) ×2 IMPLANT
RETRACTOR WND ALEXIS-O 25 LRG (MISCELLANEOUS) ×1 IMPLANT
RTRCTR WOUND ALEXIS O 25CM LRG (MISCELLANEOUS) ×2
SCRUB CHG 4% DYNA-HEX 4OZ (MISCELLANEOUS) ×1 IMPLANT
SPONGE T-LAP 18X18 ~~LOC~~+RFID (SPONGE) ×2 IMPLANT
SUT VIC AB 0 CTX 36 (SUTURE) ×4
SUT VIC AB 0 CTX36XBRD ANBCTRL (SUTURE) ×2 IMPLANT
SUT VIC AB 1 CT1 36 (SUTURE) ×4 IMPLANT
SUT VICRYL+ 3-0 36IN CT-1 (SUTURE) ×4 IMPLANT
TAPE MEDIFIX FOAM 3 (GAUZE/BANDAGES/DRESSINGS) ×1 IMPLANT
WATER STERILE IRR 500ML POUR (IV SOLUTION) ×2 IMPLANT

## 2022-01-15 NOTE — Anesthesia Procedure Notes (Signed)
Spinal ? ?Patient location during procedure: OR ?Reason for block: surgical anesthesia ?Staffing ?Performed: resident/CRNA  ?Anesthesiologist: Arita Miss, MD ?Resident/CRNA: Aline Brochure, CRNA ?Preanesthetic Checklist ?Completed: patient identified, IV checked, site marked, risks and benefits discussed, surgical consent, monitors and equipment checked, pre-op evaluation and timeout performed ?Spinal Block ?Patient position: sitting ?Prep: ChloraPrep ?Patient monitoring: heart rate, continuous pulse ox, blood pressure and cardiac monitor ?Approach: midline ?Location: L4-5 ?Injection technique: single-shot ?Needle ?Needle type: Introducer and Pencan  ?Needle gauge: 24 G ?Needle length: 9 cm ?Assessment ?Events: CSF return ?Additional Notes ?Negative paresthesia. Negative blood return. Positive free-flowing CSF. Expiration date of kit checked and confirmed. Patient tolerated procedure well, without complications. ? ? ? ? ? ?

## 2022-01-15 NOTE — Lactation Note (Signed)
This note was copied from a baby's chart. ?Lactation Consultation Note ? ?Patient Name: Holly Meyer ?Today's Date: 01/15/2022 ?Reason for consult: L&D Initial assessment;Term;RN request ?Age:27 hours ? ?Maternal Data ? ?Mom's second baby born by C-section.She has a 71 year old daughter. Per mom she encountered difficulties with breastfeeding first child who had issues with weight gain. Mom reports she experienced low milk supply. Her goal is to breastfeed this baby. ? ?Does the patient have breastfeeding experience prior to this delivery?: Yes ?How long did the patient breastfeed?: 3 weeks ? ?Feeding ?Mother's Current Feeding Choice: Breast Milk ?Mom with baby skin to skin . Assisted mom with positioning baby in cradle hold at left breast. Baby briefly latched, a swallow noted, baby detached and crying. A few additional attempts tried at right and left breast. Baby crying and would not latch again. Baby skin to skin(temp per care nurse 36.5) after feeding attempt. Discussed with mom to offer baby to latch if any signs of feeding cues. ? ?LATCH Score ?Latch: Repeated attempts needed to sustain latch, nipple held in mouth throughout feeding, stimulation needed to elicit sucking reflex. ? ?Audible Swallowing: A few with stimulation (Baby latched briefly, audible swallow noted,baby detached and would not continue.) ? ?Type of Nipple: Everted at rest and after stimulation ? ?Comfort (Breast/Nipple): Soft / non-tender ? ?Hold (Positioning): Assistance needed to correctly position infant at breast and maintain latch. ? ?LATCH Score: 7 ? ? ?Interventions ?Interventions: Breast feeding basics reviewed;Assisted with latch;Skin to skin;Breast massage;Breast compression;Adjust position;Support pillows;Position options;Education ? ?Checked back with mom after 30 minutes. Baby skin to skin and sleeping. Per mom nurse rechecked baby's temp and it is has improved. Breastfeed re-attempted. Mom able to position baby at right breast  independently. Baby crying at breast and would not latch.Calming strategy with mom's finger/LC gloved finger attempted. Baby not interested. Baby placed back with mom skin to skin. Discussed with mom LC to follow up with she and baby on mother-baby unit.  ? ?Discharge ?Pump:  (Mom reports pumping issues with first child. Per mom she did not realize the breast flanges come in different sizes. Will need to assistance with flange sizing.) ? ?Consult Status ?Consult Status: Follow-up from L&D ? ?Update provided to care nurse. ? ?Jonna  ?01/15/2022, 12:11 PM ? ? ? ?

## 2022-01-15 NOTE — Progress Notes (Signed)
Chaplain Maggie offered support to pt and spouse after delivery of baby, Elijah. Mom is anxious to visit with baby in NICU. Pt's sister arrived at bedside during visit. Hospitality, normalization of emotions, and prayer offered by Chaplain. Family appreciative of support.  ?

## 2022-01-15 NOTE — Lactation Note (Signed)
This note was copied from a baby's chart. ?Lactation Consultation Note ? ?Patient Name: Boy Caralynn Gelber ?Today's Date: 01/15/2022 ?Reason for consult: Follow-up assessment ?Age:27 hours ? ?Maternal Data ?See [place] initial consult note ? ?Feeding ?Mother's Current Feeding Choice: Breast Milk ? ?Lactation Tools Discussed/Used ?Tools: Pump (Assisted mother with pumping, reviewed with mom #24 flange fit is correct.) ?Breast pump type: Double-Electric Breast Pump ?Pump Education: Setup, frequency, and cleaning;Milk Storage ?Reason for Pumping: Baby in SCN and is currently NPO ?Pumping frequency: 8 times/24 hours ?Pumped volume:  (Mom pumped 1 drop which was brought to baby's care nurse.) ?Mom verbalized understanding to ask for labels from baby's care nurse to apply to bottles with date and time. ? ?Interventions ?Interventions: Education ? ?Discharge ?Pump: Personal (Mom has a new Spectra DEBP, a manual pump, and parts from her Medela pump she used with her first child.) ? ?Consult Status ?Consult Status: Follow-up ?Date: 01/16/22 ?Follow-up type: In-patient ? ?Update provided to [facility] care nurse Webb Silversmith ? ?Jonna  ?01/15/2022, 3:51 PM ? ? ? ?

## 2022-01-15 NOTE — H&P (Signed)
? ? ? ?History and Physical ? ? ?HPI ? ?Holly Meyer is a 26 y.o. G3P1011 at 76w0dEstimated Date of Delivery: 01/22/22 who is being admitted for C-section repeat. ? ? ?OB History ? ?OB History  ?Gravida Para Term Preterm AB Living  ?'3 1 1 '$ 0 1 1  ?SAB IAB Ectopic Multiple Live Births  ?1 0 0 0 1  ?  ?# Outcome Date GA Lbr Len/2nd Weight Sex Delivery Anes PTL Lv  ?3 Current           ?2 Term 03/25/18 [id number] g F CS-LTranv Spinal  LIV  ?   Name: JOVONCILE, SCHWARZ?   Apgar1: 8  Apgar5: 8  ?1 SAB 11/2016 9w45w0d      ? ? ?PROBLEM LIST ? ?Pregnancy complications or risks: ?Patient Active Problem List  ? Diagnosis Date Noted  ? Fall 09/24/2021  ? Patient desires vaginal birth after cesarean section (VBAC) 08/15/2021  ? Abnormal fetal lie, antepartum 03/25/2018  ? History of miscarriage, currently pregnant, first trimester 08/28/2017  ? Rh negative state in antepartum period 08/28/2017  ? Preventative health care 02/01/2017  ? Allergic rhinitis 02/28/2016  ? Headache, migraine 08/02/2015  ? Back pain 04/28/2015  ? Venous malformation 02/28/2012  ? ? ?Prenatal labs and studies: ?ABO, Rh: --/--/O NEG (03/31 1031) ?Antibody: POS (03/31 1031) ?Rubella: 3.31 (09/19 1117) ?RPR: NON REACTIVE (03/31 1031)  ?HBsAg: Negative (09/19 1117)  ?HIV: Non Reactive (09/19 1117)  ?GBSZDG:LOVFIEPP/(03/14 1150) ? ? ?Past Medical History:  ?Diagnosis Date  ? Anemia   ? Complication of anesthesia   ? complaints of nausea and vomitting when wisdom teeth were removed  ? FHx: diabetes mellitus   ? Headache   ? Septate uterus   ? Venous malformation   ? Right paraspinal  ? ? ? ?Past Surgical History:  ?Procedure Laterality Date  ? CESAREAN SECTION N/A 03/25/2018  ? Procedure: PRIMARY CESAREAN SECTION;  Surgeon: EvaHarlin HeysD;  Location: [facility] ORS;  Service: Obstetrics;  Laterality: N/A;  Female born @ [address]?  ? DILATION AND EVACUATION N/A 01/07/2017  ? Procedure: DILATATION AND EVACUATION;  Surgeon: DavHarlin HeysD;   Location: [facility] ORS;  Service: Gynecology;  Laterality: N/A;  [place]  ? ? ? ?Medications   ? ?Current Discharge Medication List  ?  ? ?CONTINUE these medications which have NOT CHANGED  ? Details  ?acetaminophen (TYLENOL) 500 MG tablet Take 500 mg by mouth every 6 (six) hours as needed for moderate pain or headache.   ?  ?fluticasone (FLONASE) 50 MCG/ACT nasal spray Place 1 spray into both nostrils daily as needed for allergies or rhinitis.  ?  ?Prenatal Vit-Fe Fumarate-FA (PRENATAL VITAMINS PO) Take 1 tablet by mouth daily.  ?  ?  ? ? ? ?Allergies ? ?Fexofenadine, Imitrex [sumatriptan], Guaifenesin, Guaifenesin er, Meloxicam, Naproxen, and Tomato ? ?Review of Systems ? ?Pertinent items noted in HPI and remainder of comprehensive ROS otherwise negative. ? ?Physical Exam ? ?BP 122/90 (BP Location: Left Arm)   Pulse 92   Temp 98.1 ?F (36.7 ?C) (Oral)   Resp 16   Ht '5\' 7"'$  (1.702 m)   Wt 78 kg   LMP 04/17/2021   SpO2 100%   BMI 26.93 kg/m?  ? ?Lungs:  CTA B ?Cardio: RRR without M/R/G ?Abd: Soft, gravid, NT ?Presentation: cephalic ?EXT: No C/C/ 1+ Edema ?DTRs: 2+ B ?CERVIX:   ? ?  See Prenatal records for more detailed PE. ? ?  ? ?FHR:  ?Variability: Good {> 6 bpm) ? ?Toco: ?Uterine Contractions: None ? ?Test Results ? ?Results for orders placed or performed during the hospital encounter of 01/15/22 (from the past 24 hour(s))  ?CBC     Status: Abnormal  ? Collection Time: 01/15/22  7:26 AM  ?Result Value Ref Range  ? WBC 9.5 4.0 - 10.5 K/uL  ? RBC 3.39 (L) 3.87 - 5.11 MIL/uL  ? Hemoglobin 10.2 (L) 12.0 - 15.0 g/dL  ? HCT 30.9 (L) 36.0 - 46.0 %  ? MCV 91.2 80.0 - 100.0 fL  ? MCH 30.1 26.0 - 34.0 pg  ? MCHC 33.0 30.0 - 36.0 g/dL  ? RDW 14.0 11.5 - 15.5 %  ? Platelets 213 150 - 400 K/uL  ? nRBC 0.0 0.0 - 0.2 %  ? ? ? ?Assessment ? ? G3P1011 at 1w0dEstimated Date of Delivery: 01/22/22  ?The fetus is reassuring.  ? ?Patient Active Problem List  ? Diagnosis Date Noted  ? Fall 09/24/2021  ?  Patient desires vaginal birth after cesarean section (VBAC) 08/15/2021  ? Abnormal fetal lie, antepartum 03/25/2018  ? History of miscarriage, currently pregnant, first trimester 08/28/2017  ? Rh negative state in antepartum period 08/28/2017  ? Preventative health care 02/01/2017  ? Allergic rhinitis 02/28/2016  ? Headache, migraine 08/02/2015  ? Back pain 04/28/2015  ? Venous malformation 02/28/2012  ? ? ?Plan ? ?1. Admit to L&D :   ?2. EFM: -- Category 1 ?3. Admission labs  ?4. CD ? ?DFinis Bud M.D. ?01/15/2022 ?8:08 AM  ?

## 2022-01-15 NOTE — Transfer of Care (Signed)
Immediate Anesthesia Transfer of Care Note ? ?Patient: Holly Meyer ? ?Procedure(s) Performed: CESAREAN SECTION ? ?Patient Location: LDR 6 ? ?Anesthesia Type:Spinal ? ?Level of Consciousness: awake, alert  and oriented ? ?Airway & Oxygen Therapy: Patient Spontanous Breathing ? ?Post-op Assessment: Report given to RN and Post -op Vital signs reviewed and stable ? ?Post vital signs: Reviewed and stable ? ?Last Vitals:  ?Vitals Value Taken Time  ?BP 112/64   ?Temp    ?Pulse 96   ?Resp 11   ?SpO2 98   ? ? ?Last Pain:  ?Vitals:  ? 01/15/22 0705  ?TempSrc: Oral  ?PainSc: 0-No pain  ?   ? ?  ? ?Complications: No notable events documented. ?

## 2022-01-15 NOTE — Anesthesia Preprocedure Evaluation (Signed)
Anesthesia Evaluation  ?Patient identified by MRN, date of birth, ID band ?Patient awake ? ? ? ?Reviewed: ?Allergy & Precautions, NPO status , Patient's Chart, lab work & pertinent test results ? ?History of Anesthesia Complications ?Negative for: history of anesthetic complications ? ?Airway ?Mallampati: II ? ?TM Distance: >3 FB ?Neck ROM: Full ? ? ? Dental ?no notable dental hx. ?(+) Teeth Intact ?  ?Pulmonary ?neg pulmonary ROS, neg sleep apnea, neg COPD, Patient abstained from smoking.Not current smoker, former smoker,  ?  ?Pulmonary exam normal ?breath sounds clear to auscultation ? ? ? ? ? ? Cardiovascular ?Exercise Tolerance: Good ?METS(-) hypertension(-) CAD and (-) Past MI negative cardio ROS ? ?(-) dysrhythmias  ?Rhythm:Regular Rate:Normal ?- Systolic murmurs ? ?  ?Neuro/Psych ? Headaches, negative psych ROS  ? GI/Hepatic ?neg GERD  ,(+)  ?  ? (-) substance abuse ? ,   ?Endo/Other  ?neg diabetes ? Renal/GU ?negative Renal ROS  ? ?  ?Musculoskeletal ? ? Abdominal ?  ?Peds ? Hematology ? ?(+) Blood dyscrasia, anemia ,   ?Anesthesia Other Findings ?Past Medical History: ?No date: Anemia ?No date: Complication of anesthesia ?    Comment:  complaints of nausea and vomitting when wisdom teeth  ?             were removed ?No date: FHx: diabetes mellitus ?No date: Headache ?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 ?No date: Venous malformation ?    Comment:  Right paraspinal ? Reproductive/Obstetrics ?(+) Pregnancy ? ?  ? ? ? ? ? ? ? ? ? ? ? ? ? ?  ?  ? ? ? ? ? ? ? ? ?Anesthesia Physical ?Anesthesia Plan ? ?ASA: 2 ? ?Anesthesia Plan: Spinal  ? ?Post-op Pain Management:   ? ?Induction:  ? ?PONV Risk Score and Plan: 3 and Ondansetron and Dexamethasone ? ?Airway Management Planned: Natural Airway ? ?Additional Equipment:  ? ?Intra-op Plan:  ? ?Post-operative Plan:  ? ?Informed Consent: I have reviewed the patients History and Physical, chart, labs and discussed the procedure including the risks,  benefits and alternatives for the proposed anesthesia with the patient or authorized representative who has indicated his/her understanding and acceptance.  ? ? ? ? ? ?Plan Discussed with: CRNA and Surgeon ? ?Anesthesia Plan Comments: (Discussed R/B/A of neuraxial anesthesia technique with patient: ?- rare risks of spinal/epidural hematoma, nerve damage, infection ?- Risk of PDPH ?- Risk of itching ?- Risk of nausea and vomiting ?- Risk of conversion to general anesthesia and its associated risks, including sore throat, damage to lips/teeth/oropharynx, and rare risks such as cardiac and respiratory events. ?- Risk of surgical bleeding requiring blood products ?- Risk of allergic reactions ?Discussed the role of CRNA in patient's perioperative care. ? ?Patient voiced understanding.)  ? ? ? ? ? ? ?Anesthesia Quick Evaluation ? ?

## 2022-01-15 NOTE — Op Note (Signed)
? ? ?    OP NOTE ? ?Date: 01/15/2022   8:58 AM ?Name Holly Meyer ?MR# [id number] ? ?Preoperative Diagnosis: ?1. Intrauterine pregnancy at [redacted]w[redacted]d?Active Problems: ?  * No active hospital problems. * ? ?2.  Previous CD - desires repeat ? ?Postoperative Diagnosis: ?1. Intrauterine pregnancy at 361w0ddelivered ?2. Viable infant ?3. Remainder same as pre-op ? ? ?Procedure: ?1. Repeat Low-Transverse Cesarean Section ? ?Surgeon: DaFinis BudMD ? ?Assistant:  ChMarcelline MatesD, ? ? ?Anesthesia: Spinal  ? ? ?EBL: 490  ml   ? ? ?Findings: ?1) female infant, Apgar scores of 8   at 1 minute and 9   at 5 minutes and a birthweight of 104.76  ounces.   ? ?2) Normal uterus, tubes and ovaries.  ? ? ?Procedure:  ?The patient was prepped and draped in the supine position and placed under spinal anesthesia.  A transverse incision was made across the abdomen in a Pfannenstiel manner. If indicated the old scar was systematically removed with sharp dissection.  We carried the dissection down to the level of the fascia.  The fascia was incised in a curvilinear manner.  The fascia was then elevated from the rectus muscles with blunt and sharp dissection.  The rectus muscles were separated laterally exposing the peritoneum.  The peritoneum was carefully entered with care being taken to avoid bowel and bladder.  A self-retaining retractor was placed.  The visceral peritoneum was incised in a curvilinear fashion across the lower uterine segment creating a bladder flap. A transverse incision was made across the lower uterine segment and extended laterally and superiorly using the bandage scissors.  Artificial rupture membranes was performed and Clear fluid was noted.  The infant was delivered from the cephalic position.  A nuchal cord was not present. After an appropriate time interval, the cord was doubly clamped and cut. Cord blood was obtained if required.  The infant was handed to the pediatric personnel  who then placed the infant under heat  lamps where it was cleaned dried and suctioned as needed. The placenta was delivered. ?The hysterotomy incision was then identified on ring forceps.  The uterine cavity was cleaned with a moist lap sponge.  The hysterotomy incision was closed with a running interlocking suture of Vicryl.  Hemostasis was excellent.  Pitocin was run in the IV and the uterus was found to be firm. ?Dr. ChMarcelline Matesssisted today as no other capable assistant was available for this surgery which requires an experienced, high level assistant. Dr. ChMarcelline Matesrovided exposure, dissection, suctioning, retraction, and general support and assistance during the procedure. ?The posterior cul-de-sac and gutters were cleaned and inspected.  Hemostasis was noted.  The fascia was then closed with a running suture of #1 Vicryl.  Hemostasis of the subcutaneous tissues was obtained using the Bovie.  The subcutaneous tissues were closed with a running suture of 000 Vicryl.  A subcuticular suture was placed.  Sterri strips were applied.  A pressure dressing was placed.  The patient went to the recovery room in stable condition. ? ? ?DaFinis BudM.D. ?01/15/2022 ?8:58 AM ? ?   ? ?

## 2022-01-16 ENCOUNTER — Encounter: Payer: Self-pay | Admitting: Obstetrics and Gynecology

## 2022-01-16 DIAGNOSIS — G8918 Other acute postprocedural pain: Secondary | ICD-10-CM | POA: Diagnosis not present

## 2022-01-16 DIAGNOSIS — R1084 Generalized abdominal pain: Secondary | ICD-10-CM | POA: Diagnosis not present

## 2022-01-16 LAB — FETAL SCREEN: Fetal Screen: NEGATIVE

## 2022-01-16 MED ORDER — RHO D IMMUNE GLOBULIN 1500 UNIT/2ML IJ SOSY
300.0000 ug | PREFILLED_SYRINGE | Freq: Once | INTRAMUSCULAR | Status: AC
  Administered 2022-01-16: 300 ug via INTRAMUSCULAR
  Filled 2022-01-16: qty 2

## 2022-01-16 NOTE — Progress Notes (Signed)
Progress Note - Cesarean Delivery ? ?Holly Meyer is a 26 y.o. [id number] now PP day 1 s/p C-Section, Low Transverse .  ? ?Subjective: ? Patient reports no problems with eating, bowel movements, voiding, or their wound ? She is currently pumping. ? ?Objective: ? ?Vital signs in last 24 hours: ?Temp:  [97.5 ?F (36.4 ?C)-98.6 ?F (37 ?C)] 98.5 ?F (36.9 ?C) (04/04 6168) ?Pulse Rate:  [60-74] 67 (04/04 0748) ?Resp:  [18-20] 20 (04/04 0748) ?BP: (107-117)/(63-84) 117/63 (04/04 0748) ?SpO2:  [91 %-100 %] 97 % (04/04 0748) ? ?Physical Exam: ? ?General: alert, cooperative, appears stated age, and no distress ?Lochia: appropriate ?Uterine Fundus: firm ?Incision: Dressing intact ?   ?Data Review ?Recent Labs  ?  01/15/22 ?0726  ?HGB 10.2*  ?HCT 30.9*  ? ? ?Assessment: ? Principal Problem: ?  Term pregnancy, repeat ? ? Status post Cesarean section. Doing well postoperatively.  ? Her baby is currently in the NICU being treated for TTN. ? ?Plan: ?      Continue current care. ? Possible discharge tomorrow. ? ?Finis Bud, M.D. ?01/16/2022 ?2:00 PM  ?

## 2022-01-16 NOTE — Anesthesia Post-op Follow-up Note (Signed)
?  Anesthesia Pain Follow-up Note ? ?Patient: Holly Meyer ? ?Day #: 1 ? ?Date of Follow-up: 01/16/2022 Time: 7:46 AM ? ?Last Vitals:  ?Vitals:  ? 01/16/22 0345 01/16/22 0600  ?BP:    ?Pulse:    ?Resp:    ?Temp:    ?SpO2: 98% 98%  ? ? ?Level of Consciousness: alert ? ?Pain: none  ? ?Side Effects:None ? ?Catheter Site Exam:clean, dry, no drainage ? ? ? ? ?Plan: D/C from anesthesia care at surgeon's request ? ? B  ? ? ? ?

## 2022-01-16 NOTE — Lactation Note (Signed)
This note was copied from a baby's chart. ?Lactation Consultation Note ? ?Patient Name: Holly Meyer ?Today's Date: 01/16/2022 ?Reason for consult: Follow-up assessment ?Age:26 years ? ?Maternal Data ? ?See initial [place] note. Mother has been spending time throughout the day in [place]. Baby has been skin to skin with mom. 1 attempt made earlier at around noon but baby with purshed lips when mom presented nipple. Mom skin to skin with baby. Discussed if baby shows feeding cues to offer baby breast. Discussed with mom stepped process as baby shows readiness to breastfeed. Also, mom with question regarding medication over the counter(Azo) while breastfeeding.  ? ?Has patient been taught Hand Expression?: Yes ?Does the patient have breastfeeding experience prior to this delivery?: Yes ?How long did the patient breastfeed?:  (See initial [place] note) ? ?Feeding ?Mother's Current Feeding Choice: Breast Milk ? ?Interventions ?Interventions: Education (Mom with question re: taking Azo while breastfeeding. Mom reports she sometimes takes azo to reduce chance of getting UTI. Per Medications and Mother's milk there is no data about transfer into breastmilk, should not be used in lactating mothers.) ? ?Discharge ?Discharge Education: Other (comment) (Reviewed with mom establishing milk supply with at least 8 pumpings/24 hours.) ? ?Consult Status ?Consult Status: Follow-up ?Date: 01/17/22 ?Follow-up type: In-patient ? ?Update provided to care nurse. ? ?Jonna  ?01/16/2022, 4:02 PM ? ? ? ?

## 2022-01-16 NOTE — Addendum Note (Signed)
Addendum  created 01/16/22 0746 by Jerrye Noble, CRNA  ? Clinical Note Signed  ?  ?

## 2022-01-16 NOTE — Anesthesia Postprocedure Evaluation (Signed)
Anesthesia Post Note ? ?Patient: Holly Meyer ? ?Procedure(s) Performed: CESAREAN SECTION ? ?Patient location during evaluation: Mother Baby ?Anesthesia Type: Spinal ?Level of consciousness: oriented and awake and alert ?Pain management: pain level controlled ?Vital Signs Assessment: post-procedure vital signs reviewed and stable ?Respiratory status: spontaneous breathing and respiratory function stable ?Cardiovascular status: blood pressure returned to baseline and stable ?Postop Assessment: no headache, no backache, no apparent nausea or vomiting and able to ambulate ?Anesthetic complications: no ? ? ?No notable events documented. ? ? ?Last Vitals:  ?Vitals:  ? 01/16/22 0345 01/16/22 0600  ?BP:    ?Pulse:    ?Resp:    ?Temp:    ?SpO2: 98% 98%  ?  ?Last Pain:  ?Vitals:  ? 01/16/22 0610  ?TempSrc:   ?PainSc: 7   ? ? ?  ?  ?  ?  ?  ?  ? ? B  ? ? ? ? ?

## 2022-01-17 LAB — RHOGAM INJECTION: Unit division: 0

## 2022-01-17 NOTE — Lactation Note (Signed)
This note was copied from a baby's chart. ?Lactation Consultation Note ? ?Patient Name: Holly Meyer ?Today's Date: 01/17/2022 ?Reason for consult: Follow-up assessment ?Age:26 years ? ?Mother of infant in SCN seen for follow-up consult. Mother was seen briefly while in the SCN with infant sleeping STS. Mother reports that she has been consistently pumping every 3 hrs and has seen a slight increase in her supply, filling the bottom of one of the bottle. She reports that she has found it easier to only pump one side at a time rather than holding both bottles to her breast.  Mother reports of no breast tenderness at this time, but did request coconut oil as she has used it in the past while pumping.  ? ?Feeding ?Mother's Current Feeding Choice: Breast Milk and Donor Milk ? ? ?Interventions ?Interventions: Coconut oil;DEBP;Education ? ?Education provided to mother regarding expected milk outputs at this point in time. Reviewed the benefits of pumping both breasts simultaneously and will provide "pumping top" for use while in the hospital. Informed mother's nurse regarding her request for coconut oil.  ? ?Discharge ?Pump: Personal (Spectra) ? ?Plan of Care ?Mother to continue to pump every 3 hrs for 8x/24hrs.  ?Mother to bring EBM to infant in SCN.  ?LC to provide pumping top and RN to provide coconut oil to improve pumping comfort.  ? ?Consult Status ?Consult Status: Follow-up ?Date: 01/17/22 ?Follow-up type: In-patient ? ? ? ? -[place], Lactation Student ?01/17/2022, 1:12 PM ? ? ? ?

## 2022-01-18 ENCOUNTER — Ambulatory Visit: Payer: Self-pay

## 2022-01-18 MED ORDER — VARICELLA VIRUS VACCINE LIVE 1350 PFU/0.5ML IJ SUSR
0.5000 mL | Freq: Once | INTRAMUSCULAR | Status: AC
  Administered 2022-01-18: 0.5 mL via SUBCUTANEOUS
  Filled 2022-01-18: qty 0.5

## 2022-01-18 MED ORDER — IBUPROFEN 600 MG PO TABS: 600.0000 mg | ORAL_TABLET | Freq: Four times a day (QID) | ORAL | 0 refills | Status: DC

## 2022-01-18 NOTE — Discharge Summary (Signed)
? ? ?Physician Obstetric Discharge Summary  ?Patient Name: Holly Meyer ?DOB: [birth year] ?MRN: [id number] ? ?                          Discharge Summary ? ?Date of Admission: 01/15/2022 ?Date of Discharge: 01/18/2022 ?Delivering Provider: Harlin Heys  ? ?Admitting Diagnosis: Term pregnancy, repeat [Z34.90] at [redacted]w[redacted]d?Secondary diagnosis:  Principal Problem: ?  Term pregnancy, repeat ? ? ?Mode of Delivery:       low uterine, transverse ?    ?Discharge diagnosis: Term Pregnancy Delivered    ?  ? ?                   Discharge Day SOAP Note: ? ?Subjective: ? The patient has no complaints.  She is ambulating well. She is taking PO well. ?Pain is well controlled with current medications. ?Patient is urinating without difficulty.   ?She is passing flatus.  ?She is currently pumping and bottlefeeding. ? ?Objective ? ?Vital signs: ?BP 122/66 (BP Location: Left Arm)   Pulse (!) 57   Temp (!) 97.5 ?F (36.4 ?C)   Resp 18   Ht '5\' 7"'$  (1.702 m)   Wt 78 kg   LMP 04/17/2021   SpO2 100%   Breastfeeding Unknown   BMI 26.93 kg/m?  ? ?Physical Exam: ?Gen: NAD ?Abdomen:  ?clean, dry, no drainage, healing ?Fundus Fundal Tone: Firm  ?Lochia Amount: None  ?   ?Data Review ?Labs: ?Lab Results  ?Component Value Date  ? WBC 9.5 01/15/2022  ? HGB 10.2 (L) 01/15/2022  ? HCT 30.9 (L) 01/15/2022  ? MCV 91.2 01/15/2022  ? PLT 213 01/15/2022  ? ? ?  Latest Ref Rng & Units 01/15/2022  ?  7:26 AM 01/12/2022  ? 10:31 AM 11/07/2021  ? 10:05 AM  ?CBC  ?WBC 4.0 - 10.5 K/uL 9.5   6.3   8.0    ?Hemoglobin 12.0 - 15.0 g/dL 10.2   10.9   10.9    ?Hematocrit 36.0 - 46.0 % 30.9   33.6   32.3    ?Platelets 150 - 400 K/uL 213   222   266    ? ?O NEG ? ?Edinburgh Score: ? ?  01/16/2022  ?  1:30 PM  ?EFlavia ShipperPostnatal Depression Scale Screening Tool  ?I have been able to laugh and see the funny side of things. 0  ?I have looked forward with enjoyment to things. 0  ?I have blamed myself unnecessarily when things went wrong. 1  ?I have been anxious or  worried for no good reason. 1  ?I have felt scared or panicky for no good reason. 0  ?Things have been getting on top of me. 1  ?I have been so unhappy that I have had difficulty sleeping. 0  ?I have felt sad or miserable. 0  ?I have been so unhappy that I have been crying. 0  ?The thought of harming myself has occurred to me. 0  ?Edinburgh Postnatal Depression Scale Total 3  ? ? ?Assessment: ? Principal Problem: ?  Term pregnancy, repeat ? ? Doing well.  Normal progress as expected. ? ?Plan: ? Discharge to home ? Modified rest as directed - may slowly resume normal activities with restrictions  as discussed. ? Medications as written. ? See below for additional. ?  ?   ?Discharge Instructions: Per After Visit Summary. ?Activity: Advance as tolerated. Pelvic rest for 6 weeks.  Also refer  to After Visit Summary.  Wound care discussed. ?Diet: Regular ?Medications: ?Allergies as of 01/18/2022   ? ?   Reactions  ? Fexofenadine Shortness Of Breath, Other (See Comments)  ? Respiratory Distress (ALLERGY/intolerance)  ? Imitrex [sumatriptan] Other (See Comments)  ? Symptoms worsened  ? Guaifenesin Other (See Comments)  ? Dizziness (intolerance)  ? Guaifenesin Er Other (See Comments)  ? Dizziness  ? Meloxicam Other (See Comments)  ? Unknown  ? Naproxen Other (See Comments)  ? Loss of appiteite  ? Tomato Rash  ? Tongue gets sores   ? ?  ? ?  ?Medication List  ?  ? ?TAKE these medications   ? ?acetaminophen 500 MG tablet ?Commonly known as: TYLENOL ?Take 500 mg by mouth every 6 (six) hours as needed for moderate pain or headache. ?  ?fluticasone 50 MCG/ACT nasal spray ?Commonly known as: FLONASE ?Place 1 spray into both nostrils daily as needed for allergies or rhinitis. ?  ?ibuprofen 600 MG tablet ?Commonly known as: ADVIL ?Take 1 tablet (600 mg total) by mouth every 6 (six) hours. ?  ?PRENATAL VITAMINS PO ?Take 1 tablet by mouth daily. ?  ? ?  ? ?  ?  ? ? ?  ?Discharge Care Instructions  ?(From admission, onward)  ?  ? ? ?  ? ?   Start     Ordered  ? 01/18/22 0000  No dressing needed       ?Comments: Keep wound area clean and dry  ? 01/18/22 0841  ? ?  ?  ? ?  ? ?Outpatient follow up:  ? Follow-up Information   ? ? Harlin Heys, MD Follow up in 1 week(s).   ?Specialties: Obstetrics and Gynecology, Radiology ?Contact information: ?[address] ?[address] ?[place] [address] ?[phone] ? ? ?  ?  ? ?  ?  ? ?  ? ?Postpartum contraception: Will discuss at first post-partum visit. ? ?Discharged Condition: good ? ?Discharged to: home ? ?Newborn Data: ?Disposition:NICU  -baby has TTN but is improving daily. ? ?Apgars: APGAR (1 MIN): 8   ?APGAR (5 MINS): 9   ?APGAR (10 MINS):   ? ?Baby Feeding: Bottlefeeding pumped breast milk ? ? ?Finis Bud, M.D. ?01/18/2022 ?8:42 AM  ?

## 2022-01-18 NOTE — Lactation Note (Signed)
This note was copied from a baby's chart. ?Lactation Consultation Note ? ?Patient Name: Boy Lynzy Rawles ?Today's Date: 01/18/2022 ?Reason for consult: Follow-up assessment ?Age:27 hours ? ?Maternal Data ? See initial [place] note. ? ?Feeding ?Mother's Current Feeding Choice: Breast Milk and Donor Milk ? ?Interventions: Education ? ?Discharge ?Discharge Education: Other (comment) (Reviewed storage , collection, and transport of breastmilk for baby in SCN. Mom verbalized understanding of information provided.) ?Pump: Personal (Mom has a [employer] breastpump. Reviewed tips and strategies to maximize stimulation/expression phases of the Spectra pump and when mom is at bedside in the SCN with the Medela pump. Mom is getting measurable amounts of breastmilk.) ? ?Consult Status ?Consult Status: PRN ?Follow-up type: Other (comment) (Mom will discharge to home today. Baby will remain in [place].) Mom understands she can request [facility] assistance as needed when baby is ready to transition to breastfeeding. ? ? ? ?Jonna  ?01/18/2022, 10:50 AM ? ? ? ?

## 2022-01-18 NOTE — Progress Notes (Signed)
Patient discharged home. Rx sent to pharmacy. PP instructions given and reviewed with patient. Patient verbalized understanding. Patient escorted out by auxiliary.  ? ?Erlene Quan ?01/18/2022 ?10:49 AM ? ?

## 2022-01-24 ENCOUNTER — Encounter: Payer: Self-pay | Admitting: Obstetrics and Gynecology

## 2022-01-24 ENCOUNTER — Ambulatory Visit (INDEPENDENT_AMBULATORY_CARE_PROVIDER_SITE_OTHER): Payer: Medicaid Other | Admitting: Obstetrics and Gynecology

## 2022-01-24 VITALS — BP 120/78 | HR 67 | Ht 67.0 in | Wt 161.2 lb

## 2022-01-24 DIAGNOSIS — Z9889 Other specified postprocedural states: Secondary | ICD-10-CM

## 2022-01-24 NOTE — Progress Notes (Signed)
Patient presents today for 1 week postpartum incision check. She states that she has left sided burning pain after exertion but with rest the pain goes away. Patient states no other questions or concerns. ?

## 2022-01-24 NOTE — Progress Notes (Signed)
HPI: ?     [name] is a 26 y.o. [id number] who LMP was No LMP recorded. ? ?Subjective:  ? ?She presents today 1 week from cesarean delivery.  She reports she is doing well.  Has occasional burning on her left side but once she sits down and relaxes this dissipates.  She continues to pump and breast-feed her baby who remains in the NICU.  She says he is doing everything well except he has some desaturation when he is not on oxygen.  The expectation is that he will go home in the next 1 to 2 days.  She goes to see him daily. ? ?  Hx: ?The following portions of the patient's history were reviewed and updated as appropriate: ?            She  has a past medical history of Anemia, Complication of anesthesia, FHx: diabetes mellitus, Headache, Septate uterus, and Venous malformation. ?She does not have any pertinent problems on file. ?She  has a past surgical history that includes Wisdom tooth extraction (2012); Dilation and evacuation (N/A, 01/07/2017); Cesarean section (N/A, 03/25/2018); and Cesarean section (N/A, 01/15/2022). ?Her family history includes Alzheimer's disease in her maternal grandmother; Diabetes in her father, mother, paternal grandfather, and paternal grandmother; Heart failure in her paternal grandfather; Hypertension in her father; Migraines in her mother. ?She  reports that she has quit smoking. Her smoking use included cigarettes. She smoked an average of .25 packs per day. She has never used smokeless tobacco. She reports that she does not currently use alcohol. She reports that she does not use drugs. ?She has a current medication list which includes the following prescription(s): acetaminophen, fluticasone, ibuprofen, and prenatal vit-fe fumarate-fa. ?She is allergic to fexofenadine, imitrex [sumatriptan], guaifenesin, guaifenesin er, meloxicam, naproxen, and tomato. ?      ?Review of Systems:  ?Review of Systems ? ?Constitutional: Denied constitutional symptoms, night sweats, recent  illness, fatigue, fever, insomnia and weight loss.  ?Eyes: Denied eye symptoms, eye pain, photophobia, vision change and visual disturbance.  ?Ears/Nose/Throat/Neck: Denied ear, nose, throat or neck symptoms, hearing loss, nasal discharge, sinus congestion and sore throat.  ?Cardiovascular: Denied cardiovascular symptoms, arrhythmia, chest pain/pressure, edema, exercise intolerance, orthopnea and palpitations.  ?Respiratory: Denied pulmonary symptoms, asthma, pleuritic pain, productive sputum, cough, dyspnea and wheezing.  ?Gastrointestinal: Denied, gastro-esophageal reflux, melena, nausea and vomiting.  ?Genitourinary: Denied genitourinary symptoms including symptomatic vaginal discharge, pelvic relaxation issues, and urinary complaints.  ?Musculoskeletal: Denied musculoskeletal symptoms, stiffness, swelling, muscle weakness and myalgia.  ?Dermatologic: Denied dermatology symptoms, rash and scar.  ?Neurologic: Denied neurology symptoms, dizziness, headache, neck pain and syncope.  ?Psychiatric: Denied psychiatric symptoms, anxiety and depression.  ?Endocrine: Denied endocrine symptoms including hot flashes and night sweats.  ? ?Meds: ?  ?Current Outpatient Medications on File Prior to Visit  ?Medication Sig Dispense Refill  ? acetaminophen (TYLENOL) 500 MG tablet Take 500 mg by mouth every 6 (six) hours as needed for moderate pain or headache.     ? fluticasone (FLONASE) 50 MCG/ACT nasal spray Place 1 spray into both nostrils daily as needed for allergies or rhinitis.    ? ibuprofen (ADVIL) 600 MG tablet Take 1 tablet (600 mg total) by mouth every 6 (six) hours. 30 tablet 0  ? Prenatal Vit-Fe Fumarate-FA (PRENATAL VITAMINS PO) Take 1 tablet by mouth daily.    ? ?No current facility-administered medications on file prior to visit.  ? ? ? ? ?Objective:  ?  ? ?Vitals:  ?  01/24/22 0848  ?BP: 120/78  ?Pulse: 67  ? ?Filed Weights  ? 01/24/22 0848  ?Weight: 161 lb 3.2 oz (73.1 kg)  ? ?  ?          ?Abdomen: Soft.   Non-tender.  No masses.  No HSM.  ?Incision/s: Intact.  Healing well.  No erythema.  No drainage.  ? ? ?        ? ?Assessment:  ?  ?[id number] ?Patient Active Problem List  ? Diagnosis Date Noted  ? Term pregnancy, repeat 01/15/2022  ? Fall 09/24/2021  ? Patient desires vaginal birth after cesarean section (VBAC) 08/15/2021  ? Abnormal fetal lie, antepartum 03/25/2018  ? History of miscarriage, currently pregnant, first trimester 08/28/2017  ? Rh negative state in antepartum period 08/28/2017  ? Preventative health care 02/01/2017  ? Allergic rhinitis 02/28/2016  ? Headache, migraine 08/02/2015  ? Back pain 04/28/2015  ? Venous malformation 02/28/2012  ? ?  ?1. Postoperative state   ?2. Postpartum care following cesarean delivery   ? ? Patient with excellent recovery status post cesarean delivery ? ? ?Plan:  ?  ?       ? 1.  Wound care discussed.  No heavy lifting. ?Orders ?No orders of the defined types were placed in this encounter. ? ? No orders of the defined types were placed in this encounter. ?  ?  F/U ? Return in about 5 weeks (around 02/28/2022). ? ?Finis Bud, M.D. ?01/24/2022 ?9:05 AM ? ? ? ? ?

## 2022-02-28 ENCOUNTER — Encounter: Payer: Medicaid Other | Admitting: Obstetrics and Gynecology

## 2022-03-21 ENCOUNTER — Encounter: Payer: Self-pay | Admitting: Obstetrics and Gynecology

## 2022-03-21 ENCOUNTER — Ambulatory Visit (INDEPENDENT_AMBULATORY_CARE_PROVIDER_SITE_OTHER): Payer: Medicaid Other | Admitting: Obstetrics and Gynecology

## 2022-03-21 DIAGNOSIS — F53 Postpartum depression: Secondary | ICD-10-CM

## 2022-03-21 MED ORDER — DESOGESTREL-ETHINYL ESTRADIOL 0.15-0.02/0.01 MG (21/5) PO TABS: 1.0000 | ORAL_TABLET | Freq: Every day | ORAL | 1 refills | Status: DC

## 2022-03-21 MED ORDER — SERTRALINE HCL 50 MG PO TABS: 50.0000 mg | ORAL_TABLET | Freq: Every day | ORAL | 2 refills | Status: DC

## 2022-03-21 NOTE — Progress Notes (Signed)
HPI:      [name] is a 26 y.o. [phone] who LMP was No LMP recorded.  Subjective:   She presents today approximately 6 weeks postpartum from cesarean delivery.  Baby is now at home and growing well.  She is bottlefeeding.  She reports that she has resumed intercourse without protection-no issues.  She desires OCPs for birth control. She does state that she has had a significant increase in her anxiety and is very emotional to the point where she cannot control her emotions sometimes.  She feels "out of control".     Hx: The following portions of the patient's history were reviewed and updated as appropriate:             She  has a past medical history of Anemia, Complication of anesthesia, FHx: diabetes mellitus, Headache, Septate uterus, and Venous malformation. She does not have any pertinent problems on file. She  has a past surgical history that includes Wisdom tooth extraction (2012); Dilation and evacuation (N/A, 01/07/2017); Cesarean section (N/A, 03/25/2018); and Cesarean section (N/A, 01/15/2022). Her family history includes Alzheimer's disease in her maternal grandmother; Diabetes in her father, mother, paternal grandfather, and paternal grandmother; Heart failure in her paternal grandfather; Hypertension in her father; Migraines in her mother. She  reports that she has quit smoking. Her smoking use included cigarettes. She smoked an average of .25 packs per day. She has never used smokeless tobacco. She reports that she does not currently use alcohol. She reports that she does not use drugs. She has a current medication list which includes the following prescription(s): acetaminophen, desogestrel-ethinyl estradiol, fluticasone, ibuprofen, and sertraline. She is allergic to fexofenadine, imitrex [sumatriptan], guaifenesin, guaifenesin er, meloxicam, naproxen, and tomato.       Review of Systems:  Review of Systems  Constitutional: Denied constitutional symptoms, night sweats,  recent illness, fatigue, fever, insomnia and weight loss.  Eyes: Denied eye symptoms, eye pain, photophobia, vision change and visual disturbance.  Ears/Nose/Throat/Neck: Denied ear, nose, throat or neck symptoms, hearing loss, nasal discharge, sinus congestion and sore throat.  Cardiovascular: Denied cardiovascular symptoms, arrhythmia, chest pain/pressure, edema, exercise intolerance, orthopnea and palpitations.  Respiratory: Denied pulmonary symptoms, asthma, pleuritic pain, productive sputum, cough, dyspnea and wheezing.  Gastrointestinal: Denied, gastro-esophageal reflux, melena, nausea and vomiting.  Genitourinary: Denied genitourinary symptoms including symptomatic vaginal discharge, pelvic relaxation issues, and urinary complaints.  Musculoskeletal: Denied musculoskeletal symptoms, stiffness, swelling, muscle weakness and myalgia.  Dermatologic: Denied dermatology symptoms, rash and scar.  Neurologic: Denied neurology symptoms, dizziness, headache, neck pain and syncope.  Psychiatric: See HPI for additional information.  Endocrine: Denied endocrine symptoms including hot flashes and night sweats.   Meds:   Current Outpatient Medications on File Prior to Visit  Medication Sig Dispense Refill   acetaminophen (TYLENOL) 500 MG tablet Take 500 mg by mouth every 6 (six) hours as needed for moderate pain or headache.      fluticasone (FLONASE) 50 MCG/ACT nasal spray Place 1 spray into both nostrils daily as needed for allergies or rhinitis.     ibuprofen (ADVIL) 600 MG tablet Take 1 tablet (600 mg total) by mouth every 6 (six) hours. 30 tablet 0   No current facility-administered medications on file prior to visit.      Objective:     Vitals:   03/21/22 0956  BP: 122/83  Pulse: 86   Filed Weights   03/21/22 0956  Weight: 171 lb 6.4 oz (77.7 kg)  Abdomen: Soft.  Non-tender.  No masses.  No HSM.  Incision/s: Intact.  Healing well.  No erythema.  No drainage.              Assessment:    [id number] Patient Active Problem List   Diagnosis Date Noted   Term pregnancy, repeat 01/15/2022   Fall 09/24/2021   Patient desires vaginal birth after cesarean section (VBAC) 08/15/2021   Abnormal fetal lie, antepartum 03/25/2018   History of miscarriage, currently pregnant, first trimester 08/28/2017   Rh negative state in antepartum period 08/28/2017   Preventative health care 02/01/2017   Allergic rhinitis 02/28/2016   Headache, migraine 08/02/2015   Back pain 04/28/2015   Venous malformation 02/28/2012     1. Postpartum care following cesarean delivery   2. Post-partum depression     Patient with anxiety and depression postpartum.  Elevated depression scores as well as disruptive emotions.   Plan:            1.  Zoloft prescribed.  I have asked the patient to check in in 3 weeks.  2.  Desires OCPs for birth control.  First day first day start.  Mircette prescribed.  Patient instructed in their use.  Recommended abstinence or condoms for birth control until pills begin. Orders No orders of the defined types were placed in this encounter.    Meds ordered this encounter  Medications   desogestrel-ethinyl estradiol (MIRCETTE) 0.15-0.02/0.01 MG (21/5) tablet    Sig: Take 1 tablet by mouth at bedtime.    Dispense:  84 tablet    Refill:  1   sertraline (ZOLOFT) 50 MG tablet    Sig: Take 1 tablet (50 mg total) by mouth daily.    Dispense:  30 tablet    Refill:  2      F/U  Return in about 3 weeks (around 04/11/2022).  Finis Bud, M.D. 03/21/2022 10:10 AM

## 2022-03-21 NOTE — Progress Notes (Signed)
Patient presents today for 6 week postpartum follow-up. She had a cesarean delivery on 01/15/22. Patient is bottle feeding. She states she would like OCP's for birth control. She states she would like to discuss her current anxiety, EPDS score of 16. Patient states no other questions or concerns at this time.

## 2022-04-10 ENCOUNTER — Telehealth (INDEPENDENT_AMBULATORY_CARE_PROVIDER_SITE_OTHER): Payer: Medicaid Other | Admitting: Obstetrics and Gynecology

## 2022-04-10 ENCOUNTER — Encounter: Payer: Self-pay | Admitting: Obstetrics and Gynecology

## 2022-04-10 DIAGNOSIS — F53 Postpartum depression: Secondary | ICD-10-CM | POA: Diagnosis not present

## 2022-04-10 MED ORDER — SERTRALINE HCL 50 MG PO TABS: 50.0000 mg | ORAL_TABLET | Freq: Every day | ORAL | 1 refills | Status: DC

## 2022-04-10 NOTE — Progress Notes (Signed)
Virtual Visit via Video Note  I connected with Holly Meyer on 04/10/22 at  7:30 AM EDT by video and verified that I was speaking with the correct person using two identifiers.    Holly Meyer is a 26 y.o. [phone] who LMP was No LMP recorded. I discussed the limitations, risks, security and privacy concerns of performing an evaluation and management service by video and the availability of in person appointments. I also discussed with the patient that there may be a patient responsible charge related to this service. The patient expressed understanding and agreed to proceed.  Location of patient:  Home  Patient gave explicit verbal consent for video visit:  YES  Location of provider:  [facility] office  Persons other than physician and patient involved in provider conference:  None   Subjective:   History of Present Illness:    Holly Meyer is approximately 3 months postpartum and at her last visit had been experiencing some difficulty with uncontrolled mood issues likely consistent with postpartum depression.  After discussion we decided that she will benefit from Zoloft. Since that time she has been taking Zoloft reports that she notices that her mood changes are much improved.  She is not having any significant episodes that she was having before.  She is taking the medicine as prescribed.  She would like to continue the Zoloft. She reports that she is otherwise doing well.  She says the baby is just started daycare and so far that has gone well.  Hx: The following portions of the patient's history were reviewed and updated as appropriate:             She  has a past medical history of Anemia, Complication of anesthesia, FHx: diabetes mellitus, Headache, Septate uterus, and Venous malformation. She does not have any pertinent problems on file. She  has a past surgical history that includes Wisdom tooth extraction (2012); Dilation and evacuation (N/A, 01/07/2017); Cesarean section (N/A,  03/25/2018); and Cesarean section (N/A, 01/15/2022). Her family history includes Alzheimer's disease in her maternal grandmother; Diabetes in her father, mother, paternal grandfather, and paternal grandmother; Heart failure in her paternal grandfather; Hypertension in her father; Migraines in her mother. She  reports that she has quit smoking. Her smoking use included cigarettes. She smoked an average of .25 packs per day. She has never used smokeless tobacco. She reports that she does not currently use alcohol. She reports that she does not use drugs. She has a current medication list which includes the following prescription(s): acetaminophen, desogestrel-ethinyl estradiol, fluticasone, ibuprofen, and sertraline. She is allergic to fexofenadine, imitrex [sumatriptan], guaifenesin, guaifenesin er, meloxicam, naproxen, and tomato.       Review of Systems:  Review of Systems  Constitutional: Denied constitutional symptoms, night sweats, recent illness, fatigue, fever, insomnia and weight loss.  Eyes: Denied eye symptoms, eye pain, photophobia, vision change and visual disturbance.  Ears/Nose/Throat/Neck: Denied ear, nose, throat or neck symptoms, hearing loss, nasal discharge, sinus congestion and sore throat.  Cardiovascular: Denied cardiovascular symptoms, arrhythmia, chest pain/pressure, edema, exercise intolerance, orthopnea and palpitations.  Respiratory: Denied pulmonary symptoms, asthma, pleuritic pain, productive sputum, cough, dyspnea and wheezing.  Gastrointestinal: Denied, gastro-esophageal reflux, melena, nausea and vomiting.  Genitourinary: Denied genitourinary symptoms including symptomatic vaginal discharge, pelvic relaxation issues, and urinary complaints.  Musculoskeletal: Denied musculoskeletal symptoms, stiffness, swelling, muscle weakness and myalgia.  Dermatologic: Denied dermatology symptoms, rash and scar.  Neurologic: Denied neurology symptoms, dizziness, headache, neck pain and  syncope.  Psychiatric:  Denied psychiatric symptoms, anxiety and depression.  Endocrine: Denied endocrine symptoms including hot flashes and night sweats.   Meds:   Current Outpatient Medications on File Prior to Visit  Medication Sig Dispense Refill   acetaminophen (TYLENOL) 500 MG tablet Take 500 mg by mouth every 6 (six) hours as needed for moderate pain or headache.      desogestrel-ethinyl estradiol (MIRCETTE) 0.15-0.02/0.01 MG (21/5) tablet Take 1 tablet by mouth at bedtime. 84 tablet 1   fluticasone (FLONASE) 50 MCG/ACT nasal spray Place 1 spray into both nostrils daily as needed for allergies or rhinitis.     ibuprofen (ADVIL) 600 MG tablet Take 1 tablet (600 mg total) by mouth every 6 (six) hours. 30 tablet 0   No current facility-administered medications on file prior to visit.    Assessment:    [id number] Patient Active Problem List   Diagnosis Date Noted   Term pregnancy, repeat 01/15/2022   Fall 09/24/2021   Patient desires vaginal birth after cesarean section (VBAC) 08/15/2021   Abnormal fetal lie, antepartum 03/25/2018   History of miscarriage, currently pregnant, first trimester 08/28/2017   Rh negative state in antepartum period 08/28/2017   Preventative health care 02/01/2017   Allergic rhinitis 02/28/2016   Headache, migraine 08/02/2015   Back pain 04/28/2015   Venous malformation 02/28/2012     1. Post-partum depression     Patient states that she is much improved on Zoloft and would like to continue.  Plan:            1.  Continue Zoloft as prescribed.  We discussed length of time and we will evaluate again in 6 months for continuation or discontinuation of Zoloft at that time. Orders No orders of the defined types were placed in this encounter.    Meds ordered this encounter  Medications   sertraline (ZOLOFT) 50 MG tablet    Sig: Take 1 tablet (50 mg total) by mouth daily.    Dispense:  90 tablet    Refill:  1      F/U  Return in about 3 months  (around 07/11/2022). I spent 13 minutes involved in the care of this patient preparing to see the patient by obtaining and reviewing her medical history (including labs, imaging tests and prior procedures), documenting clinical information in the electronic health record (EHR), counseling and coordinating care plans, writing and sending prescriptions, ordering tests or procedures and in direct communicating with the patient and medical staff discussing pertinent items from her history and physical exam.   Elonda Husky, M.D. 04/10/2022 7:39 AM

## 2022-04-11 ENCOUNTER — Telehealth: Payer: Medicaid Other | Admitting: Obstetrics and Gynecology

## 2022-04-11 ENCOUNTER — Encounter: Payer: Medicaid Other | Admitting: Obstetrics and Gynecology

## 2022-05-16 ENCOUNTER — Telehealth: Payer: Medicaid Other | Admitting: Obstetrics and Gynecology

## 2022-05-17 ENCOUNTER — Encounter: Payer: Self-pay | Admitting: Obstetrics and Gynecology

## 2022-05-17 ENCOUNTER — Telehealth (INDEPENDENT_AMBULATORY_CARE_PROVIDER_SITE_OTHER): Payer: Medicaid Other | Admitting: Obstetrics and Gynecology

## 2022-05-17 VITALS — Ht 67.0 in | Wt 170.0 lb

## 2022-05-17 DIAGNOSIS — F419 Anxiety disorder, unspecified: Secondary | ICD-10-CM | POA: Diagnosis not present

## 2022-05-17 DIAGNOSIS — F53 Postpartum depression: Secondary | ICD-10-CM
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
## 2022-05-17 NOTE — Progress Notes (Signed)
Patient presents via video visit to follow up on medication. She states her depression is getting better but her anxiety is worse. No other concerns at this time.

## 2022-07-16 ENCOUNTER — Ambulatory Visit
Admission: EM | Admit: 2022-07-16 | Discharge: 2022-07-16 | Disposition: A | Discharge status: Home / Self Care | Payer: Medicaid Other | Attending: Internal Medicine | Admitting: Internal Medicine

## 2022-07-16 ENCOUNTER — Encounter: Payer: Self-pay | Admitting: Obstetrics and Gynecology

## 2022-07-16 DIAGNOSIS — N3090 Cystitis, unspecified without hematuria: Secondary | ICD-10-CM

## 2022-07-16 LAB — POCT URINALYSIS DIP (MANUAL ENTRY)
Glucose, UA: 250 mg/dL — AB
Nitrite, UA: POSITIVE — AB
Protein Ur, POC: 100 mg/dL — AB
Spec Grav, UA: <=1.005 — AB (ref 1.010–1.025)
Urobilinogen, UA: >=8 E.U./dL — AB
pH, UA: 5 (ref 5.0–8.0)

## 2022-07-16 MED ORDER — PHENAZOPYRIDINE HCL 200 MG PO TABS: 200.0000 mg | ORAL_TABLET | Freq: Three times a day (TID) | ORAL | 0 refills | Status: DC

## 2022-07-16 MED ORDER — NITROFURANTOIN MONOHYD MACRO 100 MG PO CAPS: 100.0000 mg | ORAL_CAPSULE | Freq: Two times a day (BID) | ORAL | 0 refills | Status: DC

## 2022-07-16 NOTE — ED Provider Notes (Signed)
[facility]    CSN: [id number] Arrival date & time: 07/16/22  1001      History   Chief Complaint Chief Complaint  Patient presents with   Dysuria    HPI Holly Meyer is a 26 y.o. female.   26 year old female presents with frequency and dysuria.  Patient indicates that for the past 3 days she has been having frequency, urgency, and dysuria.  She relates that she is been taking Azo that did relieve her symptoms yesterday but today is not working as well.  She relates that she has not had any fever, chills, or nausea.  She has had some mild lower back discomfort with the symptoms.  She indicates that her last episode of cystitis was about 6 to 7 months ago.  She relates she is tolerating fluids well.   Dysuria   Past Medical History:  Diagnosis Date   Anemia    Complication of anesthesia    complaints of nausea and vomitting when wisdom teeth were removed   FHx: diabetes mellitus    Headache    Septate uterus    Venous malformation    Right paraspinal    Patient Active Problem List   Diagnosis Date Noted   Term pregnancy, repeat 01/15/2022   Fall 09/24/2021   Patient desires vaginal birth after cesarean section (VBAC) 08/15/2021   Abnormal fetal lie, antepartum 03/25/2018   History of miscarriage, currently pregnant, first trimester 08/28/2017   Rh negative state in antepartum period 08/28/2017   Preventative health care 02/01/2017   Allergic rhinitis 02/28/2016   Headache, migraine 08/02/2015   Back pain 04/28/2015   Venous malformation 02/28/2012    Past Surgical History:  Procedure Laterality Date   CESAREAN SECTION N/A 03/25/2018   Procedure: PRIMARY CESAREAN SECTION;  Surgeon: Harlin Heys, MD;  Location: [facility];  Service: Obstetrics;  Laterality: N/A;  Female born @ [facility] N/A 01/15/2022   Procedure: CESAREAN SECTION;  Surgeon: Harlin Heys, MD;  Location: [facility];  Service: Obstetrics;  Laterality: N/A;    DILATION AND EVACUATION N/A 01/07/2017   Procedure: DILATATION AND EVACUATION;  Surgeon: Harlin Heys, MD;  Location: [facility];  Service: Gynecology;  Laterality: N/A;   WISDOM TOOTH EXTRACTION  2012    OB History     Gravida  3   Para  2   Term  2   Preterm  0   AB  1   Living  2      SAB  1   IAB  0   Ectopic  0   Multiple  0   Live Births  2            Home Medications    Prior to Admission medications   Medication Sig Start Date End Date Taking? Authorizing Provider  nitrofurantoin, macrocrystal-monohydrate, (MACROBID) 100 MG capsule Take 1 capsule (100 mg total) by mouth 2 (two) times daily. 07/16/22  Yes Nyoka Lint, PA-C  phenazopyridine (PYRIDIUM) 200 MG tablet Take 1 tablet (200 mg total) by mouth 3 (three) times daily. 07/16/22  Yes Nyoka Lint, PA-C  acetaminophen (TYLENOL) 500 MG tablet Take 500 mg by mouth every 6 (six) hours as needed for moderate pain or headache.     [provider]  desogestrel-ethinyl estradiol (MIRCETTE) 0.15-0.02/0.01 MG (21/5) tablet Take 1 tablet by mouth at bedtime. 03/21/22   Harlin Heys, MD  fluticasone (FLONASE) 50 MCG/ACT nasal spray Place 1  spray into both nostrils daily as needed for allergies or rhinitis.    [provider]  ibuprofen (ADVIL) 600 MG tablet Take 1 tablet (600 mg total) by mouth every 6 (six) hours. 01/18/22   Harlin Heys, MD  sertraline (ZOLOFT) 50 MG tablet Take 1 tablet (50 mg total) by mouth daily. 04/10/22   Harlin Heys, MD    Family History Family History  Problem Relation Age of Onset   Migraines Mother    Diabetes Mother    Hypertension Father    Diabetes Father    Alzheimer's disease Maternal Grandmother    Diabetes Paternal Grandmother        family history   Heart failure Paternal Grandfather    Diabetes Paternal Grandfather     Social History Social History   Tobacco Use   Smoking status: Former    Packs/day: 0.25    Types: Cigarettes    Smokeless tobacco: Never   Tobacco comments:    recently quit  Vaping Use   Vaping Use: Never used  Substance Use Topics   Alcohol use: Not Currently    Alcohol/week: 0.0 standard drinks of alcohol    Comment: socially   Drug use: No     Allergies   Fexofenadine, Imitrex [sumatriptan], Guaifenesin, Guaifenesin er, Meloxicam, Naproxen, and Tomato   Review of Systems Review of Systems  Genitourinary:  Positive for dysuria and frequency.     Physical Exam Triage Vital Signs ED Triage Vitals [07/16/22 1027]  Enc Vitals Group     BP (!) 136/90     Pulse Rate 88     Resp 16     Temp 98.3 F (36.8 C)     Temp Source Oral     SpO2 96 %     Weight      Height      Head Circumference      Peak Flow      Pain Score 0     Pain Loc      Pain Edu?      Excl. in [place]?    No data found.  Updated Vital Signs BP (!) 136/90 (BP Location: Left Arm)   Pulse 88   Temp 98.3 F (36.8 C) (Oral)   Resp 16   SpO2 96%   Breastfeeding No   Visual Acuity Right Eye Distance:   Left Eye Distance:   Bilateral Distance:    Right Eye Near:   Left Eye Near:    Bilateral Near:     Physical Exam Constitutional:      Appearance: Normal appearance.  Abdominal:     General: Abdomen is flat. Bowel sounds are normal.     Palpations: Abdomen is soft.     Tenderness: There is abdominal tenderness in the suprapubic area. There is no guarding or rebound.  Neurological:     Mental Status: She is alert.      UC Treatments / Results  Labs (all labs ordered are listed, but only abnormal results are displayed) Labs Reviewed  POCT URINALYSIS DIP (MANUAL ENTRY) - Abnormal; Notable for the following components:      Result Value   Color, UA orange (*)    Clarity, UA cloudy (*)    Glucose, UA =250 (*)    Bilirubin, UA small (*)    Ketones, POC UA small (15) (*)    Spec Grav, UA <=1.005 (*)    Blood, UA trace-intact (*)    Protein Ur, POC =100 (*)  Urobilinogen, UA >=8.0 (*)     Nitrite, UA Positive (*)    Leukocytes, UA Large (3+) (*)    All other components within normal limits  URINE CULTURE    EKG   Radiology No results found.  Procedures Procedures (including critical care time)  Medications Ordered in UC Medications - No data to display  Initial Impression / Assessment and Plan / UC Course  I have reviewed the triage vital signs and the nursing notes.  Pertinent labs & imaging results that were available during my care of the patient were reviewed by me and considered in my medical decision making (see chart for details).    Plan: 1.  Cystitis will be treated with the following: A.  Macrobid every 12 hours to treat the bladder infection. B.  Pyridium every 6 hours to treat the burning on urination. C.  Urine culture is pending. 2.  Advised to follow-up with PCP return to urgent care if symptoms fail to improve. Final Clinical Impressions(s) / UC Diagnoses   Final diagnoses:  Cystitis     Discharge Instructions      Advised to increase fluid intake to help flush the urinary system. Advised to take the Pyridium every 6 hours to help decrease the burning and discomfort, this will turn your urine a different color. Advised take the Macrobid every 12 hours until completed to treat the cystitis. Advised to follow-up with PCP or return to urgent care if symptoms fail to improve.     ED Prescriptions     Medication Sig Dispense Auth. Provider   nitrofurantoin, macrocrystal-monohydrate, (MACROBID) 100 MG capsule Take 1 capsule (100 mg total) by mouth 2 (two) times daily. 10 capsule Nyoka Lint, PA-C   phenazopyridine (PYRIDIUM) 200 MG tablet Take 1 tablet (200 mg total) by mouth 3 (three) times daily. 6 tablet Nyoka Lint, PA-C      PDMP not reviewed this encounter.   Nyoka Lint, PA-C 07/16/22 1047

## 2022-07-16 NOTE — ED Triage Notes (Signed)
Pt c/o abd cramping, back pain, frequency and dysuria Onset ~ 3 days ago. Started AZO yesterday. Denies pain.

## 2022-07-16 NOTE — Discharge Instructions (Addendum)
Advised to increase fluid intake to help flush the urinary system. Advised to take the Pyridium every 6 hours to help decrease the burning and discomfort, this will turn your urine a different color. Advised take the Macrobid every 12 hours until completed to treat the cystitis. Advised to follow-up with PCP or return to urgent care if symptoms fail to improve.

## 2022-07-18 LAB — URINE CULTURE: Culture: >=100000 — AB

## 2022-07-30 ENCOUNTER — Encounter: Payer: Self-pay | Admitting: Emergency Medicine

## 2022-07-30 ENCOUNTER — Ambulatory Visit
Admission: EM | Admit: 2022-07-30 | Discharge: 2022-07-30 | Disposition: A | Discharge status: Home / Self Care | Payer: Medicaid Other | Attending: Internal Medicine | Admitting: Internal Medicine

## 2022-07-30 DIAGNOSIS — J029 Acute pharyngitis, unspecified: Secondary | ICD-10-CM | POA: Diagnosis not present

## 2022-07-30 DIAGNOSIS — J039 Acute tonsillitis, unspecified: Secondary | ICD-10-CM

## 2022-07-30 DIAGNOSIS — R051 Acute cough: Secondary | ICD-10-CM | POA: Diagnosis not present

## 2022-07-30 DIAGNOSIS — Z1152 Encounter for screening for COVID-19: Secondary | ICD-10-CM | POA: Insufficient documentation

## 2022-07-30 LAB — SARS CORONAVIRUS 2 (TAT 6-24 HRS): SARS Coronavirus 2: NEGATIVE

## 2022-07-30 LAB — POCT RAPID STREP A (OFFICE): Rapid Strep A Screen: NEGATIVE

## 2022-07-30 MED ORDER — AMOXICILLIN-POT CLAVULANATE 875-125 MG PO TABS: 1.0000 | ORAL_TABLET | Freq: Two times a day (BID) | ORAL | 0 refills | Status: DC

## 2022-07-30 NOTE — ED Provider Notes (Signed)
[facility]    CSN: [id number] Arrival date & time: 07/30/22  0810      History   Chief Complaint Chief Complaint  Patient presents with   Sore Throat    HPI Holly Meyer is a 26 y.o. female.   Patient presents with sore throat, bilateral enlarged tonsils, nasal congestion, mild nonproductive cough that started about 3 days ago.  Denies any known fevers or sick contacts.  Patient has taken ibuprofen and Tylenol with minimal improvement in symptoms.  Denies chest pain, shortness of breath, nausea, vomiting, diarrhea, abdominal pain.   Sore Throat    Past Medical History:  Diagnosis Date   Anemia    Complication of anesthesia    complaints of nausea and vomitting when wisdom teeth were removed   FHx: diabetes mellitus    Headache    Septate uterus    Venous malformation    Right paraspinal    Patient Active Problem List   Diagnosis Date Noted   Term pregnancy, repeat 01/15/2022   Fall 09/24/2021   Patient desires vaginal birth after cesarean section (VBAC) 08/15/2021   Abnormal fetal lie, antepartum 03/25/2018   History of miscarriage, currently pregnant, first trimester 08/28/2017   Rh negative state in antepartum period 08/28/2017   Preventative health care 02/01/2017   Allergic rhinitis 02/28/2016   Headache, migraine 08/02/2015   Back pain 04/28/2015   Venous malformation 02/28/2012    Past Surgical History:  Procedure Laterality Date   CESAREAN SECTION N/A 03/25/2018   Procedure: PRIMARY CESAREAN SECTION;  Surgeon: Harlin Heys, MD;  Location: [facility];  Service: Obstetrics;  Laterality: N/A;  Female born @ [place] N/A 01/15/2022   Procedure: CESAREAN SECTION;  Surgeon: Harlin Heys, MD;  Location: [facility];  Service: Obstetrics;  Laterality: N/A;   DILATION AND EVACUATION N/A 01/07/2017   Procedure: DILATATION AND EVACUATION;  Surgeon: Harlin Heys, MD;  Location: [facility];  Service: Gynecology;   Laterality: N/A;   WISDOM TOOTH EXTRACTION  2012    OB History     Gravida  3   Para  2   Term  2   Preterm  0   AB  1   Living  2      SAB  1   IAB  0   Ectopic  0   Multiple  0   Live Births  2            Home Medications    Prior to Admission medications   Medication Sig Start Date End Date Taking? Authorizing Provider  amoxicillin-clavulanate (AUGMENTIN) 875-125 MG tablet Take 1 tablet by mouth every 12 (twelve) hours. 07/30/22  Yes , Hildred Alamin E, FNP  acetaminophen (TYLENOL) 500 MG tablet Take 500 mg by mouth every 6 (six) hours as needed for moderate pain or headache.     [provider]  desogestrel-ethinyl estradiol (MIRCETTE) 0.15-0.02/0.01 MG (21/5) tablet Take 1 tablet by mouth at bedtime. 03/21/22   Harlin Heys, MD  fluticasone (FLONASE) 50 MCG/ACT nasal spray Place 1 spray into both nostrils daily as needed for allergies or rhinitis.    [provider]  ibuprofen (ADVIL) 600 MG tablet Take 1 tablet (600 mg total) by mouth every 6 (six) hours. 01/18/22   Harlin Heys, MD  nitrofurantoin, macrocrystal-monohydrate, (MACROBID) 100 MG capsule Take 1 capsule (100 mg total) by mouth 2 (two) times daily. 07/16/22   Nyoka Lint, PA-C  phenazopyridine (PYRIDIUM) 200 MG tablet Take 1 tablet (200 mg total) by mouth 3 (three) times daily. 07/16/22   Nyoka Lint, PA-C  sertraline (ZOLOFT) 50 MG tablet Take 1 tablet (50 mg total) by mouth daily. 04/10/22   Harlin Heys, MD    Family History Family History  Problem Relation Age of Onset   Migraines Mother    Diabetes Mother    Hypertension Father    Diabetes Father    Alzheimer's disease Maternal Grandmother    Diabetes Paternal Grandmother        family history   Heart failure Paternal Grandfather    Diabetes Paternal Grandfather     Social History Social History   Tobacco Use   Smoking status: Former    Packs/day: 0.25    Types: Cigarettes   Smokeless tobacco:  Never   Tobacco comments:    recently quit  Vaping Use   Vaping Use: Never used  Substance Use Topics   Alcohol use: Not Currently    Alcohol/week: 0.0 standard drinks of alcohol    Comment: socially   Drug use: No     Allergies   Fexofenadine, Imitrex [sumatriptan], Guaifenesin, Guaifenesin er, Meloxicam, Naproxen, and Tomato   Review of Systems Review of Systems Per HPI  Physical Exam Triage Vital Signs ED Triage Vitals  Enc Vitals Group     BP 07/30/22 0822 (!) 131/90     Pulse Rate 07/30/22 0822 80     Resp 07/30/22 0822 18     Temp 07/30/22 0822 98.3 F (36.8 C)     Temp src --      SpO2 07/30/22 0822 96 %     Weight --      Height --      Head Circumference --      Peak Flow --      Pain Score 07/30/22 0821 8     Pain Loc --      Pain Edu? --      Excl. in [place]? --    No data found.  Updated Vital Signs BP (!) 131/90   Pulse 80   Temp 98.3 F (36.8 C)   Resp 18   SpO2 96%   Breastfeeding No   Visual Acuity Right Eye Distance:   Left Eye Distance:   Bilateral Distance:    Right Eye Near:   Left Eye Near:    Bilateral Near:     Physical Exam Constitutional:      General: She is not in acute distress.    Appearance: Normal appearance. She is not toxic-appearing or diaphoretic.  HENT:     Head: Normocephalic and atraumatic.     Right Ear: Tympanic membrane and ear canal normal.     Left Ear: Tympanic membrane and ear canal normal.     Nose: Congestion present.     Mouth/Throat:     Mouth: Mucous membranes are moist.     Pharynx: Posterior oropharyngeal erythema present. No pharyngeal swelling, oropharyngeal exudate or uvula swelling.     Tonsils: No tonsillar exudate or tonsillar abscesses. 2+ on the right. 2+ on the left.  Eyes:     Extraocular Movements: Extraocular movements intact.     Conjunctiva/sclera: Conjunctivae normal.     Pupils: Pupils are equal, round, and reactive to light.  Cardiovascular:     Rate and Rhythm: Normal rate  and regular rhythm.     Pulses: Normal pulses.     Heart sounds: Normal heart sounds.  Pulmonary:     Effort: Pulmonary effort is normal. No respiratory distress.     Breath sounds: Normal breath sounds. No wheezing.  Abdominal:     General: Abdomen is flat. Bowel sounds are normal.     Palpations: Abdomen is soft.  Musculoskeletal:        General: Normal range of motion.     Cervical back: Normal range of motion.  Skin:    General: Skin is warm and dry.  Neurological:     General: No focal deficit present.     Mental Status: She is alert and oriented to person, place, and time. Mental status is at baseline.  Psychiatric:        Mood and Affect: Mood normal.        Behavior: Behavior normal.      UC Treatments / Results  Labs (all labs ordered are listed, but only abnormal results are displayed) Labs Reviewed  CULTURE, GROUP A STREP ([place])  SARS CORONAVIRUS 2 (TAT 6-24 HRS)  POCT RAPID STREP A (OFFICE)    EKG   Radiology No results found.  Procedures Procedures (including critical care time)  Medications Ordered in UC Medications - No data to display  Initial Impression / Assessment and Plan / UC Course  I have reviewed the triage vital signs and the nursing notes.  Pertinent labs & imaging results that were available during my care of the patient were reviewed by me and considered in my medical decision making (see chart for details).     Rapid strep is negative.  Throat culture is pending.  Still suspicious of strep throat given appearance of tonsils and acute tonsillitis on exam.  No signs of peritonsillar abscess.  Will opt to treat with Augmentin antibiotic.  Advised patient that viral illness could also be causing symptoms and discussed supportive care and symptom management.  COVID test is pending.  Discussed return and ER precautions.  Patient verbalized understanding and was agreeable with plan. Final Clinical Impressions(s) / UC Diagnoses   Final  diagnoses:  Acute tonsillitis, unspecified etiology  Sore throat  Acute cough     Discharge Instructions      Strep test was negative but I am still suspicious of strep throat given appearance of your throat on exam so you are being treated with Augmentin antibiotic.  Please take this medication with food.  COVID test is pending.  We will call if it is positive.  Please follow-up if symptoms persist or worsen.    ED Prescriptions     Medication Sig Dispense Auth. Provider   amoxicillin-clavulanate (AUGMENTIN) 875-125 MG tablet Take 1 tablet by mouth every 12 (twelve) hours. 14 tablet [place], Michele Rockers, [place]      PDMP not reviewed this encounter.   Teodora Medici, [place] 07/30/22 1001

## 2022-07-30 NOTE — ED Triage Notes (Signed)
Pt is present today with possible strep throat. Pt states that her tonsil have been swollen for the last x3 days ago

## 2022-07-30 NOTE — Discharge Instructions (Signed)
Strep test was negative but I am still suspicious of strep throat given appearance of your throat on exam so you are being treated with Augmentin antibiotic.  Please take this medication with food.  COVID test is pending.  We will call if it is positive.  Please follow-up if symptoms persist or worsen.

## 2022-08-01 LAB — CULTURE, GROUP A STREP (THRC)

## 2022-08-14 ENCOUNTER — Ambulatory Visit
Admission: EM | Admit: 2022-08-14 | Discharge: 2022-08-14 | Disposition: A | Discharge status: Home / Self Care | Payer: Medicaid Other | Attending: Emergency Medicine | Admitting: Emergency Medicine

## 2022-08-14 DIAGNOSIS — R300 Dysuria: Secondary | ICD-10-CM | POA: Diagnosis not present

## 2022-08-14 DIAGNOSIS — R1030 Lower abdominal pain, unspecified: Secondary | ICD-10-CM | POA: Diagnosis present

## 2022-08-14 DIAGNOSIS — M545 Low back pain, unspecified: Secondary | ICD-10-CM | POA: Insufficient documentation

## 2022-08-14 LAB — POCT URINALYSIS DIP (MANUAL ENTRY)
Bilirubin, UA: NEGATIVE
Glucose, UA: NEGATIVE mg/dL
Ketones, POC UA: NEGATIVE mg/dL
Nitrite, UA: NEGATIVE
Protein Ur, POC: NEGATIVE mg/dL
Spec Grav, UA: 1.02 (ref 1.010–1.025)
Urobilinogen, UA: 0.2 E.U./dL
pH, UA: 6 (ref 5.0–8.0)

## 2022-08-14 LAB — POCT URINE PREGNANCY: Preg Test, Ur: NEGATIVE

## 2022-08-14 NOTE — ED Triage Notes (Signed)
Patient to Urgent Care with complaints of dysuria and urinary frequency that started yesterday. Denies any abnormal vaginal discharge. Denies any fevers.

## 2022-08-14 NOTE — Discharge Instructions (Addendum)
The urine culture is pending.  We will call you if it shows the need for treatment.  Follow up with your primary care provider if your symptoms are not improving.     

## 2022-08-14 NOTE — ED Provider Notes (Signed)
Holly Meyer    CSN: [id number] Arrival date & time: 08/14/22  3295      History   Chief Complaint Chief Complaint  Patient presents with   Dysuria    HPI KEYARIA Meyer is a 26 y.o. female.  Patient presents with dysuria, urinary frequency, lower abdominal discomfort, low back pain x1 day.  No fever, chills, vomiting, diarrhea, vaginal discharge, pelvic pain, or other symptoms.  No treatments at home.  Patient was seen at [facility] urgent care on 07/16/2022; diagnosed with cystitis; treated with Macrobid and phenazopyridine; urine culture >100,000 E.coli and sensitive to Macrobid.  She was seen again at [facility] urgent care on 07/30/2022; diagnosed tonsillitis, sore throat, cough; treated with Augmentin.     The history is provided by the patient and medical records.    Past Medical History:  Diagnosis Date   Anemia    Complication of anesthesia    complaints of nausea and vomitting when wisdom teeth were removed   FHx: diabetes mellitus    Headache    Septate uterus    Venous malformation    Right paraspinal    Patient Active Problem List   Diagnosis Date Noted   Term pregnancy, repeat 01/15/2022   Fall 09/24/2021   Patient desires vaginal birth after cesarean section (VBAC) 08/15/2021   Abnormal fetal lie, antepartum 03/25/2018   History of miscarriage, currently pregnant, first trimester 08/28/2017   Rh negative state in antepartum period 08/28/2017   Preventative health care 02/01/2017   Allergic rhinitis 02/28/2016   Headache, migraine 08/02/2015   Back pain 04/28/2015   Venous malformation 02/28/2012    Past Surgical History:  Procedure Laterality Date   CESAREAN SECTION N/A 03/25/2018   Procedure: PRIMARY CESAREAN SECTION;  Surgeon: Harlin Heys, MD;  Location: [facility];  Service: Obstetrics;  Laterality: N/A;  Female born @ [place] N/A 01/15/2022   Procedure: CESAREAN SECTION;  Surgeon: Harlin Heys, MD;  Location:  [facility];  Service: Obstetrics;  Laterality: N/A;   DILATION AND EVACUATION N/A 01/07/2017   Procedure: DILATATION AND EVACUATION;  Surgeon: Harlin Heys, MD;  Location: [facility];  Service: Gynecology;  Laterality: N/A;   WISDOM TOOTH EXTRACTION  2012    OB History     Gravida  3   Para  2   Term  2   Preterm  0   AB  1   Living  2      SAB  1   IAB  0   Ectopic  0   Multiple  0   Live Births  2            Home Medications    Prior to Admission medications   Medication Sig Start Date End Date Taking? Authorizing Provider  acetaminophen (TYLENOL) 500 MG tablet Take 500 mg by mouth every 6 (six) hours as needed for moderate pain or headache.     [provider]  amoxicillin-clavulanate (AUGMENTIN) 875-125 MG tablet Take 1 tablet by mouth every 12 (twelve) hours. 07/30/22   Teodora Medici, FNP  desogestrel-ethinyl estradiol (MIRCETTE) 0.15-0.02/0.01 MG (21/5) tablet Take 1 tablet by mouth at bedtime. 03/21/22   Harlin Heys, MD  fluticasone (FLONASE) 50 MCG/ACT nasal spray Place 1 spray into both nostrils daily as needed for allergies or rhinitis.    [provider]  ibuprofen (ADVIL) 600 MG tablet Take 1 tablet (600 mg total) by mouth every 6 (six)  hours. 01/18/22   Harlin Heys, MD  nitrofurantoin, macrocrystal-monohydrate, (MACROBID) 100 MG capsule Take 1 capsule (100 mg total) by mouth 2 (two) times daily. 07/16/22   Nyoka Lint, PA-C  phenazopyridine (PYRIDIUM) 200 MG tablet Take 1 tablet (200 mg total) by mouth 3 (three) times daily. 07/16/22   Nyoka Lint, PA-C  sertraline (ZOLOFT) 50 MG tablet Take 1 tablet (50 mg total) by mouth daily. 04/10/22   Harlin Heys, MD    Family History Family History  Problem Relation Age of Onset   Migraines Mother    Diabetes Mother    Hypertension Father    Diabetes Father    Alzheimer's disease Maternal Grandmother    Diabetes Paternal Grandmother        family history   Heart  failure Paternal Grandfather    Diabetes Paternal Grandfather     Social History Social History   Tobacco Use   Smoking status: Former    Packs/day: 0.25    Types: Cigarettes   Smokeless tobacco: Never   Tobacco comments:    recently quit  Vaping Use   Vaping Use: Never used  Substance Use Topics   Alcohol use: Not Currently    Alcohol/week: 0.0 standard drinks of alcohol    Comment: socially   Drug use: No     Allergies   Fexofenadine, Imitrex [sumatriptan], Guaifenesin, Guaifenesin er, Meloxicam, Naproxen, and Tomato   Review of Systems Review of Systems  Constitutional:  Negative for chills and fever.  Gastrointestinal:  Positive for abdominal pain. Negative for diarrhea and vomiting.  Genitourinary:  Positive for dysuria and frequency. Negative for hematuria, pelvic pain and vaginal discharge.  Musculoskeletal:  Positive for back pain.  All other systems reviewed and are negative.    Physical Exam Triage Vital Signs ED Triage Vitals [08/14/22 0958]  Enc Vitals Group     BP      Pulse      Resp      Temp      Temp src      SpO2      Weight 170 lb (77.1 kg)     Height '5\' 7"'$  (1.702 m)     Head Circumference      Peak Flow      Pain Score 4     Pain Loc      Pain Edu?      Excl. in [place]?    No data found.  Updated Vital Signs BP 124/81   Pulse 62   Temp 98.5 F (36.9 C)   Resp 18   Ht '5\' 7"'$  (1.702 m)   Wt 170 lb (77.1 kg)   LMP 07/14/2022   SpO2 98%   BMI 26.63 kg/m   Visual Acuity Right Eye Distance:   Left Eye Distance:   Bilateral Distance:    Right Eye Near:   Left Eye Near:    Bilateral Near:     Physical Exam Vitals and nursing note reviewed.  Constitutional:      General: She is not in acute distress.    Appearance: Normal appearance. She is well-developed. She is not ill-appearing.  HENT:     Mouth/Throat:     Mouth: Mucous membranes are moist.  Cardiovascular:     Rate and Rhythm: Normal rate and regular rhythm.      Heart sounds: Normal heart sounds.  Pulmonary:     Effort: Pulmonary effort is normal. No respiratory distress.     Breath sounds: Normal  breath sounds.  Abdominal:     General: Bowel sounds are normal.     Palpations: Abdomen is soft.     Tenderness: There is no abdominal tenderness. There is no right CVA tenderness, left CVA tenderness, guarding or rebound.  Musculoskeletal:     Cervical back: Neck supple.  Skin:    General: Skin is warm and dry.  Neurological:     Mental Status: She is alert.  Psychiatric:        Mood and Affect: Mood normal.        Behavior: Behavior normal.      UC Treatments / Results  Labs (all labs ordered are listed, but only abnormal results are displayed) Labs Reviewed  POCT URINALYSIS DIP (MANUAL ENTRY) - Abnormal; Notable for the following components:      Result Value   Blood, UA small (*)    Leukocytes, UA Small (1+) (*)    All other components within normal limits  URINE CULTURE  POCT URINE PREGNANCY    EKG   Radiology No results found.  Procedures Procedures (including critical care time)  Medications Ordered in UC Medications - No data to display  Initial Impression / Assessment and Plan / UC Course  I have reviewed the triage vital signs and the nursing notes.  Pertinent labs & imaging results that were available during my care of the patient were reviewed by me and considered in my medical decision making (see chart for details).   Dysuria, lower abdominal pain, low back pain.  Afebrile, vital signs are stable.  Patient is well-appearing and her exam is reassuring.  Urine pregnancy negative, urine culture pending.  Patient has been treated with antibiotics twice this month.  No antibiotic prescribed today.  Discussed that we will let her know if the urine culture shows the need for treatment.  Patient declines vaginal testing.  Instructed patient to follow up with her PCP if her symptoms are not improving.  Education provided on  dysuria, abdominal pain, back pain.  She agrees to plan of care.     Final Clinical Impressions(s) / UC Diagnoses   Final diagnoses:  Dysuria  Lower abdominal pain  Bilateral low back pain without sciatica, unspecified chronicity     Discharge Instructions      The urine culture is pending.  We will call you if it shows the need for treatment.  Follow up with your primary care provider if your symptoms are not improving.           ED Prescriptions   None    PDMP not reviewed this encounter.   Sharion Balloon, NP 08/14/22 1029

## 2022-08-15 ENCOUNTER — Other Ambulatory Visit: Payer: Self-pay | Admitting: Obstetrics

## 2022-08-15 MED ORDER — PHENAZOPYRIDINE HCL 200 MG PO TABS: 200.0000 mg | ORAL_TABLET | Freq: Three times a day (TID) | ORAL | 0 refills | Status: DC

## 2022-08-15 MED ORDER — SULFAMETHOXAZOLE-TRIMETHOPRIM 800-160 MG PO TABS: 1.0000 | ORAL_TABLET | Freq: Every day | ORAL | 0 refills | Status: DC

## 2022-08-16 ENCOUNTER — Telehealth (HOSPITAL_COMMUNITY): Payer: Self-pay | Admitting: Emergency Medicine

## 2022-08-16 LAB — URINE CULTURE: Culture: 20000 — AB

## 2022-08-16 NOTE — Telephone Encounter (Signed)
Opened in error

## 2022-09-04 ENCOUNTER — Ambulatory Visit: Payer: Medicaid Other | Admitting: Nurse Practitioner

## 2022-09-08 ENCOUNTER — Other Ambulatory Visit: Payer: Self-pay | Admitting: Obstetrics and Gynecology

## 2022-10-05 ENCOUNTER — Encounter: Payer: Self-pay | Admitting: Family Medicine

## 2022-10-05 ENCOUNTER — Ambulatory Visit (INDEPENDENT_AMBULATORY_CARE_PROVIDER_SITE_OTHER): Payer: Medicaid Other | Admitting: Family Medicine

## 2022-10-05 VITALS — BP 122/88 | HR 80 | Temp 98.1°F | Resp 16 | Wt 177.6 lb

## 2022-10-05 DIAGNOSIS — Z23 Encounter for immunization: Secondary | ICD-10-CM | POA: Diagnosis not present

## 2022-10-05 DIAGNOSIS — F411 Generalized anxiety disorder: Secondary | ICD-10-CM

## 2022-10-05 DIAGNOSIS — Z7689 Persons encountering health services in other specified circumstances: Secondary | ICD-10-CM

## 2022-10-05 MED ORDER — SERTRALINE HCL 100 MG PO TABS: 100.0000 mg | ORAL_TABLET | Freq: Every day | ORAL | 1 refills | Status: DC

## 2022-10-05 NOTE — Progress Notes (Signed)
Patient is here to established care with provider today. Patient has many health concern they would like to discuss with provider today  Care gaps discuss at appointment today  

## 2022-10-05 NOTE — Progress Notes (Signed)
New Patient Office Visit  Subjective    Patient ID: Holly Meyer, female    DOB: January 02, 1996  Age: 26 y.o. MRN: [id number]  CC:  Chief Complaint  Patient presents with   Establish Care    HPI Holly Meyer presents to establish care and for complaint of persistent anxiety since birth of her second child about 6 months ago. She has been on zoloft 50 mg but feels breakthrough anxiety.    Outpatient Encounter Medications as of 10/05/2022  Medication Sig   acetaminophen (TYLENOL) 500 MG tablet Take 500 mg by mouth every 6 (six) hours as needed for moderate pain or headache.    amoxicillin-clavulanate (AUGMENTIN) 875-125 MG tablet Take 1 tablet by mouth every 12 (twelve) hours.   ibuprofen (ADVIL) 600 MG tablet Take 1 tablet (600 mg total) by mouth every 6 (six) hours.   KARIVA 0.15-0.02/0.01 MG (21/5) tablet TAKE 1 TABLET BY MOUTH EVERYDAY AT BEDTIME   sertraline (ZOLOFT) 100 MG tablet Take 1 tablet (100 mg total) by mouth daily.   sertraline (ZOLOFT) 50 MG tablet Take 1 tablet (50 mg total) by mouth daily.   sulfamethoxazole-trimethoprim (BACTRIM DS) 800-160 MG tablet Take 1 tablet by mouth daily. (Patient not taking: Reported on 10/05/2022)   fluticasone (FLONASE) 50 MCG/ACT nasal spray Place 1 spray into both nostrils daily as needed for allergies or rhinitis. (Patient not taking: Reported on 10/05/2022)   nitrofurantoin, macrocrystal-monohydrate, (MACROBID) 100 MG capsule Take 1 capsule (100 mg total) by mouth 2 (two) times daily. (Patient not taking: Reported on 10/05/2022)   phenazopyridine (PYRIDIUM) 200 MG tablet Take 1 tablet (200 mg total) by mouth 3 (three) times daily. (Patient not taking: Reported on 10/05/2022)   No facility-administered encounter medications on file as of 10/05/2022.    Past Medical History:  Diagnosis Date   Anemia    Complication of anesthesia    complaints of nausea and vomitting when wisdom teeth were removed   FHx: diabetes mellitus     Headache    Septate uterus    Venous malformation    Right paraspinal    Past Surgical History:  Procedure Laterality Date   CESAREAN SECTION N/A 03/25/2018   Procedure: PRIMARY CESAREAN SECTION;  Surgeon: Harlin Heys, MD;  Location: [facility];  Service: Obstetrics;  Laterality: N/A;  Female born @ [place] N/A 01/15/2022   Procedure: CESAREAN SECTION;  Surgeon: Harlin Heys, MD;  Location: [facility];  Service: Obstetrics;  Laterality: N/A;   DILATION AND EVACUATION N/A 01/07/2017   Procedure: DILATATION AND EVACUATION;  Surgeon: Harlin Heys, MD;  Location: [facility];  Service: Gynecology;  Laterality: N/A;   WISDOM TOOTH EXTRACTION  2012    Family History  Problem Relation Age of Onset   Migraines Mother    Diabetes Mother    Hypertension Father    Diabetes Father    Alzheimer's disease Maternal Grandmother    Diabetes Paternal Grandmother        family history   Heart failure Paternal Grandfather    Diabetes Paternal Grandfather     Social History   Socioeconomic History   Marital status: Married    Spouse name: Lytle Michaels   Number of children: 1   Years of education: College   Highest education level: Associate degree: academic program  Occupational History   Not on file  Tobacco Use   Smoking status: Former    Packs/day: 0.25    Types:  Cigarettes   Smokeless tobacco: Never   Tobacco comments:    recently quit  Vaping Use   Vaping Use: Never used  Substance and Sexual Activity   Alcohol use: Not Currently    Alcohol/week: 0.0 standard drinks of alcohol    Comment: socially   Drug use: No   Sexual activity: Yes    Partners: Male    Birth control/protection: Pill  Other Topics Concern   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   Works at [employer].    Enjoys swimming, watching movies.   Social Determinants of  Health   Financial Resource Strain: Not on file  Food Insecurity: Not on file  Transportation Needs: Not on file  Physical Activity: Not on file  Stress: Not on file  Social Connections: Not on file  Intimate Partner Violence: Not on file    Review of Systems  Psychiatric/Behavioral:  Negative for depression and suicidal ideas. The patient is nervous/anxious.   All other systems reviewed and are negative.       Objective    BP 122/88   Pulse 80   Temp 98.1 F (36.7 C) (Oral)   Resp 16   Wt 177 lb 9.6 oz (80.6 kg)   SpO2 98%   BMI 27.82 kg/m   Physical Exam Vitals and nursing note reviewed.  Constitutional:      General: She is not in acute distress. Cardiovascular:     Rate and Rhythm: Normal rate and regular rhythm.  Pulmonary:     Effort: Pulmonary effort is normal.     Breath sounds: Normal breath sounds.  Neurological:     General: No focal deficit present.     Mental Status: She is alert and oriented to person, place, and time.  Psychiatric:        Mood and Affect: Mood normal.        Behavior: Behavior normal.         Assessment & Plan:   1. Anxiety state Will increase zoloft from 50 mg to '100mg'$  and monitor  2. Encounter to establish care   3. Need for immunization against influenza  - Flu Vaccine QUAD 34moIM (Fluarix, Fluzone & Alfiuria Quad PF)    Return in about 4 weeks (around 11/02/2022) for follow up, physical.   WBecky Sax MD

## 2022-10-27 ENCOUNTER — Other Ambulatory Visit: Payer: Self-pay | Admitting: Family Medicine

## 2022-11-01 ENCOUNTER — Encounter: Payer: Self-pay | Admitting: Family Medicine

## 2022-11-02 ENCOUNTER — Ambulatory Visit
Admission: EM | Admit: 2022-11-02 | Discharge: 2022-11-02 | Disposition: A | Discharge status: Home / Self Care | Payer: Medicaid Other | Attending: Physician Assistant | Admitting: Physician Assistant

## 2022-11-02 DIAGNOSIS — J029 Acute pharyngitis, unspecified: Secondary | ICD-10-CM | POA: Diagnosis not present

## 2022-11-02 DIAGNOSIS — J0100 Acute maxillary sinusitis, unspecified: Secondary | ICD-10-CM

## 2022-11-02 LAB — POCT RAPID STREP A (OFFICE): Rapid Strep A Screen: NEGATIVE

## 2022-11-02 LAB — POCT MONO SCREEN (KUC): Mono, POC: NEGATIVE

## 2022-11-02 MED ORDER — PREDNISONE 20 MG PO TABS: 40.0000 mg | ORAL_TABLET | Freq: Every day | ORAL | 0 refills | Status: AC

## 2022-11-02 MED ORDER — AMOXICILLIN-POT CLAVULANATE 875-125 MG PO TABS: 1.0000 | ORAL_TABLET | Freq: Two times a day (BID) | ORAL | 0 refills | Status: DC

## 2022-11-02 NOTE — ED Provider Notes (Signed)
[name]    CSN: [id number] Arrival date & time: 11/02/22  5364      History   Chief Complaint Chief Complaint  Patient presents with   Sore Throat   Nasal Congestion    HPI Holly Meyer is a 27 y.o. female.   Patient here today for evaluation of sore throat, PND, neck pain and congestion that started 2 weeks ago. She reports yesterday she noticed white patches in her throat. She has been using bayer back and body for neck pain. She has not had fever. She denies any vomiting or diarrhea.   The history is provided by the patient.    Past Medical History:  Diagnosis Date   Anemia    Complication of anesthesia    complaints of nausea and vomitting when wisdom teeth were removed   FHx: diabetes mellitus    Headache    Septate uterus    Venous malformation    Right paraspinal    Patient Active Problem List   Diagnosis Date Noted   Term pregnancy, repeat 01/15/2022   Fall 09/24/2021   Patient desires vaginal birth after cesarean section (VBAC) 08/15/2021   Abnormal fetal lie, antepartum 03/25/2018   History of miscarriage, currently pregnant, first trimester 08/28/2017   Rh negative state in antepartum period 08/28/2017   Preventative health care 02/01/2017   Allergic rhinitis 02/28/2016   Headache, migraine 08/02/2015   Back pain 04/28/2015   Venous malformation 02/28/2012    Past Surgical History:  Procedure Laterality Date   CESAREAN SECTION N/A 03/25/2018   Procedure: PRIMARY CESAREAN SECTION;  Surgeon: Harlin Heys, MD;  Location: [facility] OR[facility];  Service: Obstetrics;  Laterality: N/A;  Female born @ [place] N/A 01/15/2022   Procedure: CESAREAN SECTION;  Surgeon: Harlin Heys, MD;  Location: [facility];  Service: Obstetrics;  Laterality: N/A;   DILATION AND EVACUATION N/A 01/07/2017   Procedure: DILATATION AND EVACUATION;  Surgeon: Harlin Heys, MD;  Location: [facility];  Service: Gynecology;  Laterality: N/A;    WISDOM TOOTH EXTRACTION  2012    OB History     Gravida  3   Para  2   Term  2   Preterm  0   AB  1   Living  2      SAB  1   IAB  0   Ectopic  0   Multiple  0   Live Births  2            Home Medications    Prior to Admission medications   Medication Sig Start Date End Date Taking? Authorizing Provider  amoxicillin-clavulanate (AUGMENTIN) 875-125 MG tablet Take 1 tablet by mouth every 12 (twelve) hours. 11/02/22  Yes Francene Finders, PA-C  predniSONE (DELTASONE) 20 MG tablet Take 2 tablets (40 mg total) by mouth daily with breakfast for 5 days. 11/02/22 11/07/22 Yes Francene Finders, PA-C  acetaminophen (TYLENOL) 500 MG tablet Take 500 mg by mouth every 6 (six) hours as needed for moderate pain or headache.     [provider]  ibuprofen (ADVIL) 600 MG tablet Take 1 tablet (600 mg total) by mouth every 6 (six) hours. 01/18/22   Harlin Heys, MD  KARIVA 0.15-0.02/0.01 MG (21/5) tablet TAKE 1 TABLET BY MOUTH EVERYDAY AT BEDTIME 09/11/22   Harlin Heys, MD  sertraline (ZOLOFT) 100 MG tablet TAKE 1 TABLET BY MOUTH EVERY DAY 10/29/22   Dorna Mai,  MD  sertraline (ZOLOFT) 50 MG tablet Take 1 tablet (50 mg total) by mouth daily. 04/10/22   Harlin Heys, MD    Family History Family History  Problem Relation Age of Onset   Migraines Mother    Diabetes Mother    Hypertension Father    Diabetes Father    Alzheimer's disease Maternal Grandmother    Diabetes Paternal Grandmother        family history   Heart failure Paternal Grandfather    Diabetes Paternal Grandfather     Social History Social History   Tobacco Use   Smoking status: Former    Packs/day: 0.25    Types: Cigarettes   Smokeless tobacco: Never   Tobacco comments:    recently quit  Vaping Use   Vaping Use: Never used  Substance Use Topics   Alcohol use: Not Currently    Alcohol/week: 0.0 standard drinks of alcohol    Comment: socially   Drug use: No      Allergies   Fexofenadine, Imitrex [sumatriptan], Guaifenesin, Guaifenesin er, Meloxicam, Naproxen, and Tomato   Review of Systems Review of Systems  Constitutional:  Negative for chills and fever.  HENT:  Positive for congestion, sinus pressure and sore throat. Negative for ear pain.   Eyes:  Negative for discharge and redness.  Respiratory:  Negative for cough, shortness of breath and wheezing.   Gastrointestinal:  Negative for diarrhea, nausea and vomiting.  Musculoskeletal:  Positive for neck pain.     Physical Exam Triage Vital Signs ED Triage Vitals  Enc Vitals Group     BP      Pulse      Resp      Temp      Temp src      SpO2      Weight      Height      Head Circumference      Peak Flow      Pain Score      Pain Loc      Pain Edu?      Excl. in [place]?    No data found.  Updated Vital Signs BP (!) 133/95 (BP Location: Left Arm)   Pulse 87   Temp 97.7 F (36.5 C) (Temporal)   Resp 16   SpO2 98%   Breastfeeding No     Physical Exam Vitals and nursing note reviewed.  Constitutional:      General: She is not in acute distress.    Appearance: Normal appearance. She is not ill-appearing.  HENT:     Head: Normocephalic and atraumatic.     Nose: Congestion present.     Mouth/Throat:     Mouth: Mucous membranes are moist.     Pharynx: Posterior oropharyngeal erythema present. No oropharyngeal exudate.     Tonsils: 3+ on the right. 3+ on the left.  Eyes:     Conjunctiva/sclera: Conjunctivae normal.  Cardiovascular:     Rate and Rhythm: Normal rate and regular rhythm.     Heart sounds: Normal heart sounds. No murmur heard. Pulmonary:     Effort: Pulmonary effort is normal. No respiratory distress.     Breath sounds: Normal breath sounds. No wheezing, rhonchi or rales.  Skin:    General: Skin is warm and dry.  Neurological:     Mental Status: She is alert.  Psychiatric:        Mood and Affect: Mood normal.        Thought Content:  Thought content  normal.      UC Treatments / Results  Labs (all labs ordered are listed, but only abnormal results are displayed) Labs Reviewed  CULTURE, GROUP A STREP [facility])  POCT RAPID STREP A (OFFICE)  POCT MONO SCREEN [facility])    EKG   Radiology No results found.  Procedures Procedures (including critical care time)  Medications Ordered in UC Medications - No data to display  Initial Impression / Assessment and Plan / UC Course  I have reviewed the triage vital signs and the nursing notes.  Pertinent labs & imaging results that were available during my care of the patient were reviewed by me and considered in my medical decision making (see chart for details).    Strep and Mono screen negative in office. Given duration of congestion will treat to cover sinusitis with augmentin. Will also treat with steroid burst to cover tonsillitis. Encouraged follow up of no gradual improvement or with any further concerns. Patient expressed understanding.   Final Clinical Impressions(s) / UC Diagnoses   Final diagnoses:  Acute pharyngitis, unspecified etiology  Acute maxillary sinusitis, recurrence not specified   Discharge Instructions   None    ED Prescriptions     Medication Sig Dispense Auth. Provider   amoxicillin-clavulanate (AUGMENTIN) 875-125 MG tablet Take 1 tablet by mouth every 12 (twelve) hours. 14 tablet Ewell Poe F, PA-C   predniSONE (DELTASONE) 20 MG tablet Take 2 tablets (40 mg total) by mouth daily with breakfast for 5 days. 10 tablet Francene Finders, PA-C      PDMP not reviewed this encounter.   Francene Finders, PA-C 11/02/22 1023

## 2022-11-02 NOTE — ED Triage Notes (Signed)
Patient present to UC for sore throat, post nasal drip, neck pain since 2 weeks. Concerned since she noted white patches. Treating with bayer back and body.   Denies fever.

## 2022-11-05 LAB — CULTURE, GROUP A STREP (THRC)

## 2022-11-06 ENCOUNTER — Encounter: Payer: Self-pay | Admitting: Family Medicine

## 2022-11-06 ENCOUNTER — Ambulatory Visit (INDEPENDENT_AMBULATORY_CARE_PROVIDER_SITE_OTHER): Payer: Medicaid Other | Admitting: Family Medicine

## 2022-11-06 VITALS — BP 122/87 | HR 74 | Temp 98.0°F | Resp 16 | Wt 173.8 lb

## 2022-11-06 DIAGNOSIS — Z0000 Encounter for general adult medical examination without abnormal findings: Secondary | ICD-10-CM

## 2022-11-06 DIAGNOSIS — Z130 Encounter for screening for diseases of the blood and blood-forming organs and certain disorders involving the immune mechanism: Secondary | ICD-10-CM

## 2022-11-06 DIAGNOSIS — Z13220 Encounter for screening for lipoid disorders: Secondary | ICD-10-CM

## 2022-11-06 NOTE — Progress Notes (Unsigned)
Patient is here for complete physical examination  Patient c/o .abominal pain that wakes her at night from time to time.

## 2022-11-07 ENCOUNTER — Encounter: Payer: Self-pay | Admitting: Family Medicine

## 2022-11-07 LAB — LIPID PANEL
Chol/HDL Ratio: 3.8 ratio (ref 0.0–4.4)
Cholesterol, Total: 219 mg/dL — ABNORMAL HIGH (ref 100–199)
HDL: 57 mg/dL (ref >39)
LDL Chol Calc (NIH): 124 mg/dL — ABNORMAL HIGH (ref 0–99)
Triglycerides: 217 mg/dL — ABNORMAL HIGH (ref 0–149)
VLDL Cholesterol Cal: 38 mg/dL (ref 5–40)

## 2022-11-07 LAB — CBC WITH DIFFERENTIAL/PLATELET
Basophils Absolute: 0.1 10*3/uL (ref 0.0–0.2)
Basos: 0 %
EOS (ABSOLUTE): 0.1 10*3/uL (ref 0.0–0.4)
Eos: 1 %
Hematocrit: 41 % (ref 34.0–46.6)
Hemoglobin: 13.2 g/dL (ref 11.1–15.9)
Immature Grans (Abs): 0 10*3/uL (ref 0.0–0.1)
Immature Granulocytes: 0 %
Lymphocytes Absolute: 4.3 10*3/uL — ABNORMAL HIGH (ref 0.7–3.1)
Lymphs: 37 %
MCH: 28.1 pg (ref 26.6–33.0)
MCHC: 32.2 g/dL (ref 31.5–35.7)
MCV: 87 fL (ref 79–97)
Monocytes Absolute: 0.6 10*3/uL (ref 0.1–0.9)
Monocytes: 5 %
Neutrophils Absolute: 6.5 10*3/uL (ref 1.4–7.0)
Neutrophils: 57 %
Platelets: 327 10*3/uL (ref 150–450)
RBC: 4.69 x10E6/uL (ref 3.77–5.28)
RDW: 12.5 % (ref 11.7–15.4)
WBC: 11.6 10*3/uL — ABNORMAL HIGH (ref 3.4–10.8)

## 2022-11-07 LAB — CMP14+EGFR
ALT: 11 IU/L (ref 0–32)
AST: 11 IU/L (ref 0–40)
Albumin/Globulin Ratio: 1.9 (ref 1.2–2.2)
Albumin: 4.7 g/dL (ref 4.0–5.0)
Alkaline Phosphatase: 51 IU/L (ref 44–121)
BUN/Creatinine Ratio: 17 (ref 9–23)
BUN: 16 mg/dL (ref 6–20)
Bilirubin Total: 0.3 mg/dL (ref 0.0–1.2)
CO2: 20 mmol/L (ref 20–29)
Calcium: 9.3 mg/dL (ref 8.7–10.2)
Chloride: 102 mmol/L (ref 96–106)
Creatinine, Ser: 0.92 mg/dL (ref 0.57–1.00)
Globulin, Total: 2.5 g/dL (ref 1.5–4.5)
Glucose: 83 mg/dL (ref 70–99)
Potassium: 4.1 mmol/L (ref 3.5–5.2)
Sodium: 142 mmol/L (ref 134–144)
Total Protein: 7.2 g/dL (ref 6.0–8.5)
eGFR: 88 mL/min/{1.73_m2} (ref >59)

## 2022-11-07 NOTE — Progress Notes (Signed)
Established Patient Office Visit  Subjective    Patient ID: Holly Meyer, female    DOB: July 27, 1996  Age: 27 y.o. MRN: [id number]  CC:  Chief Complaint  Patient presents with   Annual Exam    HPI UNDREA ARCHBOLD presents for routine annual exam. Patient denies acute complaints or concerns.    Outpatient Encounter Medications as of 11/06/2022  Medication Sig   acetaminophen (TYLENOL) 500 MG tablet Take 500 mg by mouth every 6 (six) hours as needed for moderate pain or headache.    amoxicillin-clavulanate (AUGMENTIN) 875-125 MG tablet Take 1 tablet by mouth every 12 (twelve) hours.   ibuprofen (ADVIL) 600 MG tablet Take 1 tablet (600 mg total) by mouth every 6 (six) hours.   KARIVA 0.15-0.02/0.01 MG (21/5) tablet TAKE 1 TABLET BY MOUTH EVERYDAY AT BEDTIME   predniSONE (DELTASONE) 20 MG tablet Take 2 tablets (40 mg total) by mouth daily with breakfast for 5 days.   sertraline (ZOLOFT) 100 MG tablet TAKE 1 TABLET BY MOUTH EVERY DAY   sertraline (ZOLOFT) 50 MG tablet Take 1 tablet (50 mg total) by mouth daily. (Patient not taking: Reported on 11/06/2022)   No facility-administered encounter medications on file as of 11/06/2022.    Past Medical History:  Diagnosis Date   Anemia    Complication of anesthesia    complaints of nausea and vomitting when wisdom teeth were removed   FHx: diabetes mellitus    Headache    Septate uterus    Venous malformation    Right paraspinal    Past Surgical History:  Procedure Laterality Date   CESAREAN SECTION N/A 03/25/2018   Procedure: PRIMARY CESAREAN SECTION;  Surgeon: Harlin Heys, MD;  Location: [facility];  Service: Obstetrics;  Laterality: N/A;  Female born @ [place] N/A 01/15/2022   Procedure: CESAREAN SECTION;  Surgeon: Harlin Heys, MD;  Location: [facility];  Service: Obstetrics;  Laterality: N/A;   DILATION AND EVACUATION N/A 01/07/2017   Procedure: DILATATION AND EVACUATION;  Surgeon: Harlin Heys,  MD;  Location: [facility];  Service: Gynecology;  Laterality: N/A;   WISDOM TOOTH EXTRACTION  2012    Family History  Problem Relation Age of Onset   Migraines Mother    Diabetes Mother    Hypertension Father    Diabetes Father    Alzheimer's disease Maternal Grandmother    Diabetes Paternal Grandmother        family history   Heart failure Paternal Grandfather    Diabetes Paternal Grandfather     Social History   Socioeconomic History   Marital status: Married    Spouse name: Lytle Michaels   Number of children: 1   Years of education: [employer] education level: Associate degree: academic program  Occupational History   Not on file  Tobacco Use   Smoking status: Former    Packs/day: 0.25    Types: Cigarettes   Smokeless tobacco: Never   Tobacco comments:    recently quit  Vaping Use   Vaping Use: Never used  Substance and Sexual Activity   Alcohol use: Not Currently    Alcohol/week: 0.0 standard drinks of alcohol    Comment: socially   Drug use: No   Sexual activity: Yes    Partners: Male    Birth control/protection: Pill  Other Topics Concern   Not on file  Social History Narrative   Married   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 She is currently in school for business.   Works at [employer].    Enjoys swimming, watching movies.   Social Determinants of Health   Financial Resource Strain: Not on file  Food Insecurity: Not on file  Transportation Needs: Not on file  Physical Activity: Not on file  Stress: Not on file  Social Connections: Not on file  Intimate Partner Violence: Not on file    Review of Systems  All other systems reviewed and are negative.       Objective    BP 122/87   Pulse 74   Temp 98 F (36.7 C) (Oral)   Resp 16   Wt 173 lb 12.8 oz (78.8 kg)   SpO2 98%   BMI 27.22 kg/m   Physical Exam Vitals and nursing note reviewed.  Constitutional:      General: She is not in acute distress. HENT:      Head: Normocephalic and atraumatic.     Right Ear: Tympanic membrane, ear canal and external ear normal.     Left Ear: Tympanic membrane, ear canal and external ear normal.     Nose: Nose normal.     Mouth/Throat:     Mouth: Mucous membranes are moist.     Pharynx: Oropharynx is clear.  Eyes:     Conjunctiva/sclera: Conjunctivae normal.     Pupils: Pupils are equal, round, and reactive to light.  Neck:     Thyroid: No thyromegaly.  Cardiovascular:     Rate and Rhythm: Normal rate and regular rhythm.     Heart sounds: Normal heart sounds. No murmur heard. Pulmonary:     Effort: Pulmonary effort is normal. No respiratory distress.     Breath sounds: Normal breath sounds.  Abdominal:     General: There is no distension.     Palpations: Abdomen is soft. There is no mass.     Tenderness: There is no abdominal tenderness.  Musculoskeletal:        General: Normal range of motion.     Cervical back: Normal range of motion and neck supple.  Skin:    General: Skin is warm and dry.  Neurological:     General: No focal deficit present.     Mental Status: She is alert and oriented to person, place, and time.  Psychiatric:        Mood and Affect: Mood normal.        Behavior: Behavior normal.         Assessment & Plan:   1. Annual physical exam  - CMP14+EGFR  2. Screening for deficiency anemia  - CBC with Differential  3. Screening for lipid disorders  - Lipid Panel    Return in about 6 months (around 05/07/2023) for follow up.   Becky Sax, MD

## 2022-11-20 ENCOUNTER — Ambulatory Visit: Payer: Medicaid Other | Admitting: Family Medicine

## 2022-12-17 ENCOUNTER — Telehealth: Payer: Self-pay | Admitting: Family Medicine

## 2022-12-17 NOTE — Telephone Encounter (Signed)
Pt called asking if [facility] would accept her back as a pt? Last ov was 03/02/19. Call back # [phone]

## 2022-12-17 NOTE — Telephone Encounter (Signed)
Yes, of course

## 2022-12-17 NOTE — Telephone Encounter (Signed)
Patient has been scheduled

## 2022-12-18 ENCOUNTER — Encounter: Payer: Self-pay | Admitting: Primary Care

## 2022-12-18 ENCOUNTER — Ambulatory Visit (INDEPENDENT_AMBULATORY_CARE_PROVIDER_SITE_OTHER): Payer: Medicaid Other | Admitting: Primary Care

## 2022-12-18 ENCOUNTER — Encounter: Payer: Self-pay | Admitting: *Deleted

## 2022-12-18 VITALS — BP 124/82 | HR 94 | Temp 97.3°F | Ht 67.0 in | Wt 175.0 lb

## 2022-12-18 DIAGNOSIS — R1013 Epigastric pain: Secondary | ICD-10-CM | POA: Diagnosis not present

## 2022-12-18 DIAGNOSIS — F419 Anxiety disorder, unspecified: Secondary | ICD-10-CM | POA: Diagnosis not present

## 2022-12-18 DIAGNOSIS — F32A Depression, unspecified: Secondary | ICD-10-CM

## 2022-12-18 DIAGNOSIS — M79621 Pain in right upper arm: Secondary | ICD-10-CM | POA: Insufficient documentation

## 2022-12-18 LAB — COMPREHENSIVE METABOLIC PANEL
ALT: 16 U/L (ref 0–35)
AST: 17 U/L (ref 0–37)
Albumin: 4.2 g/dL (ref 3.5–5.2)
Alkaline Phosphatase: 40 U/L (ref 39–117)
BUN: 14 mg/dL (ref 6–23)
CO2: 29 mEq/L (ref 19–32)
Calcium: 9.9 mg/dL (ref 8.4–10.5)
Chloride: 101 mEq/L (ref 96–112)
Creatinine, Ser: 0.78 mg/dL (ref 0.40–1.20)
GFR: 103.87 mL/min (ref >60.00)
Glucose, Bld: 71 mg/dL (ref 70–99)
Potassium: 4 mEq/L (ref 3.5–5.1)
Sodium: 140 mEq/L (ref 135–145)
Total Bilirubin: 0.4 mg/dL (ref 0.2–1.2)
Total Protein: 7.3 g/dL (ref 6.0–8.3)

## 2022-12-18 LAB — CBC
HCT: 38.3 % (ref 36.0–46.0)
Hemoglobin: 12.8 g/dL (ref 12.0–15.0)
MCHC: 33.3 g/dL (ref 30.0–36.0)
MCV: 85.9 fl (ref 78.0–100.0)
Platelets: 309 10*3/uL (ref 150.0–400.0)
RBC: 4.46 Mil/uL (ref 3.87–5.11)
RDW: 13.6 % (ref 11.5–15.5)
WBC: 7.5 10*3/uL (ref 4.0–10.5)

## 2022-12-18 LAB — LIPASE: Lipase: 6 U/L — ABNORMAL LOW (ref 11.0–59.0)

## 2022-12-18 NOTE — Progress Notes (Addendum)
Subjective:    Patient ID: Holly Meyer, female    DOB: Nov 06, 1995, 27 y.o.   MRN: [id number]  HPI  Holly Meyer is a very pleasant 27 y.o. female who presents today who presents today to re-establish care and discuss the problems mentioned below. Will obtain/review records.  1) Abdominal Pain: Chronic since >1 year ago and located to the epigastric region. Flares are intermittent occurring twice weekly on average, worse with eating certain foods (pasta, tacos, spicy food). She describes her pain as twisting "like someone is ringing my intestines". The pain will sometimes radiate to the bilateral upper abdomen. Sometimes with nausea. During flares she will take Tylenol and tagamet with improvement after one hour.   She denies esophageal, chest pressure, diarrhea, constipation. Bowel movements are 1-2 times daily. She underwent an [place] of her gall bladder a few years ago, was told that symptoms were not from gall bladder.   2) Anxiety and Depression: Currently managed on Zoloft 100 mg daily for which she's been taking 100 mg since December 2023. Overall she feels well managed.   3) Axillary Pain: Chronic to the right axilla for which she describes as an "ache". She's also noticed a mass to the upper right axilla. Her pain is intermittent, mostly with palpation of her right axilla.   She denies skin texture changes.   Review of Systems  Constitutional:  Negative for fever.  Gastrointestinal:  Positive for abdominal pain and nausea. Negative for constipation and diarrhea.  Genitourinary:        Right axillar mass and tenderness  Psychiatric/Behavioral:  The patient is nervous/anxious.          Past Medical History:  Diagnosis Date   Anemia    Complication of anesthesia    complaints of nausea and vomitting when wisdom teeth were removed   FHx: diabetes mellitus    Headache    History of miscarriage, currently pregnant, first trimester 08/28/2017   Patient desires vaginal birth  after cesarean section (VBAC) 08/15/2021   Septate uterus    Term pregnancy, repeat 01/15/2022   Venous malformation    Right paraspinal    Social History   Socioeconomic History   Marital status: Married    Spouse name: Lytle Michaels   Number of children: 1   Years of education: [employer] education level: Associate degree: academic program  Occupational History   Not on file  Tobacco Use   Smoking status: Former    Packs/day: 0.25    Types: Cigarettes   Smokeless tobacco: Never   Tobacco comments:    recently quit  Vaping Use   Vaping Use: Never used  Substance and Sexual Activity   Alcohol use: Not Currently    Alcohol/week: 0.0 standard drinks of alcohol    Comment: socially   Drug use: No   Sexual activity: Yes    Partners: Male    Birth control/protection: Pill  Other Topics Concern   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   Works at [employer].    Enjoys swimming, watching movies.   Social Determinants of Health   Financial Resource Strain: Not on file  Food Insecurity: Not on file  Transportation Needs: Not on file  Physical Activity: Not on file  Stress: Not on file  Social Connections: Not on file  Intimate Partner Violence: Not on file  Past Surgical History:  Procedure Laterality Date   CESAREAN SECTION N/A 03/25/2018   Procedure: PRIMARY CESAREAN SECTION;  Surgeon: Harlin Heys, MD;  Location: [facility];  Service: Obstetrics;  Laterality: N/A;  Female born @ [place] N/A 01/15/2022   Procedure: CESAREAN SECTION;  Surgeon: Harlin Heys, MD;  Location: [facility];  Service: Obstetrics;  Laterality: N/A;   DILATION AND EVACUATION N/A 01/07/2017   Procedure: DILATATION AND EVACUATION;  Surgeon: Harlin Heys, MD;  Location: [facility];  Service: Gynecology;  Laterality: N/A;   WISDOM TOOTH EXTRACTION   2012    Family History  Problem Relation Age of Onset   Migraines Mother    Diabetes Mother    Hypertension Father    Diabetes Father    Alzheimer's disease Maternal Grandmother    Diabetes Paternal Grandmother        family history   Heart failure Paternal Grandfather    Diabetes Paternal Grandfather     Allergies  Allergen Reactions   Fexofenadine Shortness Of Breath and Other (See Comments)    Respiratory Distress (ALLERGY/intolerance)   Imitrex [Sumatriptan] Other (See Comments)    Symptoms worsened   Guaifenesin Other (See Comments)    Dizziness (intolerance)   Guaifenesin Er Other (See Comments)    Dizziness   Meloxicam Other (See Comments)    Unknown   Naproxen Other (See Comments)    Loss of appiteite   Tomato Rash    Tongue gets sores     Current Outpatient Medications on File Prior to Visit  Medication Sig Dispense Refill   acetaminophen (TYLENOL) 500 MG tablet Take 500 mg by mouth every 6 (six) hours as needed for moderate pain or headache.      ibuprofen (ADVIL) 600 MG tablet Take 1 tablet (600 mg total) by mouth every 6 (six) hours. 30 tablet 0   KARIVA 0.15-0.02/0.01 MG (21/5) tablet TAKE 1 TABLET BY MOUTH EVERYDAY AT BEDTIME 84 tablet 1   sertraline (ZOLOFT) 100 MG tablet TAKE 1 TABLET BY MOUTH EVERY DAY 90 tablet 0   No current facility-administered medications on file prior to visit.    BP 124/82   Pulse 94   Temp (!) 97.3 F (36.3 C) (Temporal)   Ht '5\' 7"'$  (1.702 m)   Wt 175 lb (79.4 kg)   LMP 12/17/2022 (Exact Date)   SpO2 97%   Breastfeeding No   BMI 27.41 kg/m  Objective:   Physical Exam Cardiovascular:     Rate and Rhythm: Normal rate and regular rhythm.  Pulmonary:     Effort: Pulmonary effort is normal.     Breath sounds: Normal breath sounds.  Chest:  Breasts:    Right: No mass, skin change or tenderness.     Left: No mass, skin change or tenderness.       Comments: Mass and tenderness noted to right upper axilla.   Abdominal:     General: Bowel sounds are normal.     Palpations: Abdomen is soft.     Tenderness: There is no abdominal tenderness.  Musculoskeletal:     Cervical back: Neck supple.  Lymphadenopathy:     Upper Body:     Right upper body: No axillary adenopathy.     Left upper body: No axillary adenopathy.  Skin:    General: Skin is warm and dry.   Density palpated        Assessment & Plan:  Axillary pain, right Assessment &  Plan: Density palpated on exam today.  Ultrasound of right axilla and breast ordered and pending.  Orders: -     US BREAST COMPLETE UNI RIGHT INC AXILLA; Future  Epigastric abdominal pain Assessment & Plan: No alarm signs on exam today. Differentials include GERD, hiatal hernia, cholelithiasis.  Less likely H. pylori.  [place] of RUQ abdomen ordered and pending. Checking labs today including CMP, CBC, lipase.  Start famotidine 20 mg daily for at least 1 to 2 months. Handout provided today for triggers of GERD.  She will update.  Orders: -     US ABDOMEN LIMITED RUQ (LIVER/GB); Future -     Comprehensive metabolic panel -     CBC -     Lipase  Anxiety and depression Assessment & Plan: Overall controlled.  Continue Zoloft 100 mg daily.         [name], NP

## 2022-12-18 NOTE — Assessment & Plan Note (Signed)
Overall controlled.  Continue Zoloft 100 mg daily.

## 2022-12-18 NOTE — Assessment & Plan Note (Signed)
Density palpated on exam today.  Ultrasound of right axilla and breast ordered and pending.

## 2022-12-18 NOTE — Assessment & Plan Note (Signed)
No alarm signs on exam today. Differentials include GERD, hiatal hernia, cholelithiasis.  Less likely H. pylori.  [place] of RUQ abdomen ordered and pending. Checking labs today including CMP, CBC, lipase.  Start famotidine 20 mg daily for at least 1 to 2 months. Handout provided today for triggers of GERD.  She will update.

## 2022-12-18 NOTE — Patient Instructions (Signed)
You will be contacted via phone regarding your breast/arm ultrasound and abdominal ultrasound.   Start famotidine (Pepcid) 20 mg daily for at least 1 to 2 months.  Take a look at the handout below.  Stop by the lab prior to leaving today. I will notify you of your results once received.   It was a pleasure to see you today!

## 2022-12-18 NOTE — Addendum Note (Signed)
Addended by: [name] on: 12/18/2022 09:49 AM   Modules accepted: Orders

## 2022-12-22 ENCOUNTER — Other Ambulatory Visit: Payer: Self-pay | Admitting: Family Medicine

## 2022-12-27 ENCOUNTER — Ambulatory Visit
Admission: RE | Admit: 2022-12-27 | Discharge: 2022-12-27 | Disposition: A | Discharge status: Home / Self Care | Payer: Medicaid Other | Source: Ambulatory Visit | Attending: Primary Care | Admitting: Primary Care

## 2022-12-27 ENCOUNTER — Other Ambulatory Visit: Payer: Self-pay | Admitting: Primary Care

## 2022-12-27 DIAGNOSIS — K8020 Calculus of gallbladder without cholecystitis without obstruction: Secondary | ICD-10-CM | POA: Diagnosis not present

## 2022-12-27 DIAGNOSIS — R1010 Upper abdominal pain, unspecified: Secondary | ICD-10-CM | POA: Diagnosis not present

## 2022-12-27 DIAGNOSIS — M79621 Pain in right upper arm: Secondary | ICD-10-CM | POA: Insufficient documentation

## 2022-12-27 DIAGNOSIS — R1013 Epigastric pain: Secondary | ICD-10-CM | POA: Diagnosis present

## 2022-12-27 DIAGNOSIS — N644 Mastodynia: Secondary | ICD-10-CM | POA: Diagnosis not present

## 2022-12-27 DIAGNOSIS — F32A Depression, unspecified: Secondary | ICD-10-CM

## 2023-01-03 ENCOUNTER — Ambulatory Visit
Admission: EM | Admit: 2023-01-03 | Discharge: 2023-01-03 | Disposition: A | Discharge status: Home / Self Care | Payer: Medicaid Other | Attending: Internal Medicine | Admitting: Internal Medicine

## 2023-01-03 ENCOUNTER — Encounter (INDEPENDENT_AMBULATORY_CARE_PROVIDER_SITE_OTHER): Payer: Medicaid Other

## 2023-01-03 DIAGNOSIS — R112 Nausea with vomiting, unspecified: Secondary | ICD-10-CM | POA: Diagnosis not present

## 2023-01-03 DIAGNOSIS — J0301 Acute recurrent streptococcal tonsillitis: Secondary | ICD-10-CM

## 2023-01-03 DIAGNOSIS — J020 Streptococcal pharyngitis: Secondary | ICD-10-CM | POA: Diagnosis not present

## 2023-01-03 LAB — POCT RAPID STREP A (OFFICE): Rapid Strep A Screen: POSITIVE — AB

## 2023-01-03 MED ORDER — KETOROLAC TROMETHAMINE 30 MG/ML IJ SOLN
30.0000 mg | Freq: Once | INTRAMUSCULAR | Status: AC
  Administered 2023-01-03: 30 mg via INTRAMUSCULAR

## 2023-01-03 MED ORDER — AMOXICILLIN 500 MG PO CAPS: 500.0000 mg | ORAL_CAPSULE | Freq: Two times a day (BID) | ORAL | 0 refills | Status: AC

## 2023-01-03 MED ORDER — ONDANSETRON HCL 4 MG/2ML IJ SOLN
4.0000 mg | Freq: Once | INTRAMUSCULAR | Status: AC
  Administered 2023-01-03: 4 mg via INTRAMUSCULAR

## 2023-01-03 MED ORDER — ONDANSETRON 4 MG PO TBDP: 4.0000 mg | ORAL_TABLET | Freq: Three times a day (TID) | ORAL | 0 refills | Status: DC | PRN

## 2023-01-03 NOTE — ED Provider Notes (Signed)
[name]    CSN: [id number] Arrival date & time: 01/03/23  0801      History   Chief Complaint Chief Complaint  Patient presents with   Sore Throat   Generalized Body Aches    HPI Holly Meyer is a 27 y.o. female.   Patient presents to urgent care for evaluation of sore throat, fever/chills, and nausea that started yesterday afternoon/evening.  Experiencing nonbloody/nonbilious emesis for the first time during this illness during triage to urgent care.  Reports generalized abdominal aching as well.  Denies nasal congestion, cough, headache, heart palpitations, chest pain, shortness of breath, back pain, urinary symptoms, dizziness, and recent intake of food/fluids outside of her normal diet.  She denies chance of pregnancy and takes oral contraceptives.  She has not missed any doses of her oral contraception.  She denies known sick contacts with similar symptoms.  No rash.  She is a former smoker, denies drug use.  She took 400 mg of ibuprofen this morning at 2 to 3 AM (6 hours ago) and states that this did not help very much with her symptoms.  Currently nauseous and actively vomiting in triage.  No recent antibiotic or steroid use.  No recent abdominal surgeries.  No flank pain reported.   Sore Throat    Past Medical History:  Diagnosis Date   Anemia    Complication of anesthesia    complaints of nausea and vomitting when wisdom teeth were removed   FHx: diabetes mellitus    Headache    History of miscarriage, currently pregnant, first trimester 08/28/2017   Patient desires vaginal birth after cesarean section (VBAC) 08/15/2021   Septate uterus    Term pregnancy, repeat 01/15/2022   Venous malformation    Right paraspinal    Patient Active Problem List   Diagnosis Date Noted   Axillary pain, right 12/18/2022   Epigastric abdominal pain 12/18/2022   Anxiety and depression 12/18/2022   Preventative health care 02/01/2017   Allergic rhinitis  02/28/2016   Headache, migraine 08/02/2015   Back pain 04/28/2015   Venous malformation 02/28/2012    Past Surgical History:  Procedure Laterality Date   CESAREAN SECTION N/A 03/25/2018   Procedure: PRIMARY CESAREAN SECTION;  Surgeon: Harlin Heys, MD;  Location: [facility];  Service: Obstetrics;  Laterality: N/A;  Female born @ [place] N/A 01/15/2022   Procedure: CESAREAN SECTION;  Surgeon: Harlin Heys, MD;  Location: [facility];  Service: Obstetrics;  Laterality: N/A;   DILATION AND EVACUATION N/A 01/07/2017   Procedure: DILATATION AND EVACUATION;  Surgeon: Harlin Heys, MD;  Location: [facility];  Service: Gynecology;  Laterality: N/A;   WISDOM TOOTH EXTRACTION  2012    OB History     Gravida  3   Para  2   Term  2   Preterm  0   AB  1   Living  2      SAB  1   IAB  0   Ectopic  0   Multiple  0   Live Births  2            Home Medications    Prior to Admission medications   Medication Sig Start Date End Date Taking? Authorizing Provider  amoxicillin (AMOXIL) 500 MG capsule Take 1 capsule (500 mg total) by mouth 2 (two) times daily for 10 days. 01/03/23 01/13/23 Yes Talbot Grumbling, FNP  ondansetron (ZOFRAN-ODT) 4 MG disintegrating  tablet Take 1 tablet (4 mg total) by mouth every 8 (eight) hours as needed for nausea or vomiting. 01/03/23  Yes Talbot Grumbling, FNP  acetaminophen (TYLENOL) 500 MG tablet Take 500 mg by mouth every 6 (six) hours as needed for moderate pain or headache.     [provider]  ibuprofen (ADVIL) 600 MG tablet Take 1 tablet (600 mg total) by mouth every 6 (six) hours. 01/18/22   Harlin Heys, MD  KARIVA 0.15-0.02/0.01 MG (21/5) tablet TAKE 1 TABLET BY MOUTH EVERYDAY AT BEDTIME 09/11/22   Harlin Heys, MD  sertraline (ZOLOFT) 100 MG tablet TAKE 1 TABLET BY MOUTH EVERY DAY 12/24/22   Dorna Mai, MD    Family History Family History  Problem Relation Age of Onset    Migraines Mother    Diabetes Mother    Hypertension Father    Diabetes Father    Alzheimer's disease Maternal Grandmother    Diabetes Paternal Grandmother        family history   Heart failure Paternal Grandfather    Diabetes Paternal Grandfather     Social History Social History   Tobacco Use   Smoking status: Former    Packs/day: .25    Types: Cigarettes   Smokeless tobacco: Never   Tobacco comments:    recently quit  Vaping Use   Vaping Use: Never used  Substance Use Topics   Alcohol use: Not Currently    Alcohol/week: 0.0 standard drinks of alcohol    Comment: socially   Drug use: No     Allergies   Fexofenadine, Imitrex [sumatriptan], Guaifenesin, Guaifenesin er, Meloxicam, Naproxen, and Tomato   Review of Systems Review of Systems Per HPI  Physical Exam Triage Vital Signs ED Triage Vitals  Enc Vitals Group     BP 01/03/23 0837 131/79     Pulse Rate 01/03/23 0837 (!) 111     Resp 01/03/23 0837 20     Temp 01/03/23 0837 (!) 101.5 F (38.6 C)     Temp Source 01/03/23 0837 Oral     SpO2 01/03/23 0837 99 %     Weight --      Height --      Head Circumference --      Peak Flow --      Pain Score 01/03/23 0834 8     Pain Loc --      Pain Edu? --      Excl. in [place]? --    No data found.  Updated Vital Signs BP 131/79 (BP Location: Left Arm)   Pulse (!) 111   Temp (!) 101.5 F (38.6 C) (Oral)   Resp 20   LMP 12/17/2022 (Exact Date)   SpO2 99%   Breastfeeding No   Visual Acuity Right Eye Distance:   Left Eye Distance:   Bilateral Distance:    Right Eye Near:   Left Eye Near:    Bilateral Near:     Physical Exam Vitals and nursing note reviewed.  Constitutional:      Appearance: She is ill-appearing. She is not toxic-appearing.  HENT:     Head: Normocephalic and atraumatic.     Right Ear: Hearing, tympanic membrane, ear canal and external ear normal.     Left Ear: Hearing, tympanic membrane, ear canal and external ear normal.     Nose:  Nose normal.     Mouth/Throat:     Lips: Pink.     Mouth: Mucous membranes are moist. No  injury.     Tongue: No lesions. Tongue does not deviate from midline.     Palate: No mass and lesions.     Pharynx: Uvula midline. Pharyngeal swelling and posterior oropharyngeal erythema present. No oropharyngeal exudate or uvula swelling.     Tonsils: No tonsillar exudate or tonsillar abscesses. 1+ on the right. 1+ on the left.  Eyes:     General: Lids are normal. Vision grossly intact. Gaze aligned appropriately.     Extraocular Movements: Extraocular movements intact.     Conjunctiva/sclera: Conjunctivae normal.  Cardiovascular:     Rate and Rhythm: Normal rate and regular rhythm.     Heart sounds: Normal heart sounds, S1 normal and S2 normal.  Pulmonary:     Effort: Pulmonary effort is normal. No respiratory distress.     Breath sounds: Normal breath sounds and air entry.  Abdominal:     General: Abdomen is flat. Bowel sounds are normal.     Palpations: Abdomen is soft.     Tenderness: There is generalized abdominal tenderness.  Musculoskeletal:     Cervical back: Neck supple.  Lymphadenopathy:     Cervical: Cervical adenopathy present.  Skin:    General: Skin is warm and dry.     Capillary Refill: Capillary refill takes less than 2 seconds.     Findings: No rash.  Neurological:     General: No focal deficit present.     Mental Status: She is alert and oriented to person, place, and time. Mental status is at baseline.     Cranial Nerves: No dysarthria or facial asymmetry.  Psychiatric:        Mood and Affect: Mood normal.        Speech: Speech normal.        Behavior: Behavior normal.        Thought Content: Thought content normal.        Judgment: Judgment normal.      UC Treatments / Results  Labs (all labs ordered are listed, but only abnormal results are displayed) Labs Reviewed  POCT RAPID STREP A (OFFICE) - Abnormal; Notable for the following components:      Result  Value   Rapid Strep A Screen Positive (*)    All other components within normal limits    EKG   Radiology No results found.  Procedures Procedures (including critical care time)  Medications Ordered in UC Medications  ketorolac (TORADOL) 30 MG/ML injection 30 mg (30 mg Intramuscular Given 01/03/23 0853)  ondansetron (ZOFRAN) injection 4 mg (4 mg Intramuscular Given 01/03/23 0853)    Initial Impression / Assessment and Plan / UC Course  I have reviewed the triage vital signs and the nursing notes.  Pertinent labs & imaging results that were available during my care of the patient were reviewed by me and considered in my medical decision making (see chart for details).   1.  Strep pharyngitis Group A strep testing is positive in clinic.  On arrival, patient actively vomiting in triage and ill-appearing, however nontoxic in appearance.  Fever of 101.5.  Patient given ketorolac 30 mg IM and Zofran 4 mg IM for fever and nausea/vomiting.  She has a documented allergy to meloxicam and naproxen but is unsure of reactions to both.  She believes naproxen causes her to have a decreased appetite due to upset stomach after taking this.  She has taken ibuprofen without any problem and is agreeable with taking ketorolac.  Continues to deny chance of pregnancy.  Last withdrawal bleed was on December 17, 2022.    Amoxicillin twice daily for the next 10 days sent to pharmacy.  Zofran 4 mg ODT may be used as needed for nausea and vomiting at home.  Encouraged clear liquid diet and bland foods over the next 12 to 24 hours, then may increase diet as tolerated.  Abdominal exam is without peritoneal signs.  Zofran and ketorolac improved symptoms significantly in clinic.  Prior to leaving clinic, patient able to tolerate sips of oral fluids without throwing up.  Discussed physical exam and available lab work findings in clinic with patient.  Counseled patient regarding appropriate use of medications and potential  side effects for all medications recommended or prescribed today. Discussed red flag signs and symptoms of worsening condition,when to call the PCP office, return to urgent care, and when to seek higher level of care in the emergency department. Patient verbalizes understanding and agreement with plan. All questions answered. Patient discharged in stable condition.    Final Clinical Impressions(s) / UC Diagnoses   Final diagnoses:  Strep pharyngitis  Nausea and vomiting, unspecified vomiting type     Discharge Instructions      You have strep throat.  - Take the prescribed antibiotic as directed for the next 10 days. - Take ibuprofen 600 mg every 6 hours as needed for throat pain and fever.   No ibuprofen until tomorrow due to the ketorolac injection in clinic. - Change your toothbrush after 2 to 3 days of antibiotic use to prevent reinfection. - You may also gargle with salt water and drink warm tea with honey to soothe your throat.  If you develop any new or worsening symptoms or do not improve in the next 2 to 3 days, please return.  If your symptoms are severe, please go to the emergency room.  Follow-up with your primary care provider for further evaluation and management of your symptoms as well as ongoing wellness visits.  I hope you feel better!     ED Prescriptions     Medication Sig Dispense Auth. Provider   ondansetron (ZOFRAN-ODT) 4 MG disintegrating tablet Take 1 tablet (4 mg total) by mouth every 8 (eight) hours as needed for nausea or vomiting. 20 tablet Joella Prince M, FNP   amoxicillin (AMOXIL) 500 MG capsule Take 1 capsule (500 mg total) by mouth 2 (two) times daily for 10 days. 20 capsule Talbot Grumbling, FNP      PDMP not reviewed this encounter.   Joella Prince [place], [place] 01/03/23 [phone]

## 2023-01-03 NOTE — Discharge Instructions (Signed)
You have strep throat.  - Take the prescribed antibiotic as directed for the next 10 days. - Take ibuprofen 600 mg every 6 hours as needed for throat pain and fever.   No ibuprofen until tomorrow due to the ketorolac injection in clinic. - Change your toothbrush after 2 to 3 days of antibiotic use to prevent reinfection. - You may also gargle with salt water and drink warm tea with honey to soothe your throat.  If you develop any new or worsening symptoms or do not improve in the next 2 to 3 days, please return.  If your symptoms are severe, please go to the emergency room.  Follow-up with your primary care provider for further evaluation and management of your symptoms as well as ongoing wellness visits.  I hope you feel better!

## 2023-01-03 NOTE — ED Triage Notes (Signed)
Pt tearful during intake, reports generally feeling miserable. Sore throat, body aches, HA, nausea starting last night. Took Ibuprofen 0200.  Vomiting during intake.

## 2023-01-04 NOTE — Telephone Encounter (Signed)
Please see the MyChart message reply(ies) for my assessment and plan.  The patient gave consent for this Medical Advice Message and is aware that it may result in a bill to their insurance company as well as the possibility that this may result in a co-payment or deductible. They are an established patient, but are not seeking medical advice exclusively about a problem treated during an in person or video visit in the last 7 days. I did not recommend an in person or video visit within 7 days of my reply.  I spent a total of 10 minutes cumulative time within 7 days through MyChart messaging  K , NP  

## 2023-01-07 ENCOUNTER — Encounter: Payer: Self-pay | Admitting: *Deleted

## 2023-01-14 DIAGNOSIS — K8020 Calculus of gallbladder without cholecystitis without obstruction: Secondary | ICD-10-CM

## 2023-01-14 HISTORY — DX: Calculus of gallbladder without cholecystitis without obstruction: K80.20

## 2023-01-22 DIAGNOSIS — K8020 Calculus of gallbladder without cholecystitis without obstruction: Secondary | ICD-10-CM

## 2023-01-25 ENCOUNTER — Encounter: Payer: Self-pay | Admitting: *Deleted

## 2023-01-30 ENCOUNTER — Encounter: Payer: Self-pay | Admitting: Primary Care

## 2023-01-30 ENCOUNTER — Ambulatory Visit (INDEPENDENT_AMBULATORY_CARE_PROVIDER_SITE_OTHER): Payer: Medicaid Other | Admitting: Primary Care

## 2023-01-30 VITALS — BP 126/84 | HR 104 | Temp 98.3°F | Ht 67.0 in | Wt 168.0 lb

## 2023-01-30 DIAGNOSIS — J029 Acute pharyngitis, unspecified: Secondary | ICD-10-CM | POA: Diagnosis not present

## 2023-01-30 LAB — POC COVID19 BINAXNOW: SARS Coronavirus 2 Ag: NEGATIVE

## 2023-01-30 LAB — POCT RAPID STREP A (OFFICE): Rapid Strep A Screen: NEGATIVE

## 2023-01-30 NOTE — Assessment & Plan Note (Signed)
Exam today suspicious for some sort of infection. Fortunately, she's feeling much better today.  Rapid strep and rapid Covid tests are negative.  Will obtain throat culture today.  Continue with conservative treatment for now. If symptoms become worse she will notify. Await culture results.

## 2023-01-30 NOTE — Patient Instructions (Addendum)
We will be in touch regarding the throat culture results.  Consider Ibuprofen for pain and inflammation.  Notify me if your symptoms return.   It was a pleasure to see you today!

## 2023-01-30 NOTE — Progress Notes (Signed)
Subjective:    Patient ID: Holly Meyer, female    DOB: Aug 15, 1996, 27 y.o.   MRN: [id number]  Sore Throat  Pertinent negatives include no coughing or headaches.    Holly Meyer is a very pleasant 27 y.o. female with a history of strep pharyngitis, migraines anxiety and depression who presents today to discuss sore throat.  Symptom onset yesterday with fever (101), body aches, chills, sore throat with white patches. She was exposed to strep pharyngitis last week by a co-worker. She's not had a fever today.   Symptoms today include sore throat, body aches to the back. She's been taking Dayquil yesterday. She denies cough. Today she's feeling much better than yesterday.    Review of Systems  Constitutional:  Positive for chills and fever.  HENT:  Positive for sore throat. Negative for postnasal drip.   Respiratory:  Negative for cough.   Allergic/Immunologic: Positive for environmental allergies.  Neurological:  Negative for headaches.         Past Medical History:  Diagnosis Date   Anemia    Complication of anesthesia    complaints of nausea and vomitting when wisdom teeth were removed   FHx: diabetes mellitus    Headache    History of miscarriage, currently pregnant, first trimester 08/28/2017   Patient desires vaginal birth after cesarean section (VBAC) 08/15/2021   Septate uterus    Term pregnancy, repeat 01/15/2022   Venous malformation    Right paraspinal    Social History   Socioeconomic History   Marital status: Married    Spouse name: Thurston Pounds   Number of children: 1   Years of education: Automotive engineer   Highest education level: Associate degree: academic program  Occupational History   Not on file  Tobacco Use   Smoking status: Former    Packs/day: .25    Types: Cigarettes   Smokeless tobacco: Never   Tobacco comments:    recently quit  Vaping Use   Vaping Use: Never used  Substance and Sexual Activity   Alcohol use: Not Currently    Alcohol/week:  0.0 standard drinks of alcohol    Comment: socially   Drug use: No   Sexual activity: Yes    Partners: Male    Birth control/protection: Pill  Other Topics Concern   Not on file  Social History Narrative   Married   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 She is currently in school for business.   Works at [employer].    Enjoys swimming, watching movies.   Social Determinants of Health   Financial Resource Strain: Not on file  Food Insecurity: Not on file  Transportation Needs: Not on file  Physical Activity: Not on file  Stress: Not on file  Social Connections: Not on file  Intimate Partner Violence: Not on file    Past Surgical History:  Procedure Laterality Date   CESAREAN SECTION N/A 03/25/2018   Procedure: PRIMARY CESAREAN SECTION;  Surgeon: Linzie Collin, MD;  Location: [facility];  Service: Obstetrics;  Laterality: N/A;  Female born @ [birth year]     CESAREAN SECTION N/A 01/15/2022   Procedure: CESAREAN SECTION;  Surgeon: Linzie Collin, MD;  Location: [facility];  Service: Obstetrics;  Laterality: N/A;   DILATION AND EVACUATION N/A 01/07/2017   Procedure: DILATATION AND EVACUATION;  Surgeon: Linzie Collin, MD;  Location: [facility];  Service: Gynecology;  Laterality: N/A;   WISDOM TOOTH EXTRACTION  2012    Family  History  Problem Relation Age of Onset   Migraines Mother    Diabetes Mother    Hypertension Father    Diabetes Father    Alzheimer's disease Maternal Grandmother    Diabetes Paternal Grandmother        family history   Heart failure Paternal Grandfather    Diabetes Paternal Grandfather     Allergies  Allergen Reactions   Fexofenadine Shortness Of Breath and Other (See Comments)    Respiratory Distress (ALLERGY/intolerance)   Imitrex [Sumatriptan] Other (See Comments)    Symptoms worsened   Guaifenesin Other (See Comments)    Dizziness (intolerance)   Guaifenesin Er Other (See Comments)    Dizziness   Meloxicam Other  (See Comments)    Unknown   Naproxen Other (See Comments)    Loss of appiteite   Tomato Rash    Tongue gets sores     Current Outpatient Medications on File Prior to Visit  Medication Sig Dispense Refill   acetaminophen (TYLENOL) 500 MG tablet Take 500 mg by mouth every 6 (six) hours as needed for moderate pain or headache.      ibuprofen (ADVIL) 600 MG tablet Take 1 tablet (600 mg total) by mouth every 6 (six) hours. 30 tablet 0   KARIVA 0.15-0.02/0.01 MG (21/5) tablet TAKE 1 TABLET BY MOUTH EVERYDAY AT BEDTIME 84 tablet 1   ondansetron (ZOFRAN-ODT) 4 MG disintegrating tablet Take 1 tablet (4 mg total) by mouth every 8 (eight) hours as needed for nausea or vomiting. 20 tablet 0   sertraline (ZOLOFT) 100 MG tablet TAKE 1 TABLET BY MOUTH EVERY DAY 30 tablet 0   No current facility-administered medications on file prior to visit.    BP 126/84   Pulse (!) 104   Temp 98.3 F (36.8 C) (Temporal)   Ht  (1.702 m)   Wt 168 lb (76.2 kg)   SpO2 98%   BMI 26.31 kg/m  Objective:   Physical Exam Constitutional:      Appearance: She is not ill-appearing.  HENT:     Right Ear: Tympanic membrane and ear canal normal.     Left Ear: Tympanic membrane and ear canal normal.     Mouth/Throat:     Mouth: Mucous membranes are moist.     Pharynx: Pharyngeal swelling, oropharyngeal exudate and posterior oropharyngeal erythema present.     Tonsils: Tonsillar exudate present. 2+ on the right.  Cardiovascular:     Rate and Rhythm: Normal rate and regular rhythm.  Pulmonary:     Effort: Pulmonary effort is normal.     Breath sounds: Normal breath sounds.           Assessment & Plan:  Sore throat Assessment & Plan: Exam today suspicious for some sort of infection. Fortunately, she's feeling much better today.  Rapid strep and rapid Covid tests are negative.  Will obtain throat culture today.  Continue with conservative treatment for now. If symptoms become worse she will  notify. Await culture results.  Orders: -     POCT rapid strep A -     Culture, Group A Strep        Doreene Nest, NP

## 2023-02-01 DIAGNOSIS — A491 Streptococcal infection, unspecified site: Secondary | ICD-10-CM

## 2023-02-01 LAB — CULTURE, GROUP A STREP
MICRO NUMBER:: 14837357
SPECIMEN QUALITY:: ADEQUATE

## 2023-02-01 MED ORDER — AMOXICILLIN-POT CLAVULANATE 875-125 MG PO TABS: 1.0000 | ORAL_TABLET | Freq: Two times a day (BID) | ORAL | 0 refills | Status: DC

## 2023-02-04 ENCOUNTER — Telehealth: Payer: Self-pay | Admitting: Primary Care

## 2023-02-04 NOTE — Telephone Encounter (Signed)
I am working on finding a location that will accept her insurance.

## 2023-02-04 NOTE — Telephone Encounter (Signed)
   Reason for Referral Request: Need new referral, prev sent to [facility] [place] [facility] for gallbladder issues   Has patient been seen PCP for this complaint? yes  No,  please schedule patient for appointment for complaint.  Patient scheduled on:   Yes, please find out following information.  Referral for which specialty: Gallbladder/ General Surgery   Preferred office/provider: any, must accept [place] medicaid [place] [facility]   Patient states she received a letter from [facility] stating they did not accept her insurance, would like a new referral sent somewhere that does accept this plan.

## 2023-02-08 ENCOUNTER — Ambulatory Visit (INDEPENDENT_AMBULATORY_CARE_PROVIDER_SITE_OTHER): Payer: Medicaid Other | Admitting: Surgery

## 2023-02-08 ENCOUNTER — Encounter: Payer: Self-pay | Admitting: Surgery

## 2023-02-08 VITALS — BP 119/84 | HR 93 | Temp 98.0°F | Ht 67.0 in | Wt 171.0 lb

## 2023-02-08 DIAGNOSIS — K8020 Calculus of gallbladder without cholecystitis without obstruction: Secondary | ICD-10-CM | POA: Diagnosis not present

## 2023-02-08 NOTE — Progress Notes (Signed)
02/08/2023  Reason for Visit:  Symptomatic cholelithiasis  Requesting Provider:  Vernona Rieger, NP  History of Present Illness: Holly Meyer is a 27 y.o. female presenting for evaluation of symptomatic cholelithiasis.  The patient reports that during her last pregnancy, she developed abdominal pain and was told that it could be her gallbladder although I do not see any imaging that had been done by then of her right upper quadrant.  She reports after pregnancy she was doing better but now more recently has developed again abdominal discomfort.  The pain is located in the epigastric area and radiates to her back.  The pain is accompanied by nausea but no emesis.  Denies any fevers or chills.  At first the episodes were very infrequent but now they are becoming much more frequent about 4 times per week.  This can happen even when eating fruits but certainly if she eats anything greasy.  She is currently recovering from episode of strep pharyngitis.  She is taking her antibiotics as instructed.  She did have fevers recently but she attributes this to the strep throat.  Past Medical History: Past Medical History:  Diagnosis Date   Anemia    Complication of anesthesia    complaints of nausea and vomitting when wisdom teeth were removed   FHx: diabetes mellitus    Headache    History of miscarriage, currently pregnant, first trimester 08/28/2017   Patient desires vaginal birth after cesarean section (VBAC) 08/15/2021   Septate uterus    Term pregnancy, repeat 01/15/2022   Venous malformation    Right paraspinal     Past Surgical History: Past Surgical History:  Procedure Laterality Date   CESAREAN SECTION N/A 03/25/2018   Procedure: PRIMARY CESAREAN SECTION;  Surgeon: Linzie Collin, MD;  Location: [facility] ORS;  Service: Obstetrics;  Laterality: N/A;  Female born @ 97     CESAREAN SECTION N/A 01/15/2022   Procedure: CESAREAN SECTION;  Surgeon: Linzie Collin, MD;  Location:  [facility] ORS;  Service: Obstetrics;  Laterality: N/A;   DILATION AND EVACUATION N/A 01/07/2017   Procedure: DILATATION AND EVACUATION;  Surgeon: Linzie Collin, MD;  Location: [facility] ORS;  Service: Gynecology;  Laterality: N/A;   WISDOM TOOTH EXTRACTION  2012    Home Medications: Prior to Admission medications   Medication Sig Start Date End Date Taking? Authorizing Provider  acetaminophen (TYLENOL) 500 MG tablet Take 500 mg by mouth every 6 (six) hours as needed for moderate pain or headache.    Yes [provider]  amoxicillin-clavulanate (AUGMENTIN) 875-125 MG tablet Take 1 tablet by mouth 2 (two) times daily. 02/01/23  Yes Doreene Nest, NP  ibuprofen (ADVIL) 600 MG tablet Take 1 tablet (600 mg total) by mouth every 6 (six) hours. 01/18/22  Yes Linzie Collin, MD  KARIVA 0.15-0.02/0.01 MG (21/5) tablet TAKE 1 TABLET BY MOUTH EVERYDAY AT BEDTIME 09/11/22  Yes Linzie Collin, MD  ondansetron (ZOFRAN-ODT) 4 MG disintegrating tablet Take 1 tablet (4 mg total) by mouth every 8 (eight) hours as needed for nausea or vomiting. 01/03/23  Yes Carlisle Beers, FNP  sertraline (ZOLOFT) 100 MG tablet TAKE 1 TABLET BY MOUTH EVERY DAY 12/24/22  Yes Georganna Skeans, MD    Allergies: Allergies  Allergen Reactions   Fexofenadine Shortness Of Breath and Other (See Comments)    Respiratory Distress (ALLERGY/intolerance)   Imitrex [Sumatriptan] Other (See Comments)    Symptoms worsened   Guaifenesin Other (See Comments)    Dizziness (  intolerance)   Guaifenesin Er Other (See Comments)    Dizziness   Meloxicam Other (See Comments)    Unknown   Naproxen Other (See Comments)    Loss of appiteite   Tomato Rash    Tongue gets sores     Social History:  reports that she has quit smoking. Her smoking use included cigarettes. She smoked an average of .25 packs per day. She has been exposed to tobacco smoke. She has never used smokeless tobacco. She reports that she does not currently use  alcohol. She reports that she does not use drugs.   Family History: Family History  Problem Relation Age of Onset   Migraines Mother    Diabetes Mother    Hypertension Father    Diabetes Father    Alzheimer's disease Maternal Grandmother    Diabetes Paternal Grandmother        family history   Heart failure Paternal Grandfather    Diabetes Paternal Grandfather     Review of Systems: Review of Systems  Constitutional:  Positive for fever. Negative for chills.  HENT:  Negative for hearing loss.   Respiratory:  Negative for shortness of breath.   Cardiovascular:  Negative for chest pain.  Gastrointestinal:  Positive for abdominal pain and nausea. Negative for vomiting.  Genitourinary:  Negative for dysuria.  Musculoskeletal:  Positive for back pain. Negative for myalgias.  Skin:  Negative for rash.  Neurological:  Negative for dizziness.  Psychiatric/Behavioral:  Negative for depression.     Physical Exam BP 119/84   Pulse 93   Temp 98 F (36.7 C)   Ht 5\' 7"  (1.702 m)   Wt 171 lb (77.6 kg)   LMP 01/14/2023 (Exact Date)   SpO2 97%   BMI 26.78 kg/m  CONSTITUTIONAL: No acute distress, well-nourished HEENT:  Normocephalic, atraumatic, extraocular motion intact. NECK: Trachea is midline, and there is no jugular venous distension.  RESPIRATORY:  Lungs are clear, and breath sounds are equal bilaterally. Normal respiratory effort without pathologic use of accessory muscles. CARDIOVASCULAR: Heart is regular without murmurs, gallops, or rubs. GI: The abdomen is soft, nondistended, currently nontender to palpation.  Negative Murphy's sign.  MUSCULOSKELETAL:  Normal muscle strength and tone in all four extremities.  No peripheral edema or cyanosis. SKIN: Skin turgor is normal. There are no pathologic skin lesions.  NEUROLOGIC:  Motor and sensation is grossly normal.  Cranial nerves are grossly intact. PSYCH:  Alert and oriented to person, place and time. Affect is  normal.  Laboratory Analysis: Labs from 12/18/2022: Sodium 140, potassium 4, chloride 101, CO2 29, BUN 14, creatinine 0.78.  Total bilirubin 0.4, AST 17, ALT 16, alkaline phosphatase 40, albumin 4.2.  WBC 7.5, hemoglobin 12.8, hematocrit 38.3, platelets 309.  Imaging: Ultrasound RUQ on 12/27/2022: IMPRESSION: Cholelithiasis without secondary signs of acute cholecystitis.  Assessment and Plan: This is a 27 y.o. female with symptomatic cholelithiasis.  - Discussed with the patient the function of the gallbladder and how gallstones can create these episodes of biliary colic.  Her ultrasound showed cholelithiasis but no evidence of cholecystitis.  The patient started having some symptoms during her last pregnancy but now more recently her symptoms have become more frequent and more significant.  Even eating simple foods like fruit can cause symptoms.  Discussed with the patient that cholecystectomy would be the recommendation.  I do not think a low-fat diet may be of much help given that even fruit can cause symptoms more recently.  She is in agreement. -  Discussed with the patient then the plan for robotic assisted cholecystectomy.  Reviewed the surgery at length with her including the planned incisions, risks of bleeding, infection, injury to surrounding structures, that this would be an outpatient procedure, use of ICG to better evaluate the biliary anatomy, postoperative activity restrictions, pain control, and she is willing to proceed. - We will schedule the patient for surgery on 02/21/2023.  All of her questions have been answered.  I spent 55 minutes dedicated to the care of this patient on the date of this encounter to include pre-visit review of records, face-to-face time with the patient discussing diagnosis and management, and any post-visit coordination of care.   Howie Ill, MD [facility]

## 2023-02-08 NOTE — Patient Instructions (Signed)
You have requested to have your gallbladder removed. This will be done at [facility] with Dr. Piscoya.  You will most likely be out of work 1-2 weeks for this surgery.  If you have FMLA or disability paperwork that needs filled out you may drop this off at our office or this can be faxed to [phone].  You will return after your post-op appointment with a lifting restriction for approximately 4 more weeks.  You will be able to eat anything you would like to following surgery. But, start by eating a bland diet and advance this as tolerated. The Gallbladder diet is below, please go as closely by this diet as possible prior to surgery to avoid any further attacks.  Please see the (blue)pre-care form that you have been given today. Our surgery scheduler will call you to verify surgery date and to go over information.   If you have any questions, please call our office.  Laparoscopic Cholecystectomy Laparoscopic cholecystectomy is surgery to remove the gallbladder. The gallbladder is located in the upper right part of the abdomen, behind the liver. It is a storage sac for bile, which is produced in the liver. Bile aids in the digestion and absorption of fats. Cholecystectomy is often done for inflammation of the gallbladder (cholecystitis). This condition is usually caused by a buildup of gallstones (cholelithiasis) in the gallbladder. Gallstones can block the flow of bile, and that can result in inflammation and pain. In severe cases, emergency surgery may be required. If emergency surgery is not required, you will have time to prepare for the procedure. Laparoscopic surgery is an alternative to open surgery. Laparoscopic surgery has a shorter recovery time. Your common bile duct may also need to be examined during the procedure. If stones are found in the common bile duct, they may be removed. LET YOUR HEALTH CARE PROVIDER KNOW ABOUT: Any allergies you have. All medicines you are taking,  including vitamins, herbs, eye drops, creams, and over-the-counter medicines. Previous problems you or members of your family have had with the use of anesthetics. Any blood disorders you have. Previous surgeries you have had.  Any medical conditions you have. RISKS AND COMPLICATIONS Generally, this is a safe procedure. However, problems may occur, including: Infection. Bleeding. Allergic reactions to medicines. Damage to other structures or organs. A stone remaining in the common bile duct. A bile leak from the cyst duct that is clipped when your gallbladder is removed. The need to convert to open surgery, which requires a larger incision in the abdomen. This may be necessary if your surgeon thinks that it is not safe to continue with a laparoscopic procedure. BEFORE THE PROCEDURE Ask your health care provider about: Changing or stopping your regular medicines. This is especially important if you are taking diabetes medicines or blood thinners. Taking medicines such as aspirin and ibuprofen. These medicines can thin your blood. Do not take these medicines before your procedure if your health care provider instructs you not to. Follow instructions from your health care provider about eating or drinking restrictions. Let your health care provider know if you develop a cold or an infection before surgery. Plan to have someone take you home after the procedure. Ask your health care provider how your surgical site will be marked or identified. You may be given antibiotic medicine to help prevent infection. PROCEDURE To reduce your risk of infection: Your health care team will wash or sanitize their hands. Your skin will be washed with soap. An IV   tube may be inserted into one of your veins. You will be given a medicine to make you fall asleep (general anesthetic). A breathing tube will be placed in your mouth. The surgeon will make several small cuts (incisions) in your abdomen. A thin,  lighted tube (laparoscope) that has a tiny camera on the end will be inserted through one of the small incisions. The camera on the laparoscope will send a picture to a TV screen (monitor) in the operating room. This will give the surgeon a good view inside your abdomen. A gas will be pumped into your abdomen. This will expand your abdomen to give the surgeon more room to perform the surgery. Other tools that are needed for the procedure will be inserted through the other incisions. The gallbladder will be removed through one of the incisions. After your gallbladder has been removed, the incisions will be closed with stitches (sutures), staples, or skin glue. Your incisions may be covered with a bandage (dressing). The procedure may vary among health care providers and hospitals. AFTER THE PROCEDURE Your blood pressure, heart rate, breathing rate, and blood oxygen level will be monitored often until the medicines you were given have worn off. You will be given medicines as needed to control your pain.   This information is not intended to replace advice given to you by your health care provider. Make sure you discuss any questions you have with your health care provider.   Document Released: 10/01/2005 Document Revised: 06/22/2015 Document Reviewed: 05/13/2013 Elsevier Interactive Patient Education 2016 [employer]   Low-Fat Diet for Gallbladder Conditions A low-fat diet can be helpful if you have pancreatitis or a gallbladder condition. With these conditions, your pancreas and gallbladder have trouble digesting fats. A healthy eating plan with less fat will help rest your pancreas and gallbladder and reduce your symptoms. WHAT DO I NEED TO KNOW ABOUT THIS DIET? Eat a low-fat diet. Reduce your fat intake to less than 20-30% of your total daily calories. This is less than 50-60 g of fat per day. Remember that you need some fat in your diet. Ask your dietician what your daily goal should  be. Choose nonfat and low-fat healthy foods. Look for the words "nonfat," "low fat," or "fat free." As a guide, look on the label and choose foods with less than 3 g of fat per serving. Eat only one serving. Avoid alcohol. Do not smoke. If you need help quitting, talk with your health care provider. Eat small frequent meals instead of three large heavy meals. WHAT FOODS CAN I EAT? Grains Include healthy grains and starches such as potatoes, wheat bread, fiber-rich cereal, and brown rice. Choose whole grain options whenever possible. In adults, whole grains should account for 45-65% of your daily calories.  Fruits and Vegetables Eat plenty of fruits and vegetables. Fresh fruits and vegetables add fiber to your diet. Meats and Other Protein Sources Eat lean meat such as chicken and pork. Trim any fat off of meat before cooking it. Eggs, fish, and beans are other sources of protein. In adults, these foods should account for 10-35% of your daily calories. Dairy Choose low-fat milk and dairy options. Dairy includes fat and protein, as well as calcium.  Fats and Oils Limit high-fat foods such as fried foods, sweets, baked goods, sugary drinks.  Other Creamy sauces and condiments, such as mayonnaise, can add extra fat. Think about whether or not you need to use them, or use smaller amounts or low fat options.   WHAT FOODS ARE NOT RECOMMENDED? High fat foods, such as: Baked goods. Ice cream. French toast. Sweet rolls. Pizza. Cheese bread. Foods covered with batter, butter, creamy sauces, or cheese. Fried foods. Sugary drinks and desserts. Foods that cause gas or bloating   This information is not intended to replace advice given to you by your health care provider. Make sure you discuss any questions you have with your health care provider.   Document Released: 10/06/2013 Document Reviewed: 10/06/2013 Elsevier Interactive Patient Education 2016 Elsevier Inc.   

## 2023-02-08 NOTE — H&P (View-Only) (Signed)
02/08/2023  Reason for Visit:  Symptomatic cholelithiasis  Requesting Provider:  Katherine Clark, NP  History of Present Illness: Holly Meyer is a 26 y.o. female presenting for evaluation of symptomatic cholelithiasis.  The patient reports that during her last pregnancy, she developed abdominal pain and was told that it could be her gallbladder although I do not see any imaging that had been done by then of her right upper quadrant.  She reports after pregnancy she was doing better but now more recently has developed again abdominal discomfort.  The pain is located in the epigastric area and radiates to her back.  The pain is accompanied by nausea but no emesis.  Denies any fevers or chills.  At first the episodes were very infrequent but now they are becoming much more frequent about 4 times per week.  This can happen even when eating fruits but certainly if she eats anything greasy.  She is currently recovering from episode of strep pharyngitis.  She is taking her antibiotics as instructed.  She did have fevers recently but she attributes this to the strep throat.  Past Medical History: Past Medical History:  Diagnosis Date   Anemia    Complication of anesthesia    complaints of nausea and vomitting when wisdom teeth were removed   FHx: diabetes mellitus    Headache    History of miscarriage, currently pregnant, first trimester 08/28/2017   Patient desires vaginal birth after cesarean section (VBAC) 08/15/2021   Septate uterus    Term pregnancy, repeat 01/15/2022   Venous malformation    Right paraspinal     Past Surgical History: Past Surgical History:  Procedure Laterality Date   CESAREAN SECTION N/A 03/25/2018   Procedure: PRIMARY CESAREAN SECTION;  Surgeon: Evans, David James, MD;  Location: [facility] ORS;  Service: Obstetrics;  Laterality: N/A;  Female born @ 1020     CESAREAN SECTION N/A 01/15/2022   Procedure: CESAREAN SECTION;  Surgeon: Evans, David James, MD;  Location:  [facility] ORS;  Service: Obstetrics;  Laterality: N/A;   DILATION AND EVACUATION N/A 01/07/2017   Procedure: DILATATION AND EVACUATION;  Surgeon: David James Evans, MD;  Location: [facility] ORS;  Service: Gynecology;  Laterality: N/A;   WISDOM TOOTH EXTRACTION  2012    Home Medications: Prior to Admission medications   Medication Sig Start Date End Date Taking? Authorizing Provider  acetaminophen (TYLENOL) 500 MG tablet Take 500 mg by mouth every 6 (six) hours as needed for moderate pain or headache.    Yes [provider]  amoxicillin-clavulanate (AUGMENTIN) 875-125 MG tablet Take 1 tablet by mouth 2 (two) times daily. 02/01/23  Yes Clark, Katherine K, NP  ibuprofen (ADVIL) 600 MG tablet Take 1 tablet (600 mg total) by mouth every 6 (six) hours. 01/18/22  Yes Evans, David James, MD  KARIVA 0.15-0.02/0.01 MG (21/5) tablet TAKE 1 TABLET BY MOUTH EVERYDAY AT BEDTIME 09/11/22  Yes Evans, David James, MD  ondansetron (ZOFRAN-ODT) 4 MG disintegrating tablet Take 1 tablet (4 mg total) by mouth every 8 (eight) hours as needed for nausea or vomiting. 01/03/23  Yes Stanhope, Catharine M, FNP  sertraline (ZOLOFT) 100 MG tablet TAKE 1 TABLET BY MOUTH EVERY DAY 12/24/22  Yes Wilson, Amelia, MD    Allergies: Allergies  Allergen Reactions   Fexofenadine Shortness Of Breath and Other (See Comments)    Respiratory Distress (ALLERGY/intolerance)   Imitrex [Sumatriptan] Other (See Comments)    Symptoms worsened   Guaifenesin Other (See Comments)    Dizziness (  intolerance)   Guaifenesin Er Other (See Comments)    Dizziness   Meloxicam Other (See Comments)    Unknown   Naproxen Other (See Comments)    Loss of appiteite   Tomato Rash    Tongue gets sores     Social History:  reports that she has quit smoking. Her smoking use included cigarettes. She smoked an average of .25 packs per day. She has been exposed to tobacco smoke. She has never used smokeless tobacco. She reports that she does not currently use  alcohol. She reports that she does not use drugs.   Family History: Family History  Problem Relation Age of Onset   Migraines Mother    Diabetes Mother    Hypertension Father    Diabetes Father    Alzheimer's disease Maternal Grandmother    Diabetes Paternal Grandmother        family history   Heart failure Paternal Grandfather    Diabetes Paternal Grandfather     Review of Systems: Review of Systems  Constitutional:  Positive for fever. Negative for chills.  HENT:  Negative for hearing loss.   Respiratory:  Negative for shortness of breath.   Cardiovascular:  Negative for chest pain.  Gastrointestinal:  Positive for abdominal pain and nausea. Negative for vomiting.  Genitourinary:  Negative for dysuria.  Musculoskeletal:  Positive for back pain. Negative for myalgias.  Skin:  Negative for rash.  Neurological:  Negative for dizziness.  Psychiatric/Behavioral:  Negative for depression.     Physical Exam BP 119/84   Pulse 93   Temp 98 F (36.7 C)   Ht 5' 7" (1.702 m)   Wt 171 lb (77.6 kg)   LMP 01/14/2023 (Exact Date)   SpO2 97%   BMI 26.78 kg/m  CONSTITUTIONAL: No acute distress, well-nourished HEENT:  Normocephalic, atraumatic, extraocular motion intact. NECK: Trachea is midline, and there is no jugular venous distension.  RESPIRATORY:  Lungs are clear, and breath sounds are equal bilaterally. Normal respiratory effort without pathologic use of accessory muscles. CARDIOVASCULAR: Heart is regular without murmurs, gallops, or rubs. GI: The abdomen is soft, nondistended, currently nontender to palpation.  Negative Murphy's sign.  MUSCULOSKELETAL:  Normal muscle strength and tone in all four extremities.  No peripheral edema or cyanosis. SKIN: Skin turgor is normal. There are no pathologic skin lesions.  NEUROLOGIC:  Motor and sensation is grossly normal.  Cranial nerves are grossly intact. PSYCH:  Alert and oriented to person, place and time. Affect is  normal.  Laboratory Analysis: Labs from 12/18/2022: Sodium 140, potassium 4, chloride 101, CO2 29, BUN 14, creatinine 0.78.  Total bilirubin 0.4, AST 17, ALT 16, alkaline phosphatase 40, albumin 4.2.  WBC 7.5, hemoglobin 12.8, hematocrit 38.3, platelets 309.  Imaging: Ultrasound RUQ on 12/27/2022: IMPRESSION: Cholelithiasis without secondary signs of acute cholecystitis.  Assessment and Plan: This is a 26 y.o. female with symptomatic cholelithiasis.  - Discussed with the patient the function of the gallbladder and how gallstones can create these episodes of biliary colic.  Her ultrasound showed cholelithiasis but no evidence of cholecystitis.  The patient started having some symptoms during her last pregnancy but now more recently her symptoms have become more frequent and more significant.  Even eating simple foods like fruit can cause symptoms.  Discussed with the patient that cholecystectomy would be the recommendation.  I do not think a low-fat diet may be of much help given that even fruit can cause symptoms more recently.  She is in agreement. -   Discussed with the patient then the plan for robotic assisted cholecystectomy.  Reviewed the surgery at length with her including the planned incisions, risks of bleeding, infection, injury to surrounding structures, that this would be an outpatient procedure, use of ICG to better evaluate the biliary anatomy, postoperative activity restrictions, pain control, and she is willing to proceed. - We will schedule the patient for surgery on 02/21/2023.  All of her questions have been answered.  I spent 55 minutes dedicated to the care of this patient on the date of this encounter to include pre-visit review of records, face-to-face time with the patient discussing diagnosis and management, and any post-visit coordination of care.    Luis , MD [facility]    

## 2023-02-11 ENCOUNTER — Telehealth: Payer: Self-pay | Admitting: Surgery

## 2023-02-11 NOTE — Telephone Encounter (Signed)
Patient has been advised of Pre-Admission date/time, and Surgery date at [facility].  Surgery Date: 02/21/23 Preadmission Testing Date: 02/13/23 (phone 1p-4p)  Patient has been made aware to call [phone], between 1-3:00pm the day before surgery, to find out what time to arrive for surgery.

## 2023-02-13 ENCOUNTER — Encounter
Admission: RE | Admit: 2023-02-13 | Discharge: 2023-02-13 | Disposition: A | Discharge status: Home / Self Care | Payer: Medicaid Other | Source: Ambulatory Visit | Attending: Surgery | Admitting: Surgery

## 2023-02-13 VITALS — Ht 67.0 in | Wt 171.0 lb

## 2023-02-13 DIAGNOSIS — Z01812 Encounter for preprocedural laboratory examination: Secondary | ICD-10-CM

## 2023-02-13 HISTORY — DX: Other specified postprocedural states: Z98.890

## 2023-02-13 HISTORY — DX: Gastro-esophageal reflux disease without esophagitis: K21.9

## 2023-02-13 HISTORY — DX: Nausea with vomiting, unspecified: R11.2

## 2023-02-13 NOTE — Patient Instructions (Addendum)
Your procedure is scheduled on: Thursday, May 9 Report to the Registration Desk on the 1st floor of the [facility] To find out your arrival time, please call [phone] between 1PM - 3PM on: Wednesday, May 8 If your arrival time is 6:00 am, do not arrive before that time as the [facility] entrance doors do not open until 6:00 am.  REMEMBER: Instructions that are not followed completely may result in serious medical risk, up to and including death; or upon the discretion of your surgeon and anesthesiologist your surgery may need to be rescheduled.  Do not eat food after midnight the night before surgery.  No gum chewing or hard candies.  You may however, drink CLEAR liquids up to 2 hours before you are scheduled to arrive for your surgery. Do not drink anything within 2 hours of your scheduled arrival time.  Clear liquids include: - water  - apple juice without pulp - gatorade (not RED colors) - black coffee or tea (Do NOT add milk or creamers to the coffee or tea) Do NOT drink anything that is not on this list.  One week prior to surgery: starting May 2 Stop Anti-inflammatories (NSAIDS) such as Advil, Aleve, Ibuprofen, Motrin, Naproxen, Naprosyn and Aspirin based products such as Excedrin, Goody's Powder, BC Powder. Stop ANY OVER THE COUNTER supplements until after surgery. You may however, continue to take Tylenol if needed for pain up until the day of surgery.  Continue taking all prescribed medications   TAKE ONLY THESE MEDICATIONS THE MORNING OF SURGERY WITH A SIP OF WATER:  Sertraline (Zoloft)  No Alcohol for 24 hours before or after surgery.  No Smoking including e-cigarettes for 24 hours before surgery.  No chewable tobacco products for at least 6 hours before surgery.  No nicotine patches on the day of surgery.  Do not use any "recreational" drugs for at least a week (preferably 2 weeks) before your surgery.  Please be advised that the combination of cocaine and  anesthesia may have negative outcomes, up to and including death. If you test positive for cocaine, your surgery will be cancelled.  On the morning of surgery brush your teeth with toothpaste and water, you may rinse your mouth with mouthwash if you wish. Do not swallow any toothpaste or mouthwash.  Use CHG Soap as directed on instruction sheet.  Do not wear jewelry, make-up, hairpins, clips or nail polish.  Do not wear lotions, powders, or perfumes.   Do not shave body hair from the neck down 48 hours before surgery.  Contact lenses, hearing aids and dentures may not be worn into surgery.  Do not bring valuables to the hospital. [facility] is not responsible for any missing/lost belongings or valuables.   Notify your doctor if there is any change in your medical condition (cold, fever, infection).  Wear comfortable clothing (specific to your surgery type) to the hospital.  After surgery, you can help prevent lung complications by doing breathing exercises.  Take deep breaths and cough every 1-2 hours. Your doctor may order a device called an Incentive Spirometer to help you take deep breaths. When coughing or sneezing, hold a pillow firmly against your incision with both hands. This is called "splinting." Doing this helps protect your incision. It also decreases belly discomfort.  If you are being discharged the day of surgery, you will not be allowed to drive home. You will need a responsible individual to drive you home and stay with you for 24 hours  after surgery.   If you are taking public transportation, you will need to have a responsible individual with you.  Please call the Pre-admissions Testing Dept. at [phone] if you have any questions about these instructions.  Surgery Visitation Policy:  Patients having surgery or a procedure may have two visitors.  Children under the age of 57 must have an adult with them who is not the patient.     Preparing for  Surgery with CHLORHEXIDINE GLUCONATE (CHG) Soap  Chlorhexidine Gluconate (CHG) Soap  o An antiseptic cleaner that kills germs and bonds with the skin to continue killing germs even after washing  o Used for showering the night before surgery and morning of surgery  Before surgery, you can play an important role by reducing the number of germs on your skin.  CHG (Chlorhexidine gluconate) soap is an antiseptic cleanser which kills germs and bonds with the skin to continue killing germs even after washing.  Please do not use if you have an allergy to CHG or antibacterial soaps. If your skin becomes reddened/irritated stop using the CHG.  1. Shower the NIGHT BEFORE SURGERY and the MORNING OF SURGERY with CHG soap.  2. If you choose to wash your hair, wash your hair first as usual with your normal shampoo.  3. After shampooing, rinse your hair and body thoroughly to remove the shampoo.  4. Use CHG as you would any other liquid soap. You can apply CHG directly to the skin and wash gently with a scrungie or a clean washcloth.  5. Apply the CHG soap to your body only from the neck down. Do not use on open wounds or open sores. Avoid contact with your eyes, ears, mouth, and genitals (private parts). Wash face and genitals (private parts) with your normal soap.  6. Wash thoroughly, paying special attention to the area where your surgery will be performed.  7. Thoroughly rinse your body with warm water.  8. Do not shower/wash with your normal soap after using and rinsing off the CHG soap.  9. Pat yourself dry with a clean towel.  10. Wear clean pajamas to bed the night before surgery.  12. Place clean sheets on your bed the night of your first shower and do not sleep with pets.  13. Shower again with the CHG soap on the day of surgery prior to arriving at the hospital.  14. Do not apply any deodorants/lotions/powders.  15. Please wear clean clothes to the hospital.

## 2023-02-14 DIAGNOSIS — J0301 Acute recurrent streptococcal tonsillitis: Secondary | ICD-10-CM | POA: Diagnosis not present

## 2023-02-14 DIAGNOSIS — H6122 Impacted cerumen, left ear: Secondary | ICD-10-CM | POA: Diagnosis not present

## 2023-02-14 DIAGNOSIS — J020 Streptococcal pharyngitis: Secondary | ICD-10-CM | POA: Diagnosis not present

## 2023-02-21 ENCOUNTER — Ambulatory Visit
Admission: RE | Admit: 2023-02-21 | Discharge: 2023-02-21 | Disposition: A | Discharge status: Home / Self Care | Payer: Medicaid Other | Attending: Surgery | Admitting: Surgery

## 2023-02-21 ENCOUNTER — Ambulatory Visit: Payer: Medicaid Other | Admitting: Certified Registered"

## 2023-02-21 ENCOUNTER — Other Ambulatory Visit: Payer: Self-pay

## 2023-02-21 ENCOUNTER — Encounter: Payer: Self-pay | Admitting: Surgery

## 2023-02-21 ENCOUNTER — Encounter
Admission: RE | Disposition: A | Discharge status: Home / Self Care | Payer: Self-pay | Source: Home / Self Care | Attending: Surgery

## 2023-02-21 DIAGNOSIS — K8010 Calculus of gallbladder with chronic cholecystitis without obstruction: Secondary | ICD-10-CM | POA: Diagnosis not present

## 2023-02-21 DIAGNOSIS — Z09 Encounter for follow-up examination after completed treatment for conditions other than malignant neoplasm: Secondary | ICD-10-CM | POA: Insufficient documentation

## 2023-02-21 DIAGNOSIS — K219 Gastro-esophageal reflux disease without esophagitis: Secondary | ICD-10-CM | POA: Diagnosis not present

## 2023-02-21 DIAGNOSIS — Z87891 Personal history of nicotine dependence: Secondary | ICD-10-CM | POA: Diagnosis not present

## 2023-02-21 DIAGNOSIS — K8020 Calculus of gallbladder without cholecystitis without obstruction: Secondary | ICD-10-CM

## 2023-02-21 DIAGNOSIS — F418 Other specified anxiety disorders: Secondary | ICD-10-CM | POA: Diagnosis not present

## 2023-02-21 DIAGNOSIS — Z01812 Encounter for preprocedural laboratory examination: Secondary | ICD-10-CM

## 2023-02-21 LAB — POCT PREGNANCY, URINE: Preg Test, Ur: NEGATIVE

## 2023-02-21 SURGERY — CHOLECYSTECTOMY, ROBOT-ASSISTED, LAPAROSCOPIC
Anesthesia: General | Site: Abdomen

## 2023-02-21 MED ORDER — EPINEPHRINE PF 1 MG/ML IJ SOLN
INTRAMUSCULAR | Status: AC
  Filled 2023-02-21: qty 1

## 2023-02-21 MED ORDER — FENTANYL CITRATE (PF) 100 MCG/2ML IJ SOLN
INTRAMUSCULAR | Status: AC
  Filled 2023-02-21: qty 2

## 2023-02-21 MED ORDER — BUPIVACAINE LIPOSOME 1.3 % IJ SUSP
INTRAMUSCULAR | Status: AC
  Filled 2023-02-21: qty 10

## 2023-02-21 MED ORDER — LACTATED RINGERS IV SOLN: INTRAVENOUS | Status: DC

## 2023-02-21 MED ORDER — CHLORHEXIDINE GLUCONATE 0.12 % MT SOLN
15.0000 mL | Freq: Once | OROMUCOSAL | Status: AC
  Administered 2023-02-21: 15 mL via OROMUCOSAL

## 2023-02-21 MED ORDER — LIDOCAINE HCL (CARDIAC) PF 100 MG/5ML IV SOSY
PREFILLED_SYRINGE | INTRAVENOUS | Status: DC | PRN
  Administered 2023-02-21: 60 mg via INTRAVENOUS

## 2023-02-21 MED ORDER — EPHEDRINE 5 MG/ML INJ
INTRAVENOUS | Status: AC
  Filled 2023-02-21: qty 5

## 2023-02-21 MED ORDER — EPHEDRINE SULFATE (PRESSORS) 50 MG/ML IJ SOLN
INTRAMUSCULAR | Status: DC | PRN
  Administered 2023-02-21: 5 mg via INTRAVENOUS

## 2023-02-21 MED ORDER — MIDAZOLAM HCL 2 MG/2ML IJ SOLN
INTRAMUSCULAR | Status: DC | PRN
  Administered 2023-02-21: 2 mg via INTRAVENOUS

## 2023-02-21 MED ORDER — ONDANSETRON HCL 4 MG/2ML IJ SOLN
INTRAMUSCULAR | Status: AC
  Filled 2023-02-21: qty 2

## 2023-02-21 MED ORDER — BUPIVACAINE HCL (PF) 0.25 % IJ SOLN
INTRAMUSCULAR | Status: AC
  Filled 2023-02-21: qty 30

## 2023-02-21 MED ORDER — HYDROMORPHONE HCL 1 MG/ML IJ SOLN
INTRAMUSCULAR | Status: DC | PRN
  Administered 2023-02-21 (×2): .5 mg via INTRAVENOUS

## 2023-02-21 MED ORDER — FENTANYL CITRATE (PF) 100 MCG/2ML IJ SOLN
INTRAMUSCULAR | Status: DC | PRN
  Administered 2023-02-21 (×2): 50 ug via INTRAVENOUS

## 2023-02-21 MED ORDER — CHLORHEXIDINE GLUCONATE 0.12 % MT SOLN
OROMUCOSAL | Status: AC
  Filled 2023-02-21: qty 15

## 2023-02-21 MED ORDER — FAMOTIDINE 20 MG PO TABS
20.0000 mg | ORAL_TABLET | Freq: Once | ORAL | Status: AC
  Administered 2023-02-21: 20 mg via ORAL

## 2023-02-21 MED ORDER — 0.9 % SODIUM CHLORIDE (POUR BTL) OPTIME
TOPICAL | Status: DC | PRN
  Administered 2023-02-21: 500 mL

## 2023-02-21 MED ORDER — ONDANSETRON HCL 4 MG/2ML IJ SOLN
INTRAMUSCULAR | Status: DC | PRN
  Administered 2023-02-21: 4 mg via INTRAVENOUS

## 2023-02-21 MED ORDER — DEXAMETHASONE SODIUM PHOSPHATE 10 MG/ML IJ SOLN
INTRAMUSCULAR | Status: AC
  Filled 2023-02-21: qty 1

## 2023-02-21 MED ORDER — OXYCODONE HCL 5 MG PO TABS
ORAL_TABLET | ORAL | Status: AC
  Filled 2023-02-21: qty 1

## 2023-02-21 MED ORDER — FENTANYL CITRATE (PF) 100 MCG/2ML IJ SOLN
25.0000 ug | INTRAMUSCULAR | Status: DC | PRN
  Administered 2023-02-21: 25 ug via INTRAVENOUS
  Administered 2023-02-21: 50 ug via INTRAVENOUS
  Administered 2023-02-21 (×3): 25 ug via INTRAVENOUS

## 2023-02-21 MED ORDER — IBUPROFEN 800 MG PO TABS
800.0000 mg | ORAL_TABLET | Freq: Three times a day (TID) | ORAL | 1 refills | Status: AC | PRN
Start: 2023-02-21 — End: ?

## 2023-02-21 MED ORDER — ROCURONIUM BROMIDE 100 MG/10ML IV SOLN
INTRAVENOUS | Status: DC | PRN
  Administered 2023-02-21: 20 mg via INTRAVENOUS
  Administered 2023-02-21: 50 mg via INTRAVENOUS

## 2023-02-21 MED ORDER — LIDOCAINE HCL (PF) 2 % IJ SOLN
INTRAMUSCULAR | Status: AC
  Filled 2023-02-21: qty 5

## 2023-02-21 MED ORDER — ORAL CARE MOUTH RINSE: 15.0000 mL | Freq: Once | OROMUCOSAL | Status: AC

## 2023-02-21 MED ORDER — FAMOTIDINE 20 MG PO TABS
ORAL_TABLET | ORAL | Status: AC
  Filled 2023-02-21: qty 1

## 2023-02-21 MED ORDER — ACETAMINOPHEN 500 MG PO TABS
ORAL_TABLET | ORAL | Status: AC
  Filled 2023-02-21: qty 2

## 2023-02-21 MED ORDER — OXYCODONE HCL 5 MG PO TABS
5.0000 mg | ORAL_TABLET | Freq: Once | ORAL | Status: AC | PRN
  Administered 2023-02-21: 5 mg via ORAL

## 2023-02-21 MED ORDER — DEXAMETHASONE SODIUM PHOSPHATE 10 MG/ML IJ SOLN
INTRAMUSCULAR | Status: DC | PRN
  Administered 2023-02-21: 5 mg via INTRAVENOUS

## 2023-02-21 MED ORDER — GABAPENTIN 300 MG PO CAPS
300.0000 mg | ORAL_CAPSULE | ORAL | Status: AC
  Administered 2023-02-21: 300 mg via ORAL

## 2023-02-21 MED ORDER — ACETAMINOPHEN 500 MG PO TABS: 1000.0000 mg | ORAL_TABLET | Freq: Four times a day (QID) | ORAL | Status: AC | PRN

## 2023-02-21 MED ORDER — SUGAMMADEX SODIUM 200 MG/2ML IV SOLN
INTRAVENOUS | Status: DC | PRN
  Administered 2023-02-21: 200 mg via INTRAVENOUS

## 2023-02-21 MED ORDER — PROPOFOL 10 MG/ML IV BOLUS
INTRAVENOUS | Status: DC | PRN
  Administered 2023-02-21: 200 mg via INTRAVENOUS

## 2023-02-21 MED ORDER — ACETAMINOPHEN 500 MG PO TABS
1000.0000 mg | ORAL_TABLET | ORAL | Status: AC
  Administered 2023-02-21: 1000 mg via ORAL

## 2023-02-21 MED ORDER — GABAPENTIN 300 MG PO CAPS
ORAL_CAPSULE | ORAL | Status: AC
  Filled 2023-02-21: qty 1

## 2023-02-21 MED ORDER — CHLORHEXIDINE GLUCONATE CLOTH 2 % EX PADS: 6.0000 | MEDICATED_PAD | Freq: Once | CUTANEOUS | Status: DC

## 2023-02-21 MED ORDER — OXYCODONE HCL 5 MG/5ML PO SOLN: 5.0000 mg | Freq: Once | ORAL | Status: AC | PRN

## 2023-02-21 MED ORDER — DIPHENHYDRAMINE HCL 50 MG/ML IJ SOLN
INTRAMUSCULAR | Status: DC | PRN
  Administered 2023-02-21: 12.5 mg via INTRAVENOUS

## 2023-02-21 MED ORDER — INDOCYANINE GREEN 25 MG IV SOLR
2.5000 mg | INTRAVENOUS | Status: AC
  Administered 2023-02-21: 2.5 mg via INTRAVENOUS
  Filled 2023-02-21: qty 1

## 2023-02-21 MED ORDER — BUPIVACAINE-EPINEPHRINE (PF) 0.25% -1:200000 IJ SOLN
INTRAMUSCULAR | Status: DC | PRN
  Administered 2023-02-21: 40 mL via INTRAMUSCULAR

## 2023-02-21 MED ORDER — MIDAZOLAM HCL 2 MG/2ML IJ SOLN
INTRAMUSCULAR | Status: AC
  Filled 2023-02-21: qty 2

## 2023-02-21 MED ORDER — PROPOFOL 10 MG/ML IV BOLUS
INTRAVENOUS | Status: AC
  Filled 2023-02-21: qty 20

## 2023-02-21 MED ORDER — CEFAZOLIN SODIUM-DEXTROSE 2-4 GM/100ML-% IV SOLN
2.0000 g | INTRAVENOUS | Status: AC
  Administered 2023-02-21: 2 g via INTRAVENOUS

## 2023-02-21 MED ORDER — DIPHENHYDRAMINE HCL 50 MG/ML IJ SOLN
INTRAMUSCULAR | Status: AC
  Filled 2023-02-21: qty 1

## 2023-02-21 MED ORDER — HYDROMORPHONE HCL 1 MG/ML IJ SOLN
INTRAMUSCULAR | Status: AC
  Filled 2023-02-21: qty 1

## 2023-02-21 MED ORDER — OXYCODONE HCL 5 MG PO TABS
5.0000 mg | ORAL_TABLET | ORAL | 0 refills | Status: DC | PRN
Start: 2023-02-21 — End: 2023-03-07

## 2023-02-21 SURGICAL SUPPLY — 50 items
ADH SKN CLS APL DERMABOND .7 (GAUZE/BANDAGES/DRESSINGS) ×2
BAG PRESSURE INF REUSE 1000 (BAG) IMPLANT
CANNULA CAP OBTURATR AIRSEAL 8 (CAP) IMPLANT
CAUTERY HOOK MNPLR 1.6 DVNC XI (INSTRUMENTS) ×2 IMPLANT
CLIP LIGATING HEMO O LOK GREEN (MISCELLANEOUS) ×2 IMPLANT
DERMABOND ADVANCED .7 DNX12 (GAUZE/BANDAGES/DRESSINGS) ×2 IMPLANT
DRAPE ARM DVNC X/XI (DISPOSABLE) ×8 IMPLANT
DRAPE COLUMN DVNC XI (DISPOSABLE) ×2 IMPLANT
ELECT CAUTERY BLADE TIP 2.5 (TIP) ×2
ELECT REM PT RETURN 9FT ADLT (ELECTROSURGICAL) ×2
ELECTRODE CAUTERY BLDE TIP 2.5 (TIP) ×2 IMPLANT
ELECTRODE REM PT RTRN 9FT ADLT (ELECTROSURGICAL) ×2 IMPLANT
FORCEPS BPLR R/ABLATION 8 DVNC (INSTRUMENTS) ×2 IMPLANT
FORCEPS PROGRASP DVNC XI (FORCEP) ×2 IMPLANT
GLOVE SURG SYN 7.0 (GLOVE) ×4 IMPLANT
GLOVE SURG SYN 7.0 PF PI (GLOVE) ×4 IMPLANT
GLOVE SURG SYN 7.5  E (GLOVE) ×4
GLOVE SURG SYN 7.5 E (GLOVE) ×4 IMPLANT
GLOVE SURG SYN 7.5 PF PI (GLOVE) ×4 IMPLANT
GOWN STRL REUS W/ TWL LRG LVL3 (GOWN DISPOSABLE) ×8 IMPLANT
GOWN STRL REUS W/TWL LRG LVL3 (GOWN DISPOSABLE) ×8
IRRIGATOR SUCT 8 DISP DVNC XI (IRRIGATION / IRRIGATOR) IMPLANT
IV NS 1000ML (IV SOLUTION)
IV NS 1000ML BAXH (IV SOLUTION) IMPLANT
KIT PINK PAD W/HEAD ARE REST (MISCELLANEOUS) ×2
KIT PINK PAD W/HEAD ARM REST (MISCELLANEOUS) ×2 IMPLANT
LABEL OR SOLS (LABEL) ×2 IMPLANT
MANIFOLD NEPTUNE II (INSTRUMENTS) ×2 IMPLANT
NDL HYPO 22X1.5 SAFETY MO (MISCELLANEOUS) ×2 IMPLANT
NEEDLE HYPO 22X1.5 SAFETY MO (MISCELLANEOUS) ×2 IMPLANT
NS IRRIG 500ML POUR BTL (IV SOLUTION) ×2 IMPLANT
OBTURATOR OPTICAL STND 8 DVNC (TROCAR) ×2
OBTURATOR OPTICALSTD 8 DVNC (TROCAR) ×2 IMPLANT
PACK LAP CHOLECYSTECTOMY (MISCELLANEOUS) ×2 IMPLANT
PENCIL SMOKE EVACUATOR (MISCELLANEOUS) ×2 IMPLANT
SEAL UNIV 5-12 XI (MISCELLANEOUS) ×8 IMPLANT
SET TUBE FILTERED XL AIRSEAL (SET/KITS/TRAYS/PACK) IMPLANT
SET TUBE SMOKE EVAC HIGH FLOW (TUBING) ×2 IMPLANT
SOL ELECTROSURG ANTI STICK (MISCELLANEOUS) ×2
SOLUTION ELECTROSURG ANTI STCK (MISCELLANEOUS) ×2 IMPLANT
SPIKE FLUID TRANSFER (MISCELLANEOUS) ×2 IMPLANT
SPONGE T-LAP 18X18 ~~LOC~~+RFID (SPONGE) IMPLANT
SPONGE T-LAP 4X18 ~~LOC~~+RFID (SPONGE) ×2 IMPLANT
SUT MNCRL AB 4-0 PS2 18 (SUTURE) ×2 IMPLANT
SUT VIC AB 3-0 SH 27 (SUTURE) ×2
SUT VIC AB 3-0 SH 27X BRD (SUTURE) IMPLANT
SUT VICRYL 0 UR6 27IN ABS (SUTURE) ×4 IMPLANT
SYS BAG RETRIEVAL 10MM (BASKET) ×2
SYSTEM BAG RETRIEVAL 10MM (BASKET) ×2 IMPLANT
WATER STERILE IRR 500ML POUR (IV SOLUTION) ×2 IMPLANT

## 2023-02-21 NOTE — Op Note (Addendum)
  Procedure Date:  02/21/2023  Pre-operative Diagnosis:  Symptomatic cholelithiasis  Post-operative Diagnosis: Symptomatic cholelithiasis  Procedure:  Robotic assisted cholecystectomy with ICG FireFly cholangiogram  Surgeon:  Howie Ill, MD  Anesthesia:  General endotracheal  Estimated Blood Loss:  20 ml  Specimens:  gallbladder  Complications:  None  Indications for Procedure:  This is a 27 y.o. female who presents with abdominal pain and workup revealing symptomatic cholelithiasis.  The benefits, complications, treatment options, and expected outcomes were discussed with the patient. The risks of bleeding, infection, recurrence of symptoms, failure to resolve symptoms, bile duct damage, bile duct leak, retained common bile duct stone, bowel injury, and need for further procedures were all discussed with the patient and she was willing to proceed.  Description of Procedure: The patient was correctly identified in the preoperative area and brought into the operating room.  The patient was placed supine with VTE prophylaxis in place.  Appropriate time-outs were performed.  Anesthesia was induced and the patient was intubated.  Appropriate antibiotics were infused.  The abdomen was prepped and draped in a sterile fashion. An infraumbilical incision was made. A cutdown technique was used to enter the abdominal cavity without injury, and a 12 mm robotic port was inserted.  Pneumoperitoneum was obtained with appropriate opening pressures.  Three 8-mm ports were placed in the mid abdomen at the level of the umbilicus under direct visualization.  The DaVinci platform was docked, camera targeted, and instruments were placed under direct visualization.  The gallbladder was identified.  The fundus was grasped and retracted cephalad.  Adhesions were lysed bluntly and with electrocautery. The infundibulum was grasped and retracted laterally, exposing the peritoneum overlying the gallbladder.  This  was incised with electrocautery and extended on either side of the gallbladder.  FireFly cholangiogram was then obtained, and we were able to clearly identify the cystic duct and common bile duct.  The was flow of ICG into the liver, common hepatic duct, cystic duct, common bile duct, and into the duodenum without gross obstruction.  The cystic duct and cystic artery were carefully dissected with combination of cautery and blunt dissection.  Both were clipped twice proximally and once distally, cutting in between.  The gallbladder was taken from the gallbladder fossa in a retrograde fashion with electrocautery. The gallbladder was placed in an Endocatch bag. The liver bed was inspected and any bleeding was controlled with electrocautery. The right upper quadrant was then inspected again revealing intact clips, no bleeding, and no ductal injury.  The 8 mm ports were removed under direct visualization and the 12 mm port was removed.  The Endocatch bag was brought out via the umbilical incision. The fascial opening was closed using 0 vicryl suture.  Local anesthetic was infused in all incisions and the incisions were closed with 4-0 Monocryl.  The wounds were cleaned and sealed with DermaBond.  The patient was emerged from anesthesia and extubated and brought to the recovery room for further management.  The patient tolerated the procedure well and all counts were correct at the end of the case.   Howie Ill, MD

## 2023-02-21 NOTE — Transfer of Care (Signed)
Immediate Anesthesia Transfer of Care Note  Patient: Holly Meyer  Procedure(s) Performed: XI ROBOTIC ASSISTED LAPAROSCOPIC CHOLECYSTECTOMY (Abdomen) INDOCYANINE GREEN FLUORESCENCE IMAGING (ICG)  Patient Location: PACU  Anesthesia Type:General  Level of Consciousness: sedated  Airway & Oxygen Therapy: Patient Spontanous Breathing and Patient connected to face mask oxygen  Post-op Assessment: Report given to RN and Post -op Vital signs reviewed and stable  Post vital signs: Reviewed and stable  Last Vitals:  Vitals Value Taken Time  BP 127/92 02/21/23 1137  Temp    Pulse 79 02/21/23 1140  Resp    SpO2 99 % 02/21/23 1140  Vitals shown include unvalidated device data.  Last Pain:  Vitals:   02/21/23 0814  PainSc: 0-No pain         Complications: No notable events documented.

## 2023-02-21 NOTE — Anesthesia Procedure Notes (Signed)
Procedure Name: Intubation Date/Time: 02/21/2023 10:02 AM  Performed by: Monico Hoar, CRNAPre-anesthesia Checklist: Patient identified, Patient being monitored, Timeout performed, Emergency Drugs available and Suction available Patient Re-evaluated:Patient Re-evaluated prior to induction Oxygen Delivery Method: Circle system utilized Preoxygenation: Pre-oxygenation with 100% oxygen Induction Type: IV induction Ventilation: Mask ventilation without difficulty Laryngoscope Size: Mac, McGraph and 4 Grade View: Grade I Tube type: Oral Tube size: 7.0 mm Number of attempts: 1 Airway Equipment and Method: Stylet Placement Confirmation: ETT inserted through vocal cords under direct vision, positive ETCO2 and breath sounds checked- equal and bilateral Secured at: 21 cm Tube secured with: Tape Dental Injury: Teeth and Oropharynx as per pre-operative assessment

## 2023-02-21 NOTE — Anesthesia Preprocedure Evaluation (Signed)
Anesthesia Evaluation  Patient identified by MRN, date of birth, ID band Patient awake    Reviewed: Allergy & Precautions, NPO status , Patient's Chart, lab work & pertinent test results  History of Anesthesia Complications (+) PONV and history of anesthetic complications  Airway Mallampati: II  TM Distance: >3 FB Neck ROM: full    Dental  (+) Chipped   Pulmonary neg shortness of breath, former smoker   Pulmonary exam normal        Cardiovascular Exercise Tolerance: Good (-) angina (-) Past MI and (-) DOE negative cardio ROS Normal cardiovascular exam     Neuro/Psych  Headaches PSYCHIATRIC DISORDERS         GI/Hepatic Neg liver ROS,GERD  Controlled,,  Endo/Other  negative endocrine ROS    Renal/GU      Musculoskeletal   Abdominal   Peds  Hematology negative hematology ROS (+)   Anesthesia Other Findings Past Medical History: No date: Anemia No date: Complication of anesthesia     Comment:  complaints of nausea and vomitting when wisdom teeth               were removed No date: FHx: diabetes mellitus No date: GERD (gastroesophageal reflux disease) No date: Headache 08/28/2017: History of miscarriage, currently pregnant, first  trimester 08/15/2021: Patient desires vaginal birth after cesarean section  (VBAC) No date: PONV (postoperative nausea and vomiting) 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 01/2023: Symptomatic cholelithiasis 01/15/2022: Term pregnancy, repeat 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  Past Surgical History: 03/25/2018: CESAREAN SECTION; N/A     Comment:  Procedure: PRIMARY CESAREAN SECTION;  Surgeon: Linzie Collin, MD;  Location: [facility] ORS;  Service:               Obstetrics;  Laterality: N/A;  Female born @ 1020   01/15/2022: CESAREAN SECTION; N/A     Comment:  Procedure: CESAREAN SECTION;  Surgeon: Linzie Collin, MD;  Location: [facility] ORS;   Service: Obstetrics;                Laterality: N/A; 01/07/2017: DILATION AND EVACUATION; N/A     Comment:  Procedure: DILATATION AND EVACUATION;  Surgeon: Linzie Collin, MD;  Location: [facility] ORS;  Service:               Gynecology;  Laterality: N/A; 2012: WISDOM TOOTH EXTRACTION  BMI    Body Mass Index: 25.69 kg/m      Reproductive/Obstetrics negative OB ROS                             Anesthesia Physical Anesthesia Plan  ASA: 3  Anesthesia Plan: General ETT   Post-op Pain Management:    Induction: Intravenous  PONV Risk Score and Plan: Ondansetron, Dexamethasone, Midazolam and Treatment may vary due to age or medical condition  Airway Management Planned: Oral ETT  Additional Equipment:   Intra-op Plan:   Post-operative Plan: Extubation in OR  Informed Consent: I have reviewed the patients History and Physical, chart, labs and discussed the procedure including the risks, benefits and alternatives for the proposed anesthesia with the patient or authorized representative who has indicated his/her understanding and acceptance.  Dental Advisory Given  Plan Discussed with: Anesthesiologist, CRNA and Surgeon  Anesthesia Plan Comments: (Patient consented for risks of anesthesia including but not limited to:  - adverse reactions to medications - damage to eyes, teeth, lips or other oral mucosa - nerve damage due to positioning  - sore throat or hoarseness - Damage to heart, brain, nerves, lungs, other parts of body or loss of life  Patient voiced understanding.)       Anesthesia Quick Evaluation

## 2023-02-21 NOTE — Anesthesia Postprocedure Evaluation (Signed)
Anesthesia Post Note  Patient: SHAQUETTA KEAST  Procedure(s) Performed: XI ROBOTIC ASSISTED LAPAROSCOPIC CHOLECYSTECTOMY (Abdomen) INDOCYANINE GREEN FLUORESCENCE IMAGING (ICG)  Patient location during evaluation: PACU Anesthesia Type: General Level of consciousness: awake and alert Pain management: pain level controlled Vital Signs Assessment: post-procedure vital signs reviewed and stable Respiratory status: spontaneous breathing, nonlabored ventilation, respiratory function stable and patient connected to nasal cannula oxygen Cardiovascular status: blood pressure returned to baseline and stable Postop Assessment: no apparent nausea or vomiting Anesthetic complications: no   No notable events documented.   Last Vitals:  Vitals:   02/21/23 1300 02/21/23 1313  BP: (!) 111/92 (!) 139/97  Pulse:  90  Resp: 16 17  Temp:  37.1 C  SpO2: 96% 95%    Last Pain:  Vitals:   02/21/23 1245  PainSc: 5                  Cleda Mccreedy 

## 2023-02-21 NOTE — Interval H&P Note (Signed)
History and Physical Interval Note:  02/21/2023 8:19 AM  Holly Meyer  has presented today for surgery, with the diagnosis of symptomatic cholelithiasis.  The various methods of treatment have been discussed with the patient and family. After consideration of risks, benefits and other options for treatment, the patient has consented to  Procedure(s): XI ROBOTIC ASSISTED LAPAROSCOPIC CHOLECYSTECTOMY (N/A) INDOCYANINE GREEN FLUORESCENCE IMAGING (ICG) (N/A) as a surgical intervention.  The patient's history has been reviewed, patient examined, no change in status, stable for surgery.  I have reviewed the patient's chart and labs.  Questions were answered to the patient's satisfaction.      

## 2023-02-21 NOTE — Discharge Instructions (Addendum)
AMBULATORY SURGERY  DISCHARGE INSTRUCTIONS   The drugs that you were given will stay in your system until tomorrow so for the next 24 hours you should not:  Drive an automobile Make any legal decisions Drink any alcoholic beverage   You may resume regular meals tomorrow.  Today it is better to start with liquids and gradually work up to solid foods.  You may eat anything you prefer, but it is better to start with liquids, then soup and crackers, and gradually work up to solid foods.   Please notify your doctor immediately if you have any unusual bleeding, trouble breathing, redness and pain at the surgery site, drainage, fever, or pain not relieved by medication.    Additional Instructions:   Please contact your physician with any problems or Same Day Surgery at [phone], Monday through Friday 6 am to 4 pm, or [place] [facility] Main number at [phone].   Information for Discharge Teaching: EXPAREL (bupivacaine liposome injectable suspension)   Your surgeon or anesthesiologist gave you EXPAREL(bupivacaine) to help control your pain after surgery.  EXPAREL is a local anesthetic that provides pain relief by numbing the tissue around the surgical site. EXPAREL is designed to release pain medication over time and can control pain for up to 72 hours. Depending on how you respond to EXPAREL, you may require less pain medication during your recovery.  Possible side effects: Temporary loss of sensation or ability to move in the area where bupivacaine was injected. Nausea, vomiting, constipation Rarely, numbness and tingling in your mouth or lips, lightheadedness, or anxiety may occur. Call your doctor right away if you think you may be experiencing any of these sensations, or if you have other questions regarding possible side effects.  Follow all other discharge instructions given to you by your surgeon or nurse. Eat a healthy diet and drink plenty of water or other  fluids.  If you return to the hospital for any reason within 96 hours following the administration of EXPAREL, it is important for health care providers to know that you have received this anesthetic. A teal colored band has been placed on your arm with the date, time and amount of EXPAREL you have received in order to alert and inform your health care providers. Please leave this armband in place for the full 96 hours following administration, and then you may remove the band.  

## 2023-02-22 LAB — SURGICAL PATHOLOGY

## 2023-03-07 ENCOUNTER — Ambulatory Visit (INDEPENDENT_AMBULATORY_CARE_PROVIDER_SITE_OTHER): Payer: Medicaid Other | Admitting: Physician Assistant

## 2023-03-07 ENCOUNTER — Encounter: Payer: Self-pay | Admitting: Physician Assistant

## 2023-03-07 VITALS — BP 124/82 | HR 80 | Temp 97.6°F | Ht 67.0 in | Wt 168.0 lb

## 2023-03-07 DIAGNOSIS — K8020 Calculus of gallbladder without cholecystitis without obstruction: Secondary | ICD-10-CM

## 2023-03-07 DIAGNOSIS — Z09 Encounter for follow-up examination after completed treatment for conditions other than malignant neoplasm: Secondary | ICD-10-CM

## 2023-03-07 NOTE — Progress Notes (Signed)
[facility] POST-OP OFFICE VISIT  03/07/2023  HPI: Holly Meyer is a 27 y.o. female 14 days s/p robotic assisted laparoscopic cholecystectomy for symptomatic cholelithiasis with Dr Aleen Campi  She is doing well Took about 7 days for pain to improve; now pain free No fever, chills, nausea, emesis Did have diarrhea for the first few days; now resolved No issues with PO intake Incisions are healing well No other complaints   Vital signs: BP 124/82   Pulse 80   Temp 97.6 F (36.4 C) (Oral)   Ht 5\' 7"  (1.702 m)   Wt 168 lb (76.2 kg)   LMP 02/12/2023 (Exact Date)   SpO2 99%   BMI 26.31 kg/m    Physical Exam: Constitutional: Well appearing female, NAD Abdomen: Soft, non-tender, non-distended, no rebound/guarding Skin: Laparoscopic incisions are healing well, no erythema or drainage   Assessment/Plan: This is a 27 y.o. female 14 days s/p robotic assisted laparoscopic cholecystectomy for symptomatic cholelithiasis with Dr Aleen Campi   - Pain control prn  - Reviewed wound care recommendation  - Reviewed lifting restrictions; 4 weeks total  - Reviewed surgical pathology; CCC  - She can follow up on as needed basis; She understands to call with questions/concerns  -- Lynden Oxford, PA-C [facility] 03/07/2023, 1:56 PM M-F: 7am - 4pm

## 2023-03-07 NOTE — Patient Instructions (Signed)

## 2023-03-13 ENCOUNTER — Other Ambulatory Visit: Payer: Self-pay | Admitting: Obstetrics and Gynecology

## 2023-04-28 ENCOUNTER — Other Ambulatory Visit: Payer: Self-pay | Admitting: Family Medicine

## 2023-05-02 ENCOUNTER — Ambulatory Visit (INDEPENDENT_AMBULATORY_CARE_PROVIDER_SITE_OTHER): Payer: Medicaid Other | Admitting: Obstetrics and Gynecology

## 2023-05-02 ENCOUNTER — Encounter: Payer: Self-pay | Admitting: Obstetrics and Gynecology

## 2023-05-02 ENCOUNTER — Other Ambulatory Visit (HOSPITAL_COMMUNITY)
Admission: RE | Admit: 2023-05-02 | Discharge: 2023-05-02 | Disposition: A | Discharge status: Home / Self Care | Payer: Medicaid Other | Source: Ambulatory Visit | Attending: Obstetrics and Gynecology | Admitting: Obstetrics and Gynecology

## 2023-05-02 VITALS — BP 100/70 | Ht 67.0 in | Wt 169.0 lb

## 2023-05-02 DIAGNOSIS — R6882 Decreased libido: Secondary | ICD-10-CM

## 2023-05-02 DIAGNOSIS — Z124 Encounter for screening for malignant neoplasm of cervix: Secondary | ICD-10-CM

## 2023-05-02 DIAGNOSIS — Z01419 Encounter for gynecological examination (general) (routine) without abnormal findings: Secondary | ICD-10-CM

## 2023-05-02 NOTE — Progress Notes (Signed)
PCP:  Doreene Nest, NP   Chief Complaint  Patient presents with   Gynecologic Exam    Discuss BC due to decrease libido     HPI:      [name] is a 27 y.o. [id number] whose LMP was Patient's last menstrual period was 04/01/2023 (approximate)., presents today for her annual examination.  Her menses are regular every 28-30 days, lasting 5 days off BC, mod flow.  Dysmenorrhea mild, no BTB. Stopped OCPs about a month ago and libido has improved. Unsure what type of BC she wants to do.   Sex activity: single partner, contraception - withdrawal. No pain/bleeding.  Last Pap: 04/05/20 Results were: no abnormalities ; no hx of abn paps  There is no FH of breast cancer. There is no FH of ovarian cancer. The patient does do self-breast exams. Had RT axillary pain with neg u/s 3/24; sx have resolved  Tobacco use: The patient denies current or previous tobacco use. Alcohol use: social drinker No drug use.  Exercise: moderately active  She does get adequate calcium but not Vitamin D in her diet.  Patient Active Problem List   Diagnosis Date Noted   Sore throat 01/30/2023   Axillary pain, right 12/18/2022   Epigastric abdominal pain 12/18/2022   Anxiety and depression 12/18/2022   Preventative health care 02/01/2017   Allergic rhinitis 02/28/2016   Headache, migraine 08/02/2015   Back pain 04/28/2015   Venous malformation 02/28/2012    Past Surgical History:  Procedure Laterality Date   CESAREAN SECTION N/A 03/25/2018   Procedure: PRIMARY CESAREAN SECTION;  Surgeon: Linzie Collin, MD;  Location: [facility];  Service: Obstetrics;  Laterality: N/A;  Female born @ [birth year]     CESAREAN SECTION N/A 01/15/2022   Procedure: CESAREAN SECTION;  Surgeon: Linzie Collin, MD;  Location: [facility];  Service: Obstetrics;  Laterality: N/A;   CHOLECYSTECTOMY     DILATION AND EVACUATION N/A 01/07/2017   Procedure: DILATATION AND EVACUATION;  Surgeon: Linzie Collin, MD;   Location: [facility];  Service: Gynecology;  Laterality: N/A;   WISDOM TOOTH EXTRACTION  10/15/2010    Family History  Problem Relation Age of Onset   Migraines Mother    Diabetes Mother    Hypertension Father    Diabetes Father    Alzheimer's disease Maternal Grandmother    Diabetes Paternal Grandmother        family history   Heart failure Paternal Grandfather    Diabetes Paternal Grandfather     Social History   Socioeconomic History   Marital status: Married    Spouse name: Thurston Pounds   Number of children: 2   Years of education: Automotive engineer   Highest education level: Associate degree: academic program  Occupational History   Not on file  Tobacco Use   Smoking status: Former    Current packs/day: 0.25    Types: Cigarettes    Passive exposure: Past   Smokeless tobacco: Never   Tobacco comments:    recently quit  Vaping Use   Vaping status: Never Used  Substance and Sexual Activity   Alcohol use: Yes    Comment: socially   Drug use: No   Sexual activity: Yes    Partners: Male    Birth control/protection: None  Other Topics Concern   Not on file  Social History Narrative   Married   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 She is currently in school for business.  Works at [employer].    Enjoys swimming, watching movies.   Social Determinants of Health   Financial Resource Strain: Not on file  Food Insecurity: Low Risk  (02/14/2023)   [facility]   Food vital sign    Within the past 12 months, you worried that your food would run out before you got money to buy more: Never true    Within the past 12 months, the food you bought just didn't last and you didn't have money to get more. : Never true  Transportation Needs: No Transportation Needs (02/14/2023)   [facility]   Transportation    In the past 12 months, has lack of reliable transportation kept you from medical appointments,  meetings, work or from getting things needed for daily living? : No  Physical Activity: Not on file  Stress: Not on file  Social Connections: Not on file  Intimate Partner Violence: Not on file     Current Outpatient Medications:    acetaminophen (TYLENOL) 500 MG tablet, Take 2 tablets (1,000 mg total) by mouth every 6 (six) hours as needed for mild pain., Disp: , Rfl:    ibuprofen (ADVIL) 800 MG tablet, Take 1 tablet (800 mg total) by mouth every 8 (eight) hours as needed for moderate pain., Disp: 60 tablet, Rfl: 1   sertraline (ZOLOFT) 100 MG tablet, TAKE 1 TABLET BY MOUTH EVERY DAY, Disp: 30 tablet, Rfl: 0   desogestrel-ethinyl estradiol (KARIVA) 0.15-0.02/0.01 MG (21/5) tablet, TAKE 1 TABLET BY MOUTH EVERYDAY AT BEDTIME (Patient not taking: Reported on 05/02/2023), Disp: 84 tablet, Rfl: 0     ROS:  Review of Systems  Constitutional:  Negative for fatigue, fever and unexpected weight change.  Respiratory:  Negative for cough, shortness of breath and wheezing.   Cardiovascular:  Negative for chest pain, palpitations and leg swelling.  Gastrointestinal:  Negative for blood in stool, constipation, diarrhea, nausea and vomiting.  Endocrine: Negative for cold intolerance, heat intolerance and polyuria.  Genitourinary:  Negative for dyspareunia, dysuria, flank pain, frequency, genital sores, hematuria, menstrual problem, pelvic pain, urgency, vaginal bleeding, vaginal discharge and vaginal pain.  Musculoskeletal:  Negative for back pain, joint swelling and myalgias.  Skin:  Negative for rash.  Neurological:  Negative for dizziness, syncope, light-headedness, numbness and headaches.  Hematological:  Negative for adenopathy.  Psychiatric/Behavioral:  Negative for agitation, confusion, sleep disturbance and suicidal ideas. The patient is not nervous/anxious.    BREAST: No symptoms   Objective: BP 100/70   Ht 5\' 7"  (1.702 m)   Wt 169 lb (76.7 kg)   LMP 04/01/2023 (Approximate)    Breastfeeding No   BMI 26.47 kg/m    Physical Exam Constitutional:      Appearance: She is well-developed.  Genitourinary:     Vulva normal.     Right Labia: No rash, tenderness or lesions.    Left Labia: No tenderness, lesions or rash.    No vaginal discharge, erythema or tenderness.      Right Adnexa: not tender and no mass present.    Left Adnexa: not tender and no mass present.    No cervical friability or polyp.     Uterus is not enlarged or tender.  Breasts:    Right: No mass, nipple discharge, skin change or tenderness.     Left: No mass, nipple discharge, skin change or tenderness.  Neck:     Thyroid: No thyromegaly.  Cardiovascular:  Rate and Rhythm: Normal rate and regular rhythm.     Heart sounds: Normal heart sounds. No murmur heard. Pulmonary:     Effort: Pulmonary effort is normal.     Breath sounds: Normal breath sounds.  Abdominal:     Palpations: Abdomen is soft.     Tenderness: There is no abdominal tenderness. There is no guarding or rebound.  Musculoskeletal:        General: Normal range of motion.     Cervical back: Normal range of motion.  Lymphadenopathy:     Cervical: No cervical adenopathy.  Neurological:     General: No focal deficit present.     Mental Status: She is alert and oriented to person, place, and time.     Cranial Nerves: No cranial nerve deficit.  Skin:    General: Skin is warm and dry.  Psychiatric:        Mood and Affect: Mood normal.        Behavior: Behavior normal.        Thought Content: Thought content normal.        Judgment: Judgment normal.  Vitals reviewed.     Assessment/Plan: Encounter for annual routine gynecological examination  Cervical cancer screening - Plan: Cytology - PAP  Decreased libido--improved off estrogen OCPs. Discussed prog options vs paragard IUD. Pt to consider and f/u with decision.           GYN counsel adequate intake of calcium and vitamin D, diet and exercise     F/U  Return  in about 1 year (around 05/01/2024).   B. , PA-C 05/02/2023 1:53 PM

## 2023-05-02 NOTE — Patient Instructions (Signed)
I value your feedback and you entrusting us with your care. If you get a [facility] patient survey, I would appreciate you taking the time to let us know about your experience today. Thank you! ? ? ?

## 2023-05-08 ENCOUNTER — Encounter: Payer: Self-pay | Admitting: Obstetrics and Gynecology

## 2023-05-08 LAB — CYTOLOGY - PAP
Diagnosis: NEGATIVE
Diagnosis: REACTIVE

## 2023-05-09 ENCOUNTER — Other Ambulatory Visit: Payer: Self-pay | Admitting: Obstetrics and Gynecology

## 2023-05-09 MED ORDER — NORETHINDRONE 0.35 MG PO TABS: 1.0000 | ORAL_TABLET | Freq: Every day | ORAL | 3 refills | Status: DC

## 2023-05-13 ENCOUNTER — Encounter (HOSPITAL_BASED_OUTPATIENT_CLINIC_OR_DEPARTMENT_OTHER): Payer: Self-pay | Admitting: Otolaryngology

## 2023-05-13 ENCOUNTER — Other Ambulatory Visit: Payer: Self-pay

## 2023-05-14 NOTE — H&P (Signed)
HPI:   Holly Meyer is a 27 y.o. female who presents as a new Patient.   Referring Provider: No ref. provider found  Chief complaint: Recurrent strep.  HPI: History of recurrent strep throat since childhood. Even the past few years she is getting it 3-4 times each year. It seems to be getting worse each time. She does get checked and has positive strep test for culture each time. She has 2 young children but the older one who is 4 has only had strep once. 1 time she know she got it from somebody in her office. She does snore some. Otherwise in good health. She is getting ready to have gallbladder surgery next week.  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     Current Outpatient Medications:  acetaminophen (TYLENOL) 500 mg tablet, Take 1,000 mg by mouth every 6 (six) hours., Disp: , Rfl:  Kariva, 28, 0.15-0.02 mgx21 /0.01 mg x 5 tab tablet, Take 1 tablet by mouth nightly., Disp: , Rfl:  ondansetron (ZOFRAN-ODT) 4 mg disintegrating tablet, Dissolve 4 mg on tongue every 8 (eight) hours as needed., Disp: , Rfl:  sertraline (ZOLOFT) 100 mg tablet, Take 1 tablet by mouth daily., Disp: , Rfl:   A complete ROS was performed with pertinent positives/negatives noted in the HPI. The remainder of the ROS are negative.   Physical Exam:   Pulse 97  Temp 97.3 F (36.3 C) (Temporal)  Resp 18  Ht 1.702 m (5\' 7" )   Wt 76.8 kg (169 lb 6.4 oz)  SpO2 98%  BMI 26.53 kg/m   General: Healthy and alert, in no distress, breathing easily. Normal affect. In a pleasant mood. Head: Normocephalic, atraumatic. No masses, or scars. Eyes: Pupils are equal, and reactive to light. Vision is grossly intact. No spontaneous or gaze nystagmus. Ears: Ear canals are clear on the right with impacted cerumen on the left that was cleaned out. Tympanic membranes are intact, with normal landmarks and the middle ears are clear and healthy. There is erosion of the incus long process visualized but no active disease. Hearing: Grossly normal. Nose: Nasal cavities are clear with healthy mucosa, no polyps or exudate. Airways are patent. Face: No masses or scars, facial nerve function is symmetric. Oral Cavity: No mucosal abnormalities are noted. Tongue with normal mobility. Dentition appears healthy. Oropharynx: Tonsils are symmetric. There are no mucosal masses identified. Tongue base appears normal and healthy. Larynx/Hypopharynx: deferred Chest: Deferred Neck: No palpable masses, no cervical adenopathy, no thyroid nodules or enlargement. Neuro: Cranial nerves II-XII with normal function. Balance: Normal gate. Other findings: none.  Independent Review of Additional Tests or Records:  none  Procedures:  none  Impression & Plans:  1. Left cerumen impaction cleared out. Avoid using Q-tips.  2. Recurring strep. Consider tonsillectomy.Malvina meets the indications for tonsillectomy. Risks and benefits were discussed in detail. All questions were answered. A handout was provided with additional details.

## 2023-05-20 ENCOUNTER — Ambulatory Visit (HOSPITAL_BASED_OUTPATIENT_CLINIC_OR_DEPARTMENT_OTHER): Payer: Medicaid Other | Admitting: Certified Registered Nurse Anesthetist

## 2023-05-20 ENCOUNTER — Encounter (HOSPITAL_BASED_OUTPATIENT_CLINIC_OR_DEPARTMENT_OTHER)
Admission: RE | Disposition: A | Discharge status: Home / Self Care | Payer: Self-pay | Source: Home / Self Care | Attending: Otolaryngology

## 2023-05-20 ENCOUNTER — Encounter (HOSPITAL_BASED_OUTPATIENT_CLINIC_OR_DEPARTMENT_OTHER): Payer: Self-pay | Admitting: Otolaryngology

## 2023-05-20 ENCOUNTER — Other Ambulatory Visit: Payer: Self-pay

## 2023-05-20 ENCOUNTER — Ambulatory Visit (HOSPITAL_BASED_OUTPATIENT_CLINIC_OR_DEPARTMENT_OTHER)
Admission: RE | Admit: 2023-05-20 | Discharge: 2023-05-20 | Disposition: A | Discharge status: Home / Self Care | Payer: Medicaid Other | Attending: Otolaryngology | Admitting: Otolaryngology

## 2023-05-20 DIAGNOSIS — J020 Streptococcal pharyngitis: Secondary | ICD-10-CM | POA: Insufficient documentation

## 2023-05-20 DIAGNOSIS — Z87891 Personal history of nicotine dependence: Secondary | ICD-10-CM | POA: Diagnosis not present

## 2023-05-20 DIAGNOSIS — Z01818 Encounter for other preprocedural examination: Secondary | ICD-10-CM

## 2023-05-20 HISTORY — PX: TONSILLECTOMY: SHX5217

## 2023-05-20 LAB — POCT PREGNANCY, URINE: Preg Test, Ur: NEGATIVE

## 2023-05-20 SURGERY — TONSILLECTOMY
Anesthesia: General | Site: Mouth | Laterality: Bilateral

## 2023-05-20 MED ORDER — DEXAMETHASONE SODIUM PHOSPHATE 10 MG/ML IJ SOLN
INTRAMUSCULAR | Status: DC | PRN
  Administered 2023-05-20: 10 mg via INTRAVENOUS

## 2023-05-20 MED ORDER — DEXMEDETOMIDINE HCL IN NACL 80 MCG/20ML IV SOLN
INTRAVENOUS | Status: DC | PRN
  Administered 2023-05-20 (×3): 4 ug via INTRAVENOUS

## 2023-05-20 MED ORDER — MIDAZOLAM HCL 5 MG/5ML IJ SOLN
INTRAMUSCULAR | Status: DC | PRN
  Administered 2023-05-20: 2 mg via INTRAVENOUS

## 2023-05-20 MED ORDER — HYDROMORPHONE HCL 1 MG/ML IJ SOLN
INTRAMUSCULAR | Status: AC
  Filled 2023-05-20: qty 0.5

## 2023-05-20 MED ORDER — HYDROCODONE-ACETAMINOPHEN 7.5-325 MG/15ML PO SOLN: 15.0000 mL | Freq: Four times a day (QID) | ORAL | 0 refills | Status: DC | PRN

## 2023-05-20 MED ORDER — PROPOFOL 10 MG/ML IV BOLUS
INTRAVENOUS | Status: DC | PRN
Start: 2023-05-20 — End: 2023-05-20
  Administered 2023-05-20: 150 mg via INTRAVENOUS

## 2023-05-20 MED ORDER — LACTATED RINGERS IV SOLN: INTRAVENOUS | Status: DC

## 2023-05-20 MED ORDER — ONDANSETRON 4 MG PO TBDP: 4.0000 mg | ORAL_TABLET | Freq: Three times a day (TID) | ORAL | 0 refills | Status: DC | PRN

## 2023-05-20 MED ORDER — ACETAMINOPHEN 500 MG PO TABS: 1000.0000 mg | ORAL_TABLET | Freq: Once | ORAL | Status: DC

## 2023-05-20 MED ORDER — OXYCODONE HCL 5 MG PO TABS
ORAL_TABLET | ORAL | Status: AC
  Filled 2023-05-20: qty 1

## 2023-05-20 MED ORDER — ONDANSETRON HCL 4 MG/2ML IJ SOLN: 4.0000 mg | Freq: Once | INTRAMUSCULAR | Status: DC | PRN

## 2023-05-20 MED ORDER — OXYCODONE HCL 5 MG/5ML PO SOLN: 5.0000 mg | Freq: Once | ORAL | Status: AC | PRN

## 2023-05-20 MED ORDER — HYDROMORPHONE HCL 1 MG/ML IJ SOLN
0.2500 mg | INTRAMUSCULAR | Status: DC | PRN
  Administered 2023-05-20 (×2): 0.25 mg via INTRAVENOUS

## 2023-05-20 MED ORDER — MIDAZOLAM HCL 2 MG/2ML IJ SOLN
INTRAMUSCULAR | Status: AC
  Filled 2023-05-20: qty 2

## 2023-05-20 MED ORDER — FENTANYL CITRATE (PF) 100 MCG/2ML IJ SOLN
INTRAMUSCULAR | Status: DC | PRN
  Administered 2023-05-20 (×4): 50 ug via INTRAVENOUS

## 2023-05-20 MED ORDER — FENTANYL CITRATE (PF) 100 MCG/2ML IJ SOLN
INTRAMUSCULAR | Status: AC
  Filled 2023-05-20: qty 2

## 2023-05-20 MED ORDER — OXYCODONE HCL 5 MG PO TABS
5.0000 mg | ORAL_TABLET | Freq: Once | ORAL | Status: AC | PRN
  Administered 2023-05-20: 5 mg via ORAL

## 2023-05-20 MED ORDER — ONDANSETRON HCL 4 MG/2ML IJ SOLN
INTRAMUSCULAR | Status: DC | PRN
Start: 2023-05-20 — End: 2023-05-20
  Administered 2023-05-20: 4 mg via INTRAVENOUS

## 2023-05-20 MED ORDER — 0.9 % SODIUM CHLORIDE (POUR BTL) OPTIME
TOPICAL | Status: DC | PRN
  Administered 2023-05-20: 1000 mL

## 2023-05-20 MED ORDER — PROPOFOL 10 MG/ML IV BOLUS
INTRAVENOUS | Status: AC
  Filled 2023-05-20: qty 20

## 2023-05-20 MED ORDER — LIDOCAINE 2% (20 MG/ML) 5 ML SYRINGE
INTRAMUSCULAR | Status: DC | PRN
  Administered 2023-05-20: 100 mg via INTRAVENOUS

## 2023-05-20 SURGICAL SUPPLY — 27 items
CANISTER SUCT 1200ML W/VALVE (MISCELLANEOUS) ×1 IMPLANT
CATH ROBINSON RED A/P 12FR (CATHETERS) ×1 IMPLANT
CLEANER CAUTERY TIP 5X5 PAD (MISCELLANEOUS) ×1 IMPLANT
COAGULATOR SUCT SWTCH 10FR 6 (ELECTROSURGICAL) ×1 IMPLANT
COVER BACK TABLE 60X90IN (DRAPES) ×1 IMPLANT
COVER MAYO STAND STRL (DRAPES) ×1 IMPLANT
DEFOGGER MIRROR 1QT (MISCELLANEOUS) IMPLANT
ELECT COATED BLADE 2.86 ST (ELECTRODE) ×1 IMPLANT
ELECT REM PT RETURN 9FT ADLT (ELECTROSURGICAL) ×1
ELECT REM PT RETURN 9FT PED (ELECTROSURGICAL)
ELECTRODE REM PT RETRN 9FT PED (ELECTROSURGICAL) IMPLANT
ELECTRODE REM PT RTRN 9FT ADLT (ELECTROSURGICAL) IMPLANT
GAUZE SPONGE 4X4 12PLY STRL LF (GAUZE/BANDAGES/DRESSINGS) ×1 IMPLANT
GLOVE ECLIPSE 7.5 STRL STRAW (GLOVE) ×1 IMPLANT
GOWN STRL REUS W/ TWL LRG LVL3 (GOWN DISPOSABLE) ×2 IMPLANT
GOWN STRL REUS W/TWL LRG LVL3 (GOWN DISPOSABLE) ×2
MARKER SKIN DUAL TIP RULER LAB (MISCELLANEOUS) IMPLANT
NS IRRIG 1000ML POUR BTL (IV SOLUTION) ×1 IMPLANT
PENCIL FOOT CONTROL (ELECTRODE) ×1 IMPLANT
SHEET MEDIUM DRAPE 40X70 STRL (DRAPES) ×1 IMPLANT
SPONGE TONSIL 1 RF SGL (DISPOSABLE) IMPLANT
SPONGE TONSIL 1.25 RF SGL STRG (GAUZE/BANDAGES/DRESSINGS) IMPLANT
SYR BULB EAR ULCER 3OZ GRN STR (SYRINGE) ×1 IMPLANT
TOWEL GREEN STERILE FF (TOWEL DISPOSABLE) ×1 IMPLANT
TUBE CONNECTING 20X1/4 (TUBING) ×1 IMPLANT
TUBE SALEM SUMP 12FR 48 (TUBING) IMPLANT
TUBE SALEM SUMP 16F (TUBING) IMPLANT

## 2023-05-20 NOTE — Transfer of Care (Signed)
Immediate Anesthesia Transfer of Care Note  Patient: Holly Meyer  Procedure(s) Performed: TONSILLECTOMY (Bilateral: Mouth)  Patient Location: PACU  Anesthesia Type:General  Level of Consciousness: drowsy and patient cooperative  Airway & Oxygen Therapy: Patient Spontanous Breathing and Patient connected to face mask oxygen  Post-op Assessment: Report given to RN and Post -op Vital signs reviewed and stable  Post vital signs: Reviewed and stable  Last Vitals:  Vitals Value Taken Time  BP 148/91 05/20/23 1048  Temp    Pulse 69 05/20/23 1052  Resp 12 05/20/23 1052  SpO2 97 % 05/20/23 1052  Vitals shown include unfiled device data.  Last Pain:  Vitals:   05/20/23 0922  TempSrc: Temporal  PainSc: 0-No pain      Patients Stated Pain Goal: 3 (05/20/23 7425)  Complications: No notable events documented.

## 2023-05-20 NOTE — Anesthesia Preprocedure Evaluation (Signed)
Anesthesia Evaluation  Patient identified by MRN, date of birth, ID band Patient awake    Reviewed: Allergy & Precautions, H&P , NPO status , Patient's Chart, lab work & pertinent test results  History of Anesthesia Complications (+) PONV and history of anesthetic complications  Airway Mallampati: II  TM Distance: >3 FB Neck ROM: Full    Dental no notable dental hx.    Pulmonary neg pulmonary ROS, former smoker   Pulmonary exam normal breath sounds clear to auscultation       Cardiovascular negative cardio ROS Normal cardiovascular exam Rhythm:Regular Rate:Normal     Neuro/Psych negative neurological ROS  negative psych ROS   GI/Hepatic Neg liver ROS,GERD  ,,  Endo/Other  negative endocrine ROS    Renal/GU negative Renal ROS  negative genitourinary   Musculoskeletal negative musculoskeletal ROS (+)    Abdominal   Peds negative pediatric ROS (+)  Hematology negative hematology ROS (+)   Anesthesia Other Findings   Reproductive/Obstetrics negative OB ROS                             Anesthesia Physical Anesthesia Plan  ASA: 2  Anesthesia Plan: General   Post-op Pain Management: Tylenol PO (pre-op)*   Induction: Intravenous  PONV Risk Score and Plan: 4 or greater and Ondansetron, Dexamethasone, Droperidol, Midazolam and Treatment may vary due to age or medical condition  Airway Management Planned: Oral ETT  Additional Equipment:   Intra-op Plan:   Post-operative Plan: Extubation in OR  Informed Consent: I have reviewed the patients History and Physical, chart, labs and discussed the procedure including the risks, benefits and alternatives for the proposed anesthesia with the patient or authorized representative who has indicated his/her understanding and acceptance.     Dental advisory given  Plan Discussed with: CRNA and Surgeon  Anesthesia Plan Comments:         Anesthesia Quick Evaluation

## 2023-05-20 NOTE — Interval H&P Note (Signed)
History and Physical Interval Note:  05/20/2023 9:21 AM  Holly Meyer  has presented today for surgery, with the diagnosis of Recurrent streptococcal pharyngitis.  The various methods of treatment have been discussed with the patient and family. After consideration of risks, benefits and other options for treatment, the patient has consented to  Procedure(s): TONSILLECTOMY (Bilateral) as a surgical intervention.  The patient's history has been reviewed, patient examined, no change in status, stable for surgery.  I have reviewed the patient's chart and labs.  Questions were answered to the patient's satisfaction.     Serena Colonel

## 2023-05-20 NOTE — Op Note (Signed)
05/20/2023  10:36 AM  PATIENT:  Holly Meyer  27 y.o. female  PRE-OPERATIVE DIAGNOSIS:  Recurrent streptococcal pharyngitis  POST-OPERATIVE DIAGNOSIS:  Recurrent streptococcal pharyngitis  PROCEDURE:  Procedure(s): TONSILLECTOMY  SURGEON:  Surgeon(s): Serena Colonel, MD  ANESTHESIA:   General  COUNTS: Correct   DICTATION: The patient was taken to the operating room and placed on the operating table in the supine position. Following induction of general endotracheal anesthesia, the table was turned and the patient was draped in a standard fashion. A Crowe-Davis mouthgag was inserted into the oral cavity and used to retract the tongue and mandible, then attached to the Mayo stand.  The tonsillectomy was then performed using electrocautery dissection, carefully dissecting the avascular plane between the capsule and constrictor muscles. Cautery was used for completion of hemostasis. The tonsils were moderately sized and cryptic and scarred , and were discarded.  The pharynx was irrigated with saline and suctioned. An oral gastric tube was used to aspirate the contents of the stomach. The patient was then awakened from anesthesia and transferred to PACU in stable condition.   PATIENT DISPOSITION:  To PACU, stable

## 2023-05-20 NOTE — Discharge Instructions (Signed)

## 2023-05-20 NOTE — Anesthesia Procedure Notes (Signed)
Procedure Name: Intubation Date/Time: 05/20/2023 10:08 AM  Performed by: Alvera Novel, CRNAPre-anesthesia Checklist: Patient identified, Emergency Drugs available, Suction available and Patient being monitored Patient Re-evaluated:Patient Re-evaluated prior to induction Oxygen Delivery Method: Circle System Utilized Preoxygenation: Pre-oxygenation with 100% oxygen Induction Type: IV induction Ventilation: Mask ventilation without difficulty Laryngoscope Size: Mac and 3 Grade View: Grade I Tube type: Oral Number of attempts: 1 Airway Equipment and Method: Stylet and Oral airway Placement Confirmation: ETT inserted through vocal cords under direct vision, positive ETCO2 and breath sounds checked- equal and bilateral Secured at: 21 cm Tube secured with: Tape Dental Injury: Teeth and Oropharynx as per pre-operative assessment

## 2023-05-20 NOTE — Anesthesia Postprocedure Evaluation (Signed)
Anesthesia Post Note  Patient: Holly Meyer  Procedure(s) Performed: TONSILLECTOMY (Bilateral: Mouth)     Patient location during evaluation: PACU Anesthesia Type: General Level of consciousness: awake and alert Pain management: pain level controlled Vital Signs Assessment: post-procedure vital signs reviewed and stable Respiratory status: spontaneous breathing, nonlabored ventilation, respiratory function stable and patient connected to nasal cannula oxygen Cardiovascular status: blood pressure returned to baseline and stable Postop Assessment: no apparent nausea or vomiting Anesthetic complications: no  No notable events documented.  Last Vitals:  Vitals:   05/20/23 1136 05/20/23 1206  BP: 122/81 (!) 123/96  Pulse: 82 76  Resp: 12 16  Temp: 36.8 C   SpO2: 94% 96%    Last Pain:  Vitals:   05/20/23 1206  TempSrc:   PainSc: 5                  , S

## 2023-05-21 ENCOUNTER — Encounter (HOSPITAL_BASED_OUTPATIENT_CLINIC_OR_DEPARTMENT_OTHER): Payer: Self-pay | Admitting: Otolaryngology

## 2023-07-16 ENCOUNTER — Ambulatory Visit (INDEPENDENT_AMBULATORY_CARE_PROVIDER_SITE_OTHER): Payer: Medicaid Other | Admitting: Primary Care

## 2023-07-16 ENCOUNTER — Encounter: Payer: Self-pay | Admitting: Primary Care

## 2023-07-16 VITALS — BP 124/78 | HR 100 | Temp 97.7°F | Ht 67.0 in | Wt 165.0 lb

## 2023-07-16 DIAGNOSIS — R051 Acute cough: Secondary | ICD-10-CM | POA: Insufficient documentation

## 2023-07-16 LAB — POC COVID19 BINAXNOW: SARS Coronavirus 2 Ag: NEGATIVE

## 2023-07-16 MED ORDER — BENZONATATE 200 MG PO CAPS: 200.0000 mg | ORAL_CAPSULE | Freq: Three times a day (TID) | ORAL | 0 refills | Status: DC | PRN

## 2023-07-16 NOTE — Patient Instructions (Signed)
You may take Benzonatate capsules for cough. Take 1 capsule by mouth three times daily as needed for cough.  You can try a few things over the counter to help with your symptoms including:  Cough: Delsym or Robitussin (get the off brand, works just as well) Chest Congestion: Mucinex (plain) Nasal Congestion/Ear Pressure/Sinus Pressure: Try using Flonase (fluticasone) nasal spray. Instill 1 spray in each nostril twice daily. This can be purchased over the counter. Body aches, fevers, headache: Ibuprofen (not to exceed 2400 mg in 24 hours) or Acetaminophen-Tylenol (not to exceed 3000 mg in 24 hours) Runny Nose/Throat Drainage/Sneezing/Itchy or Watery Eyes: An antihistamine such as Zyrtec, Claritin, Xyzal, Allegra  You should be feeling better by day seven of symptoms, but please do contact me if this is not the case.  It was a pleasure to see you today!

## 2023-07-16 NOTE — Progress Notes (Signed)
Subjective:    Patient ID: NIXIE JOKINEN, female    DOB: [birth year], 27 y.o.   MRN: [id number]  HPI  LAKENA YIU is a very pleasant 27 y.o. female with a history of migraines, venous malformation, anxiety/depression, recurrent strep pharyngitis who presents today to discuss cough.  Symptom onset yesterday with scratchy throat and head pressure. She then developed body aches, nasal congestion, rhinorrhea, cough, fatigue, era pain.   She did not take a home Covid-19 test. She denies fevers, sick contacts. She's taken TheraFlu without improvement which historically works.      Review of Systems  Constitutional:  Positive for fatigue. Negative for chills and fever.  HENT:  Positive for congestion and ear pain. Negative for sinus pressure.   Respiratory:  Positive for cough.   Neurological:  Positive for headaches.         Past Medical History:  Diagnosis Date   Anemia    Complication of anesthesia    complaints of nausea and vomitting when wisdom teeth were removed   FHx: diabetes mellitus    GERD (gastroesophageal reflux disease)    Headache    History of miscarriage, currently pregnant, first trimester 08/28/2017   Patient desires vaginal birth after cesarean section (VBAC) 08/15/2021   PONV (postoperative nausea and vomiting)    Septate uterus    Symptomatic cholelithiasis 01/2023   Term pregnancy, repeat 01/15/2022   Venous malformation    Right paraspinal    Social History   Socioeconomic History   Marital status: Married    Spouse name: Thurston Pounds   Number of children: 2   Years of education: Automotive engineer   Highest education level: Associate degree: academic program  Occupational History   Not on file  Tobacco Use   Smoking status: Former    Current packs/day: 0.25    Types: Cigarettes    Passive exposure: Past   Smokeless tobacco: Never   Tobacco comments:    recently quit  Vaping Use   Vaping status: Never Used  Substance and Sexual Activity   Alcohol  use: Yes    Comment: socially   Drug use: No   Sexual activity: Yes    Partners: Male    Birth control/protection: None  Other Topics Concern   Not on file  Social History Narrative   Married   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 She is currently in school for business.   Works at [employer].    Enjoys swimming, watching movies.   Social Determinants of Health   Financial Resource Strain: Not on file  Food Insecurity: Low Risk  (02/14/2023)   [facility]   Hunger Vital Sign    Worried About Running Out of Food in the Last Year: Never true    Ran Out of Food in the Last Year: Never true  Transportation Needs: No Transportation Needs (02/14/2023)   [facility]   Transportation    In the past 12 months, has lack of reliable transportation kept you from medical appointments, meetings, work or from getting things needed for daily living? : No  Physical Activity: Not on file  Stress: Not on file  Social Connections: Not on file  Intimate Partner Violence: Not on file    Past Surgical History:  Procedure Laterality Date   CESAREAN SECTION N/A 03/25/2018   Procedure: PRIMARY CESAREAN SECTION;  Surgeon: Linzie Collin, MD;  Location: [facility];  Service: Obstetrics;  Laterality: N/A;  Female born @ 52     CESAREAN SECTION N/A 01/15/2022   Procedure: CESAREAN SECTION;  Surgeon: Linzie Collin, MD;  Location: [facility];  Service: Obstetrics;  Laterality: N/A;   CHOLECYSTECTOMY     DILATION AND EVACUATION N/A 01/07/2017   Procedure: DILATATION AND EVACUATION;  Surgeon: Linzie Collin, MD;  Location: [facility];  Service: Gynecology;  Laterality: N/A;   TONSILLECTOMY Bilateral 05/20/2023   Procedure: TONSILLECTOMY;  Surgeon: Serena Colonel, MD;  Location: [facility];  Service: ENT;  Laterality: Bilateral;   WISDOM TOOTH EXTRACTION  10/15/2010    Family History  Problem  Relation Age of Onset   Migraines Mother    Diabetes Mother    Hypertension Father    Diabetes Father    Alzheimer's disease Maternal Grandmother    Diabetes Paternal Grandmother        family history   Heart failure Paternal Grandfather    Diabetes Paternal Grandfather     Allergies  Allergen Reactions   Fexofenadine Shortness Of Breath and Other (See Comments)    Respiratory Distress (ALLERGY/intolerance)   Imitrex [Sumatriptan] Other (See Comments)    Symptoms worsened   Guaifenesin Other (See Comments)    Dizziness (intolerance)   Guaifenesin Er Other (See Comments)    Dizziness   Meloxicam Other (See Comments)    Unknown   Naproxen Other (See Comments)    Loss of appiteite   Tomato Rash    Tongue gets sores     Current Outpatient Medications on File Prior to Visit  Medication Sig Dispense Refill   acetaminophen (TYLENOL) 500 MG tablet Take 2 tablets (1,000 mg total) by mouth every 6 (six) hours as needed for mild pain.     ibuprofen (ADVIL) 800 MG tablet Take 1 tablet (800 mg total) by mouth every 8 (eight) hours as needed for moderate pain. 60 tablet 1   sertraline (ZOLOFT) 100 MG tablet TAKE 1 TABLET BY MOUTH EVERY DAY 30 tablet 0   norethindrone (MICRONOR) 0.35 MG tablet Take 1 tablet (0.35 mg total) by mouth daily. (Patient not taking: Reported on 07/16/2023) 84 tablet 3   ondansetron (ZOFRAN-ODT) 4 MG disintegrating tablet Take 1 tablet (4 mg total) by mouth every 8 (eight) hours as needed for nausea or vomiting. (Patient not taking: Reported on 07/16/2023) 20 tablet 0   No current facility-administered medications on file prior to visit.    BP 124/78   Pulse 100   Temp 97.7 F (36.5 C) (Temporal)   Ht 5\' 7"  (1.702 m)   Wt 165 lb (74.8 kg)   SpO2 100%   BMI 25.84 kg/m  Objective:   Physical Exam Constitutional:      Appearance: She is not ill-appearing.  HENT:     Right Ear: Tympanic membrane and ear canal normal.     Left Ear: Tympanic membrane and ear  canal normal.     Nose: No mucosal edema.     Right Sinus: No maxillary sinus tenderness or frontal sinus tenderness.     Left Sinus: No maxillary sinus tenderness or frontal sinus tenderness.     Mouth/Throat:     Mouth: Mucous membranes are moist.  Eyes:     Conjunctiva/sclera: Conjunctivae normal.  Cardiovascular:     Rate and Rhythm: Normal rate and regular rhythm.  Pulmonary:     Effort: Pulmonary effort is normal.     Breath sounds: Normal breath sounds. No wheezing or rhonchi.  Musculoskeletal:  Cervical back: Neck supple.  Skin:    General: Skin is warm and dry.           Assessment & Plan:  Acute cough Assessment & Plan: Symptoms represent a viral etiology at this point. Exam reassuring. Rapid COVID-19 test negative today.  Discussed over-the-counter options for conservative treatment.  Start benzonatate capsules for cough. Take 1 capsule by mouth three times daily as needed for cough.  Follow-up if no improvement in symptoms after 7 days from symptom onset.  Orders: -     POC COVID-19 BinaxNow -     Benzonatate; Take 1 capsule (200 mg total) by mouth 3 (three) times daily as needed for cough.  Dispense: 15 capsule; Refill: 0        Doreene Nest, NP

## 2023-07-16 NOTE — Assessment & Plan Note (Addendum)
Symptoms represent a viral etiology at this point. Exam reassuring. Rapid COVID-19 test negative today.  Discussed over-the-counter options for conservative treatment.  Start benzonatate capsules for cough. Take 1 capsule by mouth three times daily as needed for cough.  Follow-up if no improvement in symptoms after 7 days from symptom onset.

## 2023-08-19 ENCOUNTER — Other Ambulatory Visit: Payer: Self-pay | Admitting: Family Medicine

## 2023-08-19 DIAGNOSIS — F32A Depression, unspecified: Secondary | ICD-10-CM

## 2023-08-19 MED ORDER — SERTRALINE HCL 100 MG PO TABS
100.0000 mg | ORAL_TABLET | Freq: Every day | ORAL | 0 refills | Status: DC
Start: 2023-08-19 — End: 2023-08-20

## 2023-08-20 ENCOUNTER — Telehealth (INDEPENDENT_AMBULATORY_CARE_PROVIDER_SITE_OTHER): Payer: Medicaid Other | Admitting: Primary Care

## 2023-08-20 DIAGNOSIS — F32A Depression, unspecified: Secondary | ICD-10-CM

## 2023-08-20 DIAGNOSIS — F419 Anxiety disorder, unspecified: Secondary | ICD-10-CM

## 2023-08-20 MED ORDER — ESCITALOPRAM OXALATE 10 MG PO TABS
10.0000 mg | ORAL_TABLET | Freq: Every day | ORAL | 0 refills | Status: DC
Start: 2023-08-20 — End: 2023-10-08

## 2023-08-20 NOTE — Progress Notes (Signed)
Patient ID: Holly Meyer, female    DOB: 1996/05/20, 27 y.o.   MRN: [id number]  Virtual visit completed through [employer], a video enabled telemedicine application. Due to national recommendations of social distancing due to COVID-19, a virtual visit is felt to be most appropriate for this patient at this time. Reviewed limitations, risks, security and privacy concerns of performing a virtual visit and the availability of in person appointments. I also reviewed that there may be a patient responsible charge related to this service. The patient agreed to proceed.   Patient location: home Provider location: [place] [facility], office Persons participating in this virtual visit: patient, provider   If any vitals were documented, they were collected by patient at home unless specified below.    There were no vitals taken for this visit.   CC: Anxiety  Subjective:   HPI: Holly Meyer is a 27 y.o. female with a history of anxiety and depression, migraines presenting on 08/20/2023 for Anxiety  Currently managed on sertraline 100 mg daily for which she has been taking since April or May 2023 for post partum depression and chronic anxiety.   She feels as though the Zoloft helps with depression until about 3 pm each day.  She felt the same when she was managed on the 50 mg dose so her provider increase her to 100 mg.  Around 3 PM each day she will experience symptoms of feeling down, "in a fog".   She does believe that the Zoloft helped some with anxiety but not much.  She struggles with chronic anxiety symptoms include feeling restless/fidgety, difficulty focusing, difficulty completing tasks, feeling anxious, feeling overwhelmed, worrying. She also has to re-arrange her desk at work often, everything has to be in a certain spot. Sometimes she re-arranges objects several times.  She questions that she has OCD and/or ADHD.  She did have trouble in school as a child.   She has never  tried other medications aside from Zoloft.       08/20/2023    2:01 PM 12/18/2022    9:14 AM 11/06/2022    9:34 AM 11/06/2022    9:28 AM  GAD 7 : Generalized Anxiety Score  Nervous, Anxious, on Edge 3 2 1  0  Control/stop worrying 2 3  0  Worry too much - different things 3 3 2  0  Trouble relaxing 3 3 2  0  Restless 3 3 2  0  Easily annoyed or irritable 2 2 1  0  Afraid - awful might happen 3 2 1  0  Total GAD 7 Score 19 18  0  Anxiety Difficulty Somewhat difficult Very difficult Somewhat difficult Not difficult at all         Relevant past medical, surgical, family and social history reviewed and updated as indicated. Interim medical history since our last visit reviewed. Allergies and medications reviewed and updated. Outpatient Medications Prior to Visit  Medication Sig Dispense Refill   acetaminophen (TYLENOL) 500 MG tablet Take 2 tablets (1,000 mg total) by mouth every 6 (six) hours as needed for mild pain.     benzonatate (TESSALON) 200 MG capsule Take 1 capsule (200 mg total) by mouth 3 (three) times daily as needed for cough. 15 capsule 0   ibuprofen (ADVIL) 800 MG tablet Take 1 tablet (800 mg total) by mouth every 8 (eight) hours as needed for moderate pain. 60 tablet 1   norethindrone (MICRONOR) 0.35 MG tablet Take 1 tablet (0.35 mg total) by mouth  daily. (Patient not taking: Reported on 07/16/2023) 84 tablet 3   ondansetron (ZOFRAN-ODT) 4 MG disintegrating tablet Take 1 tablet (4 mg total) by mouth every 8 (eight) hours as needed for nausea or vomiting. (Patient not taking: Reported on 07/16/2023) 20 tablet 0   sertraline (ZOLOFT) 100 MG tablet Take 1 tablet (100 mg total) by mouth daily. for anxiety and depression. 90 tablet 0   No facility-administered medications prior to visit.     Per HPI unless specifically indicated in ROS section below Review of Systems  Respiratory:  Negative for shortness of breath.   Cardiovascular:  Negative for chest pain and palpitations.   Neurological:  Negative for headaches.  Psychiatric/Behavioral:  Positive for decreased concentration. The patient is nervous/anxious.    Objective:  There were no vitals taken for this visit.  Wt Readings from Last 3 Encounters:  07/16/23 165 lb (74.8 kg)  05/20/23 162 lb 7.7 oz (73.7 kg)  05/02/23 169 lb (76.7 kg)       Physical exam: General: Alert and oriented x 3, no distress, does not appear sickly  Pulmonary: Speaks in complete sentences without increased work of breathing, no cough during visit.  Psychiatric: Normal mood, thought content, and behavior.     Results for orders placed or performed in visit on 07/16/23  POC COVID-19  Result Value Ref Range   SARS Coronavirus 2 Ag Negative Negative   Assessment & Plan:   Problem List Items Addressed This Visit       Other   Anxiety and depression - Primary    Deteriorated.  Discussed options for treatment including switching to Lexapro, adding buspirone, increasing dose of Zoloft.  Stop Zoloft 100 mg daily. Start Lexapro 10 mg daily.  Consider need for titration upward to 20 mg.  She will update in a few weeks.      Relevant Medications   escitalopram (LEXAPRO) 10 MG tablet     Meds ordered this encounter  Medications   escitalopram (LEXAPRO) 10 MG tablet    Sig: Take 1 tablet (10 mg total) by mouth daily. for anxiety and depression.    Dispense:  90 tablet    Refill:  0    Order Specific Question:   Supervising Provider    Answer:   BEDSOLE, AMY E [[id number]]   No orders of the defined types were placed in this encounter.   I discussed the assessment and treatment plan with the patient. The patient was provided an opportunity to ask questions and all were answered. The patient agreed with the plan and demonstrated an understanding of the instructions. The patient was advised to call back or seek an in-person evaluation if the symptoms worsen or if the condition fails to improve as anticipated.  Follow up  plan:  Stop taking sertraline (Zoloft) for anxiety/depression.  Start taking escitalopram (Lexapro) 10 mg once daily for anxiety/depression.  Please update me in a few weeks.  It was a pleasure to see you today!   Doreene Nest, NP

## 2023-08-20 NOTE — Patient Instructions (Signed)
Stop taking sertraline (Zoloft) for anxiety/depression.  Start taking escitalopram (Lexapro) 10 mg once daily for anxiety/depression.  Please update me in a few weeks.  It was a pleasure to see you today!

## 2023-08-20 NOTE — Assessment & Plan Note (Signed)
Deteriorated.  Discussed options for treatment including switching to Lexapro, adding buspirone, increasing dose of Zoloft.  Stop Zoloft 100 mg daily. Start Lexapro 10 mg daily.  Consider need for titration upward to 20 mg.  She will update in a few weeks.

## 2023-10-07 ENCOUNTER — Encounter (INDEPENDENT_AMBULATORY_CARE_PROVIDER_SITE_OTHER): Payer: Medicaid Other

## 2023-10-07 DIAGNOSIS — F419 Anxiety disorder, unspecified: Secondary | ICD-10-CM

## 2023-10-07 DIAGNOSIS — F32A Depression, unspecified: Secondary | ICD-10-CM | POA: Diagnosis not present

## 2023-10-08 MED ORDER — ESCITALOPRAM OXALATE 20 MG PO TABS
20.0000 mg | ORAL_TABLET | Freq: Every day | ORAL | 0 refills | Status: DC
Start: 2023-10-08 — End: 2023-10-17

## 2023-10-17 MED ORDER — FLUOXETINE HCL 20 MG PO TABS
20.0000 mg | ORAL_TABLET | Freq: Every day | ORAL | 0 refills | Status: DC
Start: 2023-10-17 — End: 2024-01-13

## 2023-10-17 NOTE — Telephone Encounter (Signed)

## 2024-01-13 ENCOUNTER — Other Ambulatory Visit: Payer: Self-pay | Admitting: Primary Care

## 2024-01-13 DIAGNOSIS — F32A Depression, unspecified: Secondary | ICD-10-CM

## 2024-01-13 NOTE — Telephone Encounter (Signed)
 Patient is due for CPE/follow up, this will be required prior to any further refills.  Please schedule, thank you!

## 2024-01-16 ENCOUNTER — Telehealth: Payer: Self-pay | Admitting: Primary Care

## 2024-01-16 DIAGNOSIS — F419 Anxiety disorder, unspecified: Secondary | ICD-10-CM

## 2024-01-16 MED ORDER — FLUOXETINE HCL 20 MG PO CAPS
20.0000 mg | ORAL_CAPSULE | Freq: Every day | ORAL | 0 refills | Status: DC
Start: 2024-01-16 — End: 2024-03-11

## 2024-01-16 NOTE — Telephone Encounter (Signed)
 Copied from CRM [id number]. Topic: Clinical - Prescription Issue >> Jan 16, 2024  8:59 AM Holly Meyer wrote: Reason for CRM: Patient is calling because her FLUoxetine HCl 20 mg isn't being covered by her insurance. She is wanting to know if it can be written different or what her next steps would be

## 2024-01-16 NOTE — Telephone Encounter (Signed)
 Please call patient:  I switched her from fluoxetine tablets to fluoxetine capsules. Sometimes this is the problem for insurance coverage. Have her check with her pharmacy on the cost for the capsules and call us back if there continues to be a problem.

## 2024-01-16 NOTE — Addendum Note (Signed)
 Addended by: Doreene Nest on: 01/16/2024 04:30 PM   Modules accepted: Orders

## 2024-01-16 NOTE — Telephone Encounter (Signed)
 Unable to reach patient. Left voicemail to return call to our office.

## 2024-01-17 NOTE — Telephone Encounter (Signed)
 Copied from CRM [id number]. Topic: General - Other >> Jan 16, 2024  4:52 PM Melissa C wrote: Reason for CRM: patient called back, I was able to relay message in her chart about fluoxetine tablets vs. capsules. She is going to call her pharmacy and check on cost of capsules and will call back if there is still an issue with her insurance coverage.

## 2024-01-27 ENCOUNTER — Ambulatory Visit: Payer: Self-pay

## 2024-01-27 ENCOUNTER — Ambulatory Visit
Admission: EM | Admit: 2024-01-27 | Discharge: 2024-01-27 | Disposition: A | Discharge status: Home / Self Care | Attending: Family Medicine | Admitting: Family Medicine

## 2024-01-27 DIAGNOSIS — J029 Acute pharyngitis, unspecified: Secondary | ICD-10-CM | POA: Diagnosis not present

## 2024-01-27 DIAGNOSIS — J020 Streptococcal pharyngitis: Secondary | ICD-10-CM

## 2024-01-27 LAB — POCT RAPID STREP A (OFFICE): Rapid Strep A Screen: POSITIVE — AB

## 2024-01-27 MED ORDER — AMOXICILLIN 500 MG PO CAPS: 500.0000 mg | ORAL_CAPSULE | Freq: Two times a day (BID) | ORAL | 0 refills | Status: AC

## 2024-01-27 MED ORDER — PREDNISONE 20 MG PO TABS: 20.0000 mg | ORAL_TABLET | Freq: Two times a day (BID) | ORAL | 0 refills | Status: AC

## 2024-01-27 NOTE — ED Provider Notes (Signed)
 [name]    CSN: [id number] Arrival date & time: 01/27/24  1515      History   Chief Complaint Chief Complaint  Patient presents with   Sore Throat    HPI Holly Meyer is a 28 y.o. female.   With a history of recurrent strep presents today with sore throat x 1 day.  Patient has a history of recurrent strep and last year had her tonsils removed.  This is the first episode she has had of suspected strep since having her tonsils removed.  She denies fever but endorses difficulty swallowing even fluids.  Dors is the sensation of nausea when she is eating as soup feels as if it is coming back up.  She has had no known sick contacts.  Past Medical History:  Diagnosis Date   Anemia    Complication of anesthesia    complaints of nausea and vomitting when wisdom teeth were removed   FHx: diabetes mellitus    GERD (gastroesophageal reflux disease)    Headache    History of miscarriage, currently pregnant, first trimester 08/28/2017   Patient desires vaginal birth after cesarean section (VBAC) 08/15/2021   PONV (postoperative nausea and vomiting)    Septate uterus    Symptomatic cholelithiasis 01/2023   Term pregnancy, repeat 01/15/2022   Venous malformation    Right paraspinal    Patient Active Problem List   Diagnosis Date Noted   Acute cough 07/16/2023   Axillary pain, right 12/18/2022   Epigastric abdominal pain 12/18/2022   Anxiety and depression 12/18/2022   Preventative health care 02/01/2017   Allergic rhinitis 02/28/2016   Headache, migraine 08/02/2015   Back pain 04/28/2015   Venous malformation 02/28/2012    Past Surgical History:  Procedure Laterality Date   CESAREAN SECTION N/A 03/25/2018   Procedure: PRIMARY CESAREAN SECTION;  Surgeon: Zenobia Hila, MD;  Location: [facility];  Service: Obstetrics;  Laterality: N/A;  Female born @ 18     CESAREAN SECTION N/A 01/15/2022   Procedure: CESAREAN SECTION;  Surgeon: Zenobia Hila, MD;   Location: [facility];  Service: Obstetrics;  Laterality: N/A;   CHOLECYSTECTOMY     DILATION AND EVACUATION N/A 01/07/2017   Procedure: DILATATION AND EVACUATION;  Surgeon: Zenobia Hila, MD;  Location: [facility];  Service: Gynecology;  Laterality: N/A;   TONSILLECTOMY Bilateral 05/20/2023   Procedure: TONSILLECTOMY;  Surgeon: Janita Mellow, MD;  Location: [facility];  Service: ENT;  Laterality: Bilateral;   WISDOM TOOTH EXTRACTION  10/15/2010    OB History     Gravida  3   Para  2   Term  2   Preterm  0   AB  1   Living  2      SAB  1   IAB  0   Ectopic  0   Multiple  0   Live Births  2            Home Medications    Prior to Admission medications   Medication Sig Start Date End Date Taking? Authorizing Provider  acetaminophen (TYLENOL) 500 MG tablet Take 2 tablets (1,000 mg total) by mouth every 6 (six) hours as needed for mild pain. 02/21/23  Yes Piscoya, Volanda Gruber, MD  amoxicillin (AMOXIL) 500 MG capsule Take 1 capsule (500 mg total) by mouth 2 (two) times daily for 10 days. 01/27/24 02/06/24 Yes Buena Carmine, NP  predniSONE (DELTASONE) 20 MG tablet Take 1 tablet (20 mg  total) by mouth 2 (two) times daily with a meal for 3 days. 01/27/24 01/30/24 Yes Bing Neighbors, NP  benzonatate (TESSALON) 200 MG capsule Take 1 capsule (200 mg total) by mouth 3 (three) times daily as needed for cough. 07/16/23   Doreene Nest, NP  FLUoxetine (PROZAC) 20 MG capsule Take 1 capsule (20 mg total) by mouth daily. for anxiety and depression. 01/16/24   Doreene Nest, NP  ibuprofen (ADVIL) 800 MG tablet Take 1 tablet (800 mg total) by mouth every 8 (eight) hours as needed for moderate pain. 02/21/23   Henrene Dodge, MD  norethindrone (MICRONOR) 0.35 MG tablet Take 1 tablet (0.35 mg total) by mouth daily. Patient not taking: Reported on 07/16/2023 05/09/23   Copland, Helmut Muster B, PA-C  ondansetron (ZOFRAN-ODT) 4 MG disintegrating tablet Take 1 tablet (4 mg total) by  mouth every 8 (eight) hours as needed for nausea or vomiting. Patient not taking: Reported on 07/16/2023 05/20/23   Serena Colonel, MD    Family History Family History  Problem Relation Age of Onset   Migraines Mother    Diabetes Mother    Hypertension Father    Diabetes Father    Alzheimer's disease Maternal Grandmother    Diabetes Paternal Grandmother        family history   Heart failure Paternal Grandfather    Diabetes Paternal Grandfather     Social History Social History   Tobacco Use   Smoking status: Former    Current packs/day: 0.25    Types: Cigarettes    Passive exposure: Past   Smokeless tobacco: Never   Tobacco comments:    recently quit  Vaping Use   Vaping status: Never Used  Substance Use Topics   Alcohol use: Yes    Comment: socially   Drug use: No     Allergies   Fexofenadine, Citrus, Imitrex [sumatriptan], Guaifenesin, Guaifenesin er, Meloxicam, Naproxen, and Tomato  Review of Systems Review of Systems Pertinent negatives listed in HPI   Physical Exam Triage Vital Signs ED Triage Vitals  Encounter Vitals Group     BP 01/27/24 1534 124/79     Systolic BP Percentile --      Diastolic BP Percentile --      Pulse Rate 01/27/24 1534 (!) 115     Resp 01/27/24 1534 18     Temp 01/27/24 1534 98.8 F (37.1 C)     Temp Source 01/27/24 1534 Oral     SpO2 01/27/24 1534 98 %     Weight --      Height --      Head Circumference --      Peak Flow --      Pain Score 01/27/24 1532 8     Pain Loc --      Pain Education --      Exclude from Growth Chart --    No data found.  Updated Vital Signs BP 124/79 (BP Location: Right Arm)   Pulse (!) 115   Temp 98.8 F (37.1 C) (Oral)   Resp 18   LMP 01/06/2024 (Approximate)   SpO2 98%   Visual Acuity Right Eye Distance:   Left Eye Distance:   Bilateral Distance:    Right Eye Near:   Left Eye Near:    Bilateral Near:     Physical Exam Vitals reviewed.  Constitutional:      Appearance: She is  ill-appearing.  HENT:     Head: Normocephalic and atraumatic.  Right Ear: Tympanic membrane and ear canal normal.     Left Ear: Tympanic membrane and ear canal normal.     Nose: No congestion or rhinorrhea.     Mouth/Throat:     Pharynx: Pharyngeal swelling, oropharyngeal exudate, posterior oropharyngeal erythema and uvula swelling present.     Tonsils: 0 on the right. 0 on the left.  Eyes:     Conjunctiva/sclera: Conjunctivae normal.     Pupils: Pupils are equal, round, and reactive to light.  Cardiovascular:     Rate and Rhythm: Regular rhythm. Tachycardia present.  Pulmonary:     Effort: Pulmonary effort is normal.     Breath sounds: Normal breath sounds.  Musculoskeletal:     Cervical back: Normal range of motion and neck supple.  Skin:    General: Skin is warm and dry.  Neurological:     Mental Status: She is alert.      UC Treatments / Results  Labs (all labs ordered are listed, but only abnormal results are displayed) Labs Reviewed  POCT RAPID STREP A (OFFICE) - Abnormal; Notable for the following components:      Result Value   Rapid Strep A Screen Positive (*)    All other components within normal limits    EKG   Radiology No results found.  Procedures Procedures (including critical care time)  Medications Ordered in UC Medications - No data to display  Initial Impression / Assessment and Plan / UC Course  I have reviewed the triage vital signs and the nursing notes.  Pertinent labs & imaging results that were available during my care of the patient were reviewed by me and considered in my medical decision making (see chart for details).    Acute Pharyngitis, positive rapid strep. Empiric treatment for acute streptococcal infection with amoxicillin 500 mg twice daily for 10 days.  Throat swelling prednisone 20 mg twice daily for 3 days.  Continue efforts to hydrate well with fluids.  Return precautions given if symptoms worsen or do not  improve. Final Clinical Impressions(s) / UC Diagnoses   Final diagnoses:  Acute pharyngitis, unspecified etiology  Acute streptococcal pharyngitis     Discharge Instructions      Take medication as prescribed.  72 hours after being on antibiotics discard toothbrush and replace with a new toothbrush to prevent reinfecting yourself with strep.  Swelling I have prescribed you prednisone 20 mg twice daily for 3 days to reduce the inflammation in help improve related to swallow.  Take ibuprofen or Tylenol as needed for pain.  Gargle with warm salt water can also improve pain symptoms associated with strep.     ED Prescriptions     Medication Sig Dispense Auth. Provider   amoxicillin (AMOXIL) 500 MG capsule Take 1 capsule (500 mg total) by mouth 2 (two) times daily for 10 days. 20 capsule Buena Carmine, NP   predniSONE (DELTASONE) 20 MG tablet Take 1 tablet (20 mg total) by mouth 2 (two) times daily with a meal for 3 days. 6 tablet Buena Carmine, NP      PDMP not reviewed this encounter.   Buena Carmine, NP 01/27/24 [id number]

## 2024-01-27 NOTE — ED Triage Notes (Signed)
Pt presents with sore throat x 1 day.

## 2024-01-27 NOTE — Telephone Encounter (Signed)
 Copied from CRM [id number]. Topic: Clinical - Red Word Triage >> Jan 27, 2024  1:22 PM Holly Meyer wrote: [facility] that prompted transfer to Nurse Triage: Patient called in and scheduled an appt for allergic reaction. Initially thought it was seasonal allergies but now experiencing shooting ear pains and throat swelling/closing and unable to swallow food normally.   Chief Complaint: Sore throat  Symptoms: Sore throat, ear pain, difficulty swallowing Frequency: Constant  Pertinent Negatives: Patient denies fever Disposition: [] ED /[x] Urgent Care (no appt availability in office) / [] Appointment(In office/virtual)/ []  [facility]/ [] Home Care/ [] Refused Recommended Disposition /[] [place] [facility]/ []  Follow-up with PCP Additional Notes: Patient reports that she began to experience a sore throat last night after an "allergy attack." She states that she made an appointment for tomorrow but while attempting to eat soup her pain caused her difficulty swallowing. She states that she was able to swallow but that it takes multiple attempts. Patient also experiencing pain to her ears. She denies any fevers. Patient states she would like to cancel her appointment because she is going to urgent care. Appointment cancelled at her request.    Reason for Disposition  Earache also present  Answer Assessment - Initial Assessment Questions 1. ONSET: "When did the throat start hurting?" (Hours or days ago)      Last night  2. SEVERITY: "How bad is the sore throat?" (Scale 1-10; mild, moderate or severe)   - MILD (1-3):  Doesn't interfere with eating or normal activities.   - MODERATE (4-7): Interferes with eating some solids and normal activities.   - SEVERE (8-10):  Excruciating pain, interferes with most normal activities.   - SEVERE WITH DYSPHAGIA (10): Can't swallow liquids, drooling.     Moderate to severe  3. STREP EXPOSURE: "Has there been any exposure to strep within the past week?" If Yes,  ask: "What type of contact occurred?"      No 4.  VIRAL SYMPTOMS: "Are there any symptoms of a cold, such as a runny nose, cough, hoarse voice or red eyes?"      No 5. FEVER: "Do you have a fever?" If Yes, ask: "What is your temperature, how was it measured, and when did it start?"     No 6. PUS ON THE TONSILS: "Is there pus on the tonsils in the back of your throat?"     No 7. OTHER SYMPTOMS: "Do you have any other symptoms?" (e.g., difficulty breathing, headache, rash)     Shooting pain in ears  8. PREGNANCY: "Is there any chance you are pregnant?" "When was your last menstrual period?"     No  Protocols used: Sore Throat-A-AH

## 2024-01-27 NOTE — Discharge Instructions (Addendum)
 Take medication as prescribed.  72 hours after being on antibiotics discard toothbrush and replace with a new toothbrush to prevent reinfecting yourself with strep.  Swelling I have prescribed you prednisone 20 mg twice daily for 3 days to reduce the inflammation in help improve related to swallow.  Take ibuprofen or Tylenol as needed for pain.  Gargle with warm salt water can also improve pain symptoms associated with strep.

## 2024-01-27 NOTE — Telephone Encounter (Signed)
Noted. Agree with UC evaluation.  

## 2024-01-28 ENCOUNTER — Ambulatory Visit: Admitting: Family Medicine

## 2024-03-11 ENCOUNTER — Encounter (INDEPENDENT_AMBULATORY_CARE_PROVIDER_SITE_OTHER): Payer: Self-pay

## 2024-03-11 DIAGNOSIS — F419 Anxiety disorder, unspecified: Secondary | ICD-10-CM | POA: Diagnosis not present

## 2024-03-11 DIAGNOSIS — F32A Depression, unspecified: Secondary | ICD-10-CM | POA: Diagnosis not present

## 2024-03-11 MED ORDER — FLUOXETINE HCL 40 MG PO CAPS: 40.0000 mg | ORAL_CAPSULE | Freq: Every day | ORAL | 0 refills | Status: DC

## 2024-03-11 NOTE — Telephone Encounter (Signed)

## 2024-04-14 ENCOUNTER — Other Ambulatory Visit: Payer: Self-pay | Admitting: Primary Care

## 2024-04-14 DIAGNOSIS — F419 Anxiety disorder, unspecified: Secondary | ICD-10-CM

## 2024-05-05 ENCOUNTER — Encounter: Admitting: Primary Care

## 2024-06-06 ENCOUNTER — Other Ambulatory Visit: Payer: Self-pay | Admitting: Primary Care

## 2024-06-06 DIAGNOSIS — F419 Anxiety disorder, unspecified: Secondary | ICD-10-CM

## 2024-06-25 ENCOUNTER — Other Ambulatory Visit: Payer: Self-pay | Admitting: Primary Care

## 2024-06-25 ENCOUNTER — Ambulatory Visit: Admitting: Primary Care

## 2024-06-25 ENCOUNTER — Ambulatory Visit: Payer: Self-pay | Admitting: Primary Care

## 2024-06-25 VITALS — BP 118/68 | HR 82 | Temp 98.1°F | Ht 67.0 in | Wt 185.0 lb

## 2024-06-25 DIAGNOSIS — F32A Depression, unspecified: Secondary | ICD-10-CM | POA: Diagnosis not present

## 2024-06-25 DIAGNOSIS — Z6828 Body mass index (BMI) 28.0-28.9, adult: Secondary | ICD-10-CM

## 2024-06-25 DIAGNOSIS — E663 Overweight: Secondary | ICD-10-CM | POA: Diagnosis not present

## 2024-06-25 DIAGNOSIS — Z0000 Encounter for general adult medical examination without abnormal findings: Secondary | ICD-10-CM | POA: Diagnosis not present

## 2024-06-25 DIAGNOSIS — G43909 Migraine, unspecified, not intractable, without status migrainosus: Secondary | ICD-10-CM

## 2024-06-25 DIAGNOSIS — Q279 Congenital malformation of peripheral vascular system, unspecified: Secondary | ICD-10-CM

## 2024-06-25 DIAGNOSIS — Z23 Encounter for immunization: Secondary | ICD-10-CM | POA: Diagnosis not present

## 2024-06-25 DIAGNOSIS — F419 Anxiety disorder, unspecified: Secondary | ICD-10-CM

## 2024-06-25 DIAGNOSIS — R7303 Prediabetes: Secondary | ICD-10-CM

## 2024-06-25 LAB — COMPREHENSIVE METABOLIC PANEL WITH GFR
ALT: 21 U/L (ref 0–35)
AST: 20 U/L (ref 0–37)
Albumin: 4.7 g/dL (ref 3.5–5.2)
Alkaline Phosphatase: 41 U/L (ref 39–117)
BUN: 15 mg/dL (ref 6–23)
CO2: 30 meq/L (ref 19–32)
Calcium: 9.7 mg/dL (ref 8.4–10.5)
Chloride: 100 meq/L (ref 96–112)
Creatinine, Ser: 0.8 mg/dL (ref 0.40–1.20)
GFR: 99.69 mL/min (ref >60.00)
Glucose, Bld: 90 mg/dL (ref 70–99)
Potassium: 4.5 meq/L (ref 3.5–5.1)
Sodium: 137 meq/L (ref 135–145)
Total Bilirubin: 0.4 mg/dL (ref 0.2–1.2)
Total Protein: 7.4 g/dL (ref 6.0–8.3)

## 2024-06-25 LAB — LIPID PANEL
Cholesterol: 212 mg/dL — ABNORMAL HIGH (ref 0–200)
HDL: 51.5 mg/dL (ref >39.00)
LDL Cholesterol: 116 mg/dL — ABNORMAL HIGH (ref 0–99)
NonHDL: 160.75
Total CHOL/HDL Ratio: 4
Triglycerides: 226 mg/dL — ABNORMAL HIGH (ref 0.0–149.0)
VLDL: 45.2 mg/dL — ABNORMAL HIGH (ref 0.0–40.0)

## 2024-06-25 LAB — HEMOGLOBIN A1C: Hgb A1c MFr Bld: 5.8 % (ref 4.6–6.5)

## 2024-06-25 MED ORDER — FLUOXETINE HCL 40 MG PO CAPS: 40.0000 mg | ORAL_CAPSULE | Freq: Every day | ORAL | 3 refills | Status: AC

## 2024-06-25 NOTE — Assessment & Plan Note (Signed)
 Immunizations UTD. Influenza vaccine provided today. Pap smear UTD  Discussed the importance of a healthy diet and regular exercise in order for weight loss, and to reduce the risk of further co-morbidity.  Exam stable. Labs pending.  Follow up in 1 year for repeat physical.

## 2024-06-25 NOTE — Assessment & Plan Note (Signed)
 Controlled.  ?Continue fluoxetine 40 mg daily. ?

## 2024-06-25 NOTE — Assessment & Plan Note (Signed)
 Last MRI was in 2016.  We agreed to update the MRI today.  Orders placed.

## 2024-06-25 NOTE — Progress Notes (Signed)
 Subjective:    Patient ID: Holly Meyer, female    DOB: 1995-12-09, 28 y.o.   MRN: [id number]  Holly Meyer is a very pleasant 28 y.o. female who presents today for complete physical and follow up of chronic conditions.  She would also like to discuss weight gain. Weight gain of 20 pounds over the last 1 year. She admits to over eating and eating poorly.   Immunizations: -Tetanus: Completed in 2023 -Influenza: Influenza vaccine provided today.  -HPV: Completed series   Eye exam: Completed 1 year ago Dental exam: Completes semi-annually    Pap Smear: Completed in 2024   Diet currently consists of:  Breakfast:Skips, cereal, fast food Lunch: Peanut butter and jelly, fruit, chips Dinner: Restaurants, pasta, rice, protein, veggies (corn, potatoes) Snacks: Peanut butter crackers, fruit, jello, chips Desserts: Infrequent  Beverages: Water, sweet tea, muscadine juice, starbucks 2 times weekly now, was daily  Exercise: Several days  Wt Readings from Last 3 Encounters:  06/25/24 185 lb (83.9 kg)  07/16/23 165 lb (74.8 kg)  05/20/23 162 lb 7.7 oz (73.7 kg)   BP Readings from Last 3 Encounters:  06/25/24 118/68  01/27/24 124/79  07/16/23 124/78   Body mass index is 28.98 kg/m.   Review of Systems  Constitutional:  Negative for unexpected weight change.  HENT:  Negative for rhinorrhea.   Respiratory:  Negative for cough and shortness of breath.   Cardiovascular:  Negative for chest pain.  Gastrointestinal:  Negative for constipation and diarrhea.  Genitourinary:  Negative for difficulty urinating and menstrual problem.  Musculoskeletal:  Negative for arthralgias and myalgias.  Skin:  Negative for rash.  Allergic/Immunologic: Negative for environmental allergies.  Neurological:  Negative for dizziness and headaches.  Psychiatric/Behavioral:  The patient is not nervous/anxious.          Past Medical History:  Diagnosis Date   Anemia    Complication of  anesthesia    complaints of nausea and vomitting when wisdom teeth were removed   FHx: diabetes mellitus    GERD (gastroesophageal reflux disease)    Headache    History of miscarriage, currently pregnant, first trimester 08/28/2017   Patient desires vaginal birth after cesarean section (VBAC) 08/15/2021   PONV (postoperative nausea and vomiting)    Septate uterus    Symptomatic cholelithiasis 01/2023   Term pregnancy, repeat 01/15/2022   Venous malformation    Right paraspinal    Social History   Socioeconomic History   Marital status: Married    Spouse name: Mose   Number of children: 2   Years of education: [employer] education level: Some college, no degree  Occupational History   Not on file  Tobacco Use   Smoking status: Former    Current packs/day: 0.25    Types: Cigarettes    Passive exposure: Past   Smokeless tobacco: Never   Tobacco comments:    recently quit  Vaping Use   Vaping status: Never Used  Substance and Sexual Activity   Alcohol use: Yes    Comment: socially   Drug use: No   Sexual activity: Yes    Partners: Male    Birth control/protection: None  Other Topics Concern   Not on file  Social History Narrative   Married   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 She is currently in school for business.   Works at [employer].    Enjoys swimming, watching movies.   Social Drivers of Health  Financial Resource Strain: Medium Risk (06/25/2024)   Overall Financial Resource Strain (CARDIA)    Difficulty of Paying Living Expenses: Somewhat hard  Food Insecurity: Food Insecurity Present (06/25/2024)   Hunger Vital Sign    Worried About Running Out of Food in the Last Year: Sometimes true    Ran Out of Food in the Last Year: Patient declined  Transportation Needs: No Transportation Needs (06/25/2024)   PRAPARE - Administrator, Civil Service (Medical): No    Lack of Transportation (Non-Medical): No   Physical Activity: Insufficiently Active (06/25/2024)   Exercise Vital Sign    Days of Exercise per Week: 2 days    Minutes of Exercise per Session: 30 min  Stress: Stress Concern Present (06/25/2024)   [employer] of Occupational Health - Occupational Stress Questionnaire    Feeling of Stress: To some extent  Social Connections: Moderately Integrated (06/25/2024)   Social Connection and Isolation Panel    Frequency of Communication with Friends and Family: More than three times a week    Frequency of Social Gatherings with Friends and Family: More than three times a week    Attends Religious Services: More than 4 times per year    Active Member of [employer] or Organizations: No    Attends Engineer, structural: Not on file    Marital Status: Married  Catering manager Violence: Not on file    Past Surgical History:  Procedure Laterality Date   CESAREAN SECTION N/A 03/25/2018   Procedure: PRIMARY CESAREAN SECTION;  Surgeon: Janit Alm Agent, MD;  Location: [facility];  Service: Obstetrics;  Laterality: N/A;  Female born @ 33     CESAREAN SECTION N/A 01/15/2022   Procedure: CESAREAN SECTION;  Surgeon: Janit Alm Agent, MD;  Location: [facility];  Service: Obstetrics;  Laterality: N/A;   CHOLECYSTECTOMY     DILATION AND EVACUATION N/A 01/07/2017   Procedure: DILATATION AND EVACUATION;  Surgeon: Alm Agent Janit, MD;  Location: [facility];  Service: Gynecology;  Laterality: N/A;   TONSILLECTOMY Bilateral 05/20/2023   Procedure: TONSILLECTOMY;  Surgeon: Jesus Oliphant, MD;  Location: [facility];  Service: ENT;  Laterality: Bilateral;   WISDOM TOOTH EXTRACTION  10/15/2010    Family History  Problem Relation Age of Onset   Migraines Mother    Diabetes Mother    Hypertension Father    Diabetes Father    Alzheimer's disease Maternal Grandmother    Diabetes Paternal Grandmother        family history   Heart failure Paternal Grandfather    Diabetes Paternal  Grandfather     Allergies  Allergen Reactions   Fexofenadine Shortness Of Breath and Other (See Comments)    Respiratory Distress (ALLERGY/intolerance)   Citrus Hives   Imitrex  [Sumatriptan ] Other (See Comments)    Symptoms worsened   Guaifenesin Other (See Comments)    Dizziness (intolerance)   Guaifenesin Er Other (See Comments)    Dizziness   Meloxicam Other (See Comments)    Unknown   Naproxen Other (See Comments)    Loss of appiteite   Tomato Rash    Tongue gets sores     Current Outpatient Medications on File Prior to Visit  Medication Sig Dispense Refill   acetaminophen  (TYLENOL ) 500 MG tablet Take 2 tablets (1,000 mg total) by mouth every 6 (six) hours as needed for mild pain.     ibuprofen  (ADVIL ) 800 MG tablet Take 1 tablet (800 mg total) by mouth every 8 (eight)  hours as needed for moderate pain. 60 tablet 1   No current facility-administered medications on file prior to visit.    BP 118/68   Pulse 82   Temp 98.1 F (36.7 C) (Temporal)   Ht 5' 7 (1.702 m)   Wt 185 lb (83.9 kg)   LMP 06/22/2024   SpO2 98%   BMI 28.98 kg/m  Objective:   Physical Exam HENT:     Right Ear: Tympanic membrane and ear canal normal.     Left Ear: Tympanic membrane and ear canal normal.  Eyes:     Pupils: Pupils are equal, round, and reactive to light.  Cardiovascular:     Rate and Rhythm: Normal rate and regular rhythm.  Pulmonary:     Effort: Pulmonary effort is normal.     Breath sounds: Normal breath sounds.  Abdominal:     General: Bowel sounds are normal.     Palpations: Abdomen is soft.     Tenderness: There is no abdominal tenderness.  Musculoskeletal:        General: Normal range of motion.     Cervical back: Neck supple.  Skin:    General: Skin is warm and dry.  Neurological:     Mental Status: She is alert and oriented to person, place, and time.     Cranial Nerves: No cranial nerve deficit.     Deep Tendon Reflexes:     Reflex Scores:      Patellar  reflexes are 2+ on the right side and 2+ on the left side. Psychiatric:        Mood and Affect: Mood normal.     Physical Exam        Assessment & Plan:  Preventative health care Assessment & Plan: Immunizations UTD. Influenza vaccine provided today.  Pap smear UTD.  Discussed the importance of a healthy diet and regular exercise in order for weight loss, and to reduce the risk of further co-morbidity.  Exam stable. Labs pending.  Follow up in 1 year for repeat physical.    Anxiety and depression Assessment & Plan: Controlled.  Continue fluoxetine  40 mg daily.  Orders: -     FLUoxetine  HCl; Take 1 capsule (40 mg total) by mouth daily. For anxiety  Dispense: 90 capsule; Refill: 3  Overweight with body mass index (BMI) of 28 to 28.9 in adult -     Lipid panel -     Comprehensive metabolic panel with GFR -     Hemoglobin A1c  Migraine without status migrainosus, not intractable, unspecified migraine type Assessment & Plan: Controlled.  Continue to monitor.    Venous malformation Assessment & Plan: Last MRI was in 2016.  We agreed to update the MRI today.  Orders placed.   Orders: -     MR THORACIC SPINE W WO CONTRAST; Future    Assessment and Plan Assessment & Plan         Comer MARLA Gaskins, NP    History of Present Illness

## 2024-06-25 NOTE — Assessment & Plan Note (Signed)
 Controlled.  Continue to monitor.

## 2024-06-25 NOTE — Patient Instructions (Addendum)
 Stop by the lab prior to leaving today. I will notify you of your results once received.   You will receive a phone call regarding the MRI.  It was a pleasure to see you today!

## 2024-06-29 DIAGNOSIS — F40240 Claustrophobia: Secondary | ICD-10-CM

## 2024-06-29 MED ORDER — DIAZEPAM 5 MG PO TABS: ORAL_TABLET | ORAL | 0 refills | Status: DC

## 2024-07-03 ENCOUNTER — Telehealth: Payer: Self-pay | Admitting: Primary Care

## 2024-07-03 NOTE — Telephone Encounter (Signed)
 Have her schedule something with me in 3 weeks

## 2024-07-03 NOTE — Telephone Encounter (Signed)
 Copied from CRM [id number]. Topic: Clinical - Medical Advice >> Jul 03, 2024  3:51 PM [place] A wrote: Reason for CRM: Patient would like to know how long does it take for MRI results so that she can scheduled follow up appt

## 2024-07-07 ENCOUNTER — Ambulatory Visit
Admission: RE | Admit: 2024-07-07 | Discharge: 2024-07-07 | Disposition: A | Discharge status: Home / Self Care | Source: Ambulatory Visit | Attending: Primary Care

## 2024-07-07 DIAGNOSIS — Q279 Congenital malformation of peripheral vascular system, unspecified: Secondary | ICD-10-CM

## 2024-07-07 MED ORDER — GADOPICLENOL 0.5 MMOL/ML IV SOLN
10.0000 mL | Freq: Once | INTRAVENOUS | Status: AC | PRN
  Administered 2024-07-07: 9 mL via INTRAVENOUS

## 2024-07-29 ENCOUNTER — Ambulatory Visit: Admitting: Primary Care

## 2024-08-19 ENCOUNTER — Encounter: Payer: Self-pay | Admitting: Emergency Medicine

## 2024-08-19 ENCOUNTER — Ambulatory Visit: Payer: Self-pay

## 2024-08-19 ENCOUNTER — Ambulatory Visit: Admission: EM | Admit: 2024-08-19 | Discharge: 2024-08-19 | Disposition: A | Discharge status: Home / Self Care

## 2024-08-19 DIAGNOSIS — J069 Acute upper respiratory infection, unspecified: Secondary | ICD-10-CM

## 2024-08-19 LAB — POC COVID19/FLU A&B COMBO
Covid Antigen, POC: NEGATIVE
Influenza A Antigen, POC: NEGATIVE
Influenza B Antigen, POC: NEGATIVE

## 2024-08-19 NOTE — Telephone Encounter (Signed)
 FYI Only or Action Required?: FYI only for provider: UC/ER.  Patient was last seen in primary care on 06/25/2024 by Gretta Comer POUR, NP.  Called Nurse Triage reporting Hypertension and Palpitations.  Symptoms began today.  Interventions attempted: Nothing.  Symptoms are: unchanged.  Triage Disposition: See HCP Within 4 Hours (Or PCP Triage)  Patient/caregiver understands and will follow disposition?: Yes     Copied from CRM [id number]. Topic: Clinical - Red Word Triage >> Aug 19, 2024 10:08 AM Alfonso HERO wrote: Red Word that prompted transfer to Nurse Triage: elevated bp, head cold and feels like heart is coming out of her chest. Reason for Disposition  [1] Heart beating very rapidly (e.g., > 140 / minute) AND [2] NOT present now  (Exception: Only during exercise.)  Answer Assessment - Initial Assessment Questions Pt states she took Theraflu about 5am and shortly after her heart pounding fast- first time taking theraflu. She states it does take her breath away at times.   Bp- automatic cuff 180 Head cold symptoms- yesterday No appts available for today. RN advised Er or UC to be evaluated.     1. DESCRIPTION: Please describe your heart rate or heartbeat that you are having (e.g., fast/slow, regular/irregular, skipped or extra beats, palpitations)     Fast she states even sitting doing nothing it's 90 2. ONSET: When did it start? (e.g., minutes, hours, days)      Pt states this am after taking theraflu 3. DURATION: How long does it last (e.g., seconds, minutes, hours)     Ongoing since then 4. PATTERN Does it come and go, or has it been constant since it started?  Does it get worse with exertion?   Are you feeling it now?     ongoing  6. HEART RATE: Can you tell me your heart rate? How many beats in 15 seconds?  Note: Not all patients can do this.       90 per her apple watch 7. RECURRENT SYMPTOM: Have you ever had this before? If Yes, ask: When was the  last time? and What happened that time?      yes 8. CAUSE: What do you think is causing the palpitations?     She states sometimes this happens when she has strep 9. CARDIAC HISTORY: Do you have any history of heart disease? (e.g., heart attack, angina, bypass surgery, angioplasty, arrhythmia)      no 10. OTHER SYMPTOMS: Do you have any other symptoms? (e.g., dizziness, chest pain, sweating, difficulty breathing)       Shortness of breath  Protocols used: Heart Rate and Heartbeat Questions-A-AH

## 2024-08-19 NOTE — Telephone Encounter (Signed)
 FYI. Pt going to ED or UC for eval.

## 2024-08-19 NOTE — Discharge Instructions (Signed)

## 2024-08-19 NOTE — ED Triage Notes (Signed)
 Pt presents c/o stuffiness and heart palpitations x 2 days. Pt states,  Last night it started with the stuffiness and thought it would go away but it didn't. I took some theraflu but it didn't help. My heart started racing and feels like its about to jump out of my chest after I took the theraflu. I had spoke with my provider over the phone and was advised to discontinue the theraflu and come in to be seen.  Pt denies emesis and diarrhea.

## 2024-08-19 NOTE — ED Provider Notes (Signed)
 [name]    CSN: [id number] Arrival date & time: 08/19/24  1557      History   Chief Complaint Chief Complaint  Patient presents with   Nasal Congestion    HPI Holly Meyer is a 28 y.o. female.   Patient presents today due to 2 days worth of nasal congestion and throat pain.  Patient states that she attempted to take TheraFlu but she started to get palpitations.  Patient states that she has taken TheraFlu without issue in the past but states that she was not on any other chronic medications that she is on now.  Patient denies fever, chills, nausea, or vomiting, or change in appetite.  Patient reports known sick contacts.  The history is provided by the patient.    Past Medical History:  Diagnosis Date   Anemia    Complication of anesthesia    complaints of nausea and vomitting when wisdom teeth were removed   FHx: diabetes mellitus    GERD (gastroesophageal reflux disease)    Headache    History of miscarriage, currently pregnant, first trimester 08/28/2017   Patient desires vaginal birth after cesarean section (VBAC) 08/15/2021   PONV (postoperative nausea and vomiting)    Septate uterus    Symptomatic cholelithiasis 01/2023   Term pregnancy, repeat 01/15/2022   Venous malformation    Right paraspinal    Patient Active Problem List   Diagnosis Date Noted   Anxiety and depression 12/18/2022   Preventative health care 02/01/2017   Allergic rhinitis 02/28/2016   Headache, migraine 08/02/2015   Venous malformation 02/28/2012    Past Surgical History:  Procedure Laterality Date   CESAREAN SECTION N/A 03/25/2018   Procedure: PRIMARY CESAREAN SECTION;  Surgeon: Janit Alm Agent, MD;  Location: [facility];  Service: Obstetrics;  Laterality: N/A;  Female born @ 6     CESAREAN SECTION N/A 01/15/2022   Procedure: CESAREAN SECTION;  Surgeon: Janit Alm Agent, MD;  Location: [facility];  Service: Obstetrics;  Laterality: N/A;   CHOLECYSTECTOMY      DILATION AND EVACUATION N/A 01/07/2017   Procedure: DILATATION AND EVACUATION;  Surgeon: Alm Agent Janit, MD;  Location: [facility];  Service: Gynecology;  Laterality: N/A;   TONSILLECTOMY Bilateral 05/20/2023   Procedure: TONSILLECTOMY;  Surgeon: Jesus Oliphant, MD;  Location: [facility];  Service: ENT;  Laterality: Bilateral;   WISDOM TOOTH EXTRACTION  10/15/2010    OB History     Gravida  3   Para  2   Term  2   Preterm  0   AB  1   Living  2      SAB  1   IAB  0   Ectopic  0   Multiple  0   Live Births  2            Home Medications    Prior to Admission medications   Medication Sig Start Date End Date Taking? Authorizing Provider  FLUoxetine  (PROZAC ) 40 MG capsule Take 1 capsule (40 mg total) by mouth daily. For anxiety 06/25/24  Yes Gretta Comer POUR, NP  OVER THE COUNTER MEDICATION Theraflu   Yes [provider]  acetaminophen  (TYLENOL ) 500 MG tablet Take 2 tablets (1,000 mg total) by mouth every 6 (six) hours as needed for mild pain. 02/21/23   Desiderio Schanz, MD  diazepam  (VALIUM ) 5 MG tablet Take 1-2 tablet my mouth 30-60 minutes prior to MRI. 06/29/24   Clark, Katherine K, NP  ibuprofen  (ADVIL ) 800 MG tablet Take 1 tablet (800 mg total) by mouth every 8 (eight) hours as needed for moderate pain. 02/21/23   Desiderio Schanz, MD    Family History Family History  Problem Relation Age of Onset   Migraines Mother    Diabetes Mother    Hypertension Father    Diabetes Father    Alzheimer's disease Maternal Grandmother    Diabetes Paternal Grandmother        family history   Heart failure Paternal Grandfather    Diabetes Paternal Grandfather     Social History Social History   Tobacco Use   Smoking status: Former    Current packs/day: 0.25    Types: Cigarettes    Passive exposure: Past   Smokeless tobacco: Never   Tobacco comments:    recently quit  Vaping Use   Vaping status: Never Used  Substance Use Topics   Alcohol use:  Yes    Comment: socially   Drug use: No     Allergies   Fexofenadine, Citrus, Imitrex  [sumatriptan ], Guaifenesin, Guaifenesin er, Meloxicam, Naproxen, and Tomato   Review of Systems Review of Systems   Physical Exam Triage Vital Signs ED Triage Vitals  Encounter Vitals Group     BP 08/19/24 1652 118/83     Girls Systolic BP Percentile --      Girls Diastolic BP Percentile --      Boys Systolic BP Percentile --      Boys Diastolic BP Percentile --      Pulse Rate 08/19/24 1652 (!) 109     Resp 08/19/24 1652 18     Temp 08/19/24 1652 98.1 F (36.7 C)     Temp Source 08/19/24 1652 Oral     SpO2 08/19/24 1652 96 %     Weight 08/19/24 1651 184 lb 15.5 oz (83.9 kg)     Height --      Head Circumference --      Peak Flow --      Pain Score 08/19/24 1649 4     Pain Loc --      Pain Education --      Exclude from Growth Chart --    No data found.  Updated Vital Signs BP 118/83 (BP Location: Left Arm)   Pulse (!) 109   Temp 98.1 F (36.7 C) (Oral)   Resp 18   Wt 184 lb 15.5 oz (83.9 kg)   LMP 07/27/2024 (Approximate)   SpO2 96%   BMI 28.97 kg/m   Visual Acuity Right Eye Distance:   Left Eye Distance:   Bilateral Distance:    Right Eye Near:   Left Eye Near:    Bilateral Near:     Physical Exam Vitals and nursing note reviewed.  Constitutional:      General: She is not in acute distress.    Appearance: Normal appearance. She is not ill-appearing, toxic-appearing or diaphoretic.  HENT:     Nose: Congestion (moderately enlarged turbinates) present. No rhinorrhea.     Mouth/Throat:     Mouth: Mucous membranes are moist.     Pharynx: Oropharynx is clear. No oropharyngeal exudate or posterior oropharyngeal erythema.  Eyes:     General: No scleral icterus. Cardiovascular:     Rate and Rhythm: Normal rate and regular rhythm.     Heart sounds: Normal heart sounds.  Pulmonary:     Effort: Pulmonary effort is normal. No respiratory distress.     Breath sounds:  Normal breath  sounds. No wheezing or rhonchi.  Skin:    General: Skin is warm.  Neurological:     Mental Status: She is alert and oriented to person, place, and time.  Psychiatric:        Mood and Affect: Mood normal.        Behavior: Behavior normal.      UC Treatments / Results  Labs (all labs ordered are listed, but only abnormal results are displayed) Labs Reviewed  POC COVID19/FLU A&B COMBO - Normal    EKG   Radiology No results found.  Procedures Procedures (including critical care time)  Medications Ordered in UC Medications - No data to display  Initial Impression / Assessment and Plan / UC Course  I have reviewed the triage vital signs and the nursing notes.  Pertinent labs & imaging results that were available during my care of the patient were reviewed by me and considered in my medical decision making (see chart for details).      Final Clinical Impressions(s) / UC Diagnoses   Final diagnoses:  Viral URI     Discharge Instructions      You been diagnosed with a viral illness today. -Viruses have to run their course and medicines that are prescribed are meant to help with symptoms. - With viruses usually feel poorly from 3 to 7 days with cough being the last symptoms to resolve.  -Cough can linger from days to weeks.  Antibiotics are not effective for viruses. -If your cough lasts more than 2 weeks and you are coughing so hard that you are vomiting or feel like you could pass out we need to follow-up with PCP for further testing and evaluation. -Rest, increase water intake, may use pseudoephedrine for nasal congestion, Delsym (dextromethorphan) or honey as needed for cough, and ibuprofen  and/or Tylenol  as directed on packaging for pain and fever. -If you have hypertension you should take Coricidin or other OTC meds approved for people with high blood pressure. -You may use a spoonful of honey every 4-6 hours as needed for throat pain and  cough. -Warm tea with honey and lemon are helpful for soothe throat as well.  Chloraseptic and Cepacol make a throat lozenge with numbing medication, can be purchased over-the-counter. -May also use Flonase or sinus rinse for sinus pressure or nasal congestion.  Be sure to use distilled bottled water for sinus rinses. -May use coolmist humidifier to open up nasal passages -May elevate head to assist with postnasal drainage. -If you feel poorly (fever, fatigue, shortness of breath, nausea, etc.) for more than 10 days to be sure to follow-up with PCP or in clinic for further evaluation and additional treatments. If you experience chest pain with shortness of breath or pulse oxygen less than 95% you should report to the ER.     ED Prescriptions   None    PDMP not reviewed this encounter.   Andra Corean BROCKS, PA-C 08/19/24 1744

## 2024-09-24 ENCOUNTER — Encounter: Payer: Self-pay | Admitting: Primary Care

## 2024-09-24 ENCOUNTER — Other Ambulatory Visit (INDEPENDENT_AMBULATORY_CARE_PROVIDER_SITE_OTHER)

## 2024-09-24 ENCOUNTER — Ambulatory Visit: Payer: Self-pay | Admitting: Primary Care

## 2024-09-24 DIAGNOSIS — R7303 Prediabetes: Secondary | ICD-10-CM | POA: Diagnosis not present

## 2024-09-24 LAB — POCT GLYCOSYLATED HEMOGLOBIN (HGB A1C): Hemoglobin A1C: 5.5 % (ref 4.0–5.6)

## 2024-10-06 ENCOUNTER — Ambulatory Visit (INDEPENDENT_AMBULATORY_CARE_PROVIDER_SITE_OTHER): Admitting: Obstetrics & Gynecology

## 2024-10-06 ENCOUNTER — Encounter: Payer: Self-pay | Admitting: Obstetrics and Gynecology

## 2024-10-06 ENCOUNTER — Encounter: Payer: Self-pay | Admitting: Obstetrics & Gynecology

## 2024-10-06 VITALS — BP 115/78 | HR 103 | Ht 67.0 in | Wt 192.0 lb

## 2024-10-06 DIAGNOSIS — N946 Dysmenorrhea, unspecified: Secondary | ICD-10-CM | POA: Diagnosis not present

## 2024-10-06 MED ORDER — NORGESTREL-ETHINYL ESTRADIOL 0.3-30 MG-MCG PO TABS: 1.0000 | ORAL_TABLET | Freq: Every day | ORAL | 5 refills | Status: AC

## 2024-10-06 MED ORDER — IBUPROFEN 800 MG PO TABS: 800.0000 mg | ORAL_TABLET | Freq: Three times a day (TID) | ORAL | 1 refills | Status: AC | PRN

## 2024-10-06 NOTE — Telephone Encounter (Signed)
 Has appointment with Dr. Starla today 10/06/24.

## 2024-10-06 NOTE — Progress Notes (Signed)
" ° ° °  GYNECOLOGY PROGRESS NOTE  Subjective:    Patient ID: Holly Meyer, female    DOB: [birth year], 28 y.o.   MRN: [id number]  HPI  Patient is a 28 y.o.married  G3P2012 (2 and 84 yo kids) here today to discuss severe pain with this period which started yesterday. Her periods are usually monthly lasting 5-7 days with some pain, but not to the degree to the pain she is having today. She took 600 mg IBU around 10 am today.   She is using withdrawal for contraception. She would be fine if she gets pregnant. She is not taking any vitamins.   The following portions of the patient's history were reviewed and updated as appropriate: allergies, current medications, past family history, past medical history, past social history, past surgical history, and problem list.  Review of Systems Pertinent items are noted in HPI.  Pap was normal 05/02/2023.  Objective:   Blood pressure 115/78, pulse (!) 103, height 5' 7 (1.702 m), weight 192 lb (87.1 kg), last menstrual period 10/05/2024. Body mass index is 30.07 kg/m. Well nourished, well hydrated White female, no apparent distress She is ambulating and conversing normally. EG- normal Graves speculum used. Normal appearance of vault and cervix. Small amount of BRBlood Bimanual- normal size and shape, anteverted uterus, normal adnexal exam     Assessment:   dysmenorrhea Contraception- She does want to restart OCPs. Plan:   IBU 800 mg q 8 hours prn, add tylenol  1 gram BID Rec MVI daily. Lo ovral  start today "

## 2024-10-19 ENCOUNTER — Ambulatory Visit
Admission: EM | Admit: 2024-10-19 | Discharge: 2024-10-19 | Disposition: A | Discharge status: Home / Self Care | Attending: Emergency Medicine | Admitting: Emergency Medicine

## 2024-10-19 DIAGNOSIS — H6692 Otitis media, unspecified, left ear: Secondary | ICD-10-CM

## 2024-10-19 MED ORDER — AMOXICILLIN 875 MG PO TABS: 875.0000 mg | ORAL_TABLET | Freq: Two times a day (BID) | ORAL | 0 refills | Status: AC

## 2024-10-19 NOTE — ED Provider Notes (Signed)
 " [facility]    CSN: [id number] Arrival date & time: 10/19/24  1904      History   Chief Complaint Chief Complaint  Patient presents with   Otalgia    HPI Holly Meyer is a 29 y.o. female.  Patient presents with left ear discomfort and muffled hearing x 2 weeks.  She has had some postnasal drainage.  No fever, ear drainage, cough, shortness of breath.  No OTC medications taken today.  The history is provided by the patient and medical records.    Past Medical History:  Diagnosis Date   Anemia    Complication of anesthesia    complaints of nausea and vomitting when wisdom teeth were removed   FHx: diabetes mellitus    GERD (gastroesophageal reflux disease)    Headache    History of miscarriage, currently pregnant, first trimester 08/28/2017   Patient desires vaginal birth after cesarean section (VBAC) 08/15/2021   PONV (postoperative nausea and vomiting)    Septate uterus    Symptomatic cholelithiasis 01/2023   Term pregnancy, repeat 01/15/2022   Venous malformation    Right paraspinal    Patient Active Problem List   Diagnosis Date Noted   Anxiety and depression 12/18/2022   Preventative health care 02/01/2017   Allergic rhinitis 02/28/2016   Headache, migraine 08/02/2015   Venous malformation 02/28/2012    Past Surgical History:  Procedure Laterality Date   CESAREAN SECTION N/A 03/25/2018   Procedure: PRIMARY CESAREAN SECTION;  Surgeon: Janit Alm Agent, MD;  Location: [facility];  Service: Obstetrics;  Laterality: N/A;  Female born @ 27     CESAREAN SECTION N/A 01/15/2022   Procedure: CESAREAN SECTION;  Surgeon: Janit Alm Agent, MD;  Location: [facility];  Service: Obstetrics;  Laterality: N/A;   CHOLECYSTECTOMY     DILATION AND EVACUATION N/A 01/07/2017   Procedure: DILATATION AND EVACUATION;  Surgeon: Alm Agent Janit, MD;  Location: [facility];  Service: Gynecology;  Laterality: N/A;   TONSILLECTOMY Bilateral 05/20/2023   Procedure:  TONSILLECTOMY;  Surgeon: Jesus Oliphant, MD;  Location: [facility];  Service: ENT;  Laterality: Bilateral;   WISDOM TOOTH EXTRACTION  10/15/2010    OB History     Gravida  3   Para  2   Term  2   Preterm  0   AB  1   Living  2      SAB  1   IAB  0   Ectopic  0   Multiple  0   Live Births  2            Home Medications    Prior to Admission medications  Medication Sig Start Date End Date Taking? Authorizing Provider  amoxicillin  (AMOXIL ) 875 MG tablet Take 1 tablet (875 mg total) by mouth 2 (two) times daily for 10 days. 10/19/24 10/29/24 Yes Corlis Burnard DEL, NP  acetaminophen  (TYLENOL ) 500 MG tablet Take 2 tablets (1,000 mg total) by mouth every 6 (six) hours as needed for mild pain. 02/21/23   Desiderio Schanz, MD  FLUoxetine  (PROZAC ) 40 MG capsule Take 1 capsule (40 mg total) by mouth daily. For anxiety 06/25/24   Gretta Comer POUR, NP  ibuprofen  (ADVIL ) 800 MG tablet Take 1 tablet (800 mg total) by mouth every 8 (eight) hours as needed for moderate pain. 02/21/23   Desiderio Schanz, MD  ibuprofen  (ADVIL ) 800 MG tablet Take 1 tablet (800 mg total) by mouth every 8 (eight) hours as needed.  10/06/24   Dove, Myra C, MD  norgestrel -ethinyl estradiol  (LO/OVRAL ) 0.3-30 MG-MCG tablet Take 1 tablet by mouth daily. 10/06/24   Starla Harland BROCKS, MD    Family History Family History  Problem Relation Age of Onset   Migraines Mother    Diabetes Mother    Hypertension Father    Diabetes Father    Alzheimer's disease Maternal Grandmother    Diabetes Paternal Grandmother        family history   Heart failure Paternal Grandfather    Diabetes Paternal Grandfather     Social History Social History[1]   Allergies   Fexofenadine, Citrus, Imitrex  [sumatriptan ], Guaifenesin, Guaifenesin er, Meloxicam, Naproxen, and Tomato   Review of Systems Review of Systems  Constitutional:  Negative for chills and fever.  HENT:  Positive for ear pain, hearing loss and postnasal drip.  Negative for sore throat.   Respiratory:  Negative for cough and shortness of breath.      Physical Exam Triage Vital Signs ED Triage Vitals  Encounter Vitals Group     BP 10/19/24 1948 126/81     Girls Systolic BP Percentile --      Girls Diastolic BP Percentile --      Boys Systolic BP Percentile --      Boys Diastolic BP Percentile --      Pulse Rate 10/19/24 1948 90     Resp 10/19/24 1948 18     Temp 10/19/24 1948 98.1 F (36.7 C)     Temp src --      SpO2 10/19/24 1948 97 %     Weight --      Height --      Head Circumference --      Peak Flow --      Pain Score 10/19/24 1950 0     Pain Loc --      Pain Education --      Exclude from Growth Chart --    No data found.  Updated Vital Signs BP 126/81   Pulse 90   Temp 98.1 F (36.7 C)   Resp 18   LMP 10/05/2024 (Exact Date)   SpO2 97%   Visual Acuity Right Eye Distance:   Left Eye Distance:   Bilateral Distance:    Right Eye Near:   Left Eye Near:    Bilateral Near:     Physical Exam Constitutional:      General: She is not in acute distress. HENT:     Right Ear: Tympanic membrane and ear canal normal.     Left Ear: Ear canal normal. Tympanic membrane is erythematous.     Nose: Nose normal.     Mouth/Throat:     Mouth: Mucous membranes are moist.     Pharynx: Oropharynx is clear.  Cardiovascular:     Rate and Rhythm: Normal rate and regular rhythm.     Heart sounds: Normal heart sounds.  Pulmonary:     Effort: Pulmonary effort is normal. No respiratory distress.     Breath sounds: Normal breath sounds.  Neurological:     Mental Status: She is alert.      UC Treatments / Results  Labs (all labs ordered are listed, but only abnormal results are displayed) Labs Reviewed - No data to display  EKG   Radiology No results found.  Procedures Procedures (including critical care time)  Medications Ordered in UC Medications - No data to display  Initial Impression / Assessment and Plan /  UC  Course  I have reviewed the triage vital signs and the nursing notes.  Pertinent labs & imaging results that were available during my care of the patient were reviewed by me and considered in my medical decision making (see chart for details).    Left otitis media.  Afebrile and vital signs are stable.  Treating with amoxicillin .  Tylenol  as needed.  Instructed her to follow-up with her PCP if she is not improving.  Education provided on otitis media.  She agrees to plan of care.  Final Clinical Impressions(s) / UC Diagnoses   Final diagnoses:  Left otitis media, unspecified otitis media type     Discharge Instructions      Take amoxicillin  as directed for ear infection.  Follow-up with your primary care provider if your symptoms are not improving.      ED Prescriptions     Medication Sig Dispense Auth. Provider   amoxicillin  (AMOXIL ) 875 MG tablet Take 1 tablet (875 mg total) by mouth 2 (two) times daily for 10 days. 20 tablet Corlis Burnard DEL, NP      PDMP not reviewed this encounter.    [1]  Social History Tobacco Use   Smoking status: Former    Current packs/day: 0.25    Types: Cigarettes    Passive exposure: Past   Smokeless tobacco: Never   Tobacco comments:    recently quit  Vaping Use   Vaping status: Never Used  Substance Use Topics   Alcohol use: Yes    Comment: socially   Drug use: No     Corlis Burnard DEL, NP 10/19/24 2006  "

## 2024-10-19 NOTE — Discharge Instructions (Addendum)
 Take amoxicillin  as directed for ear infection.  Follow-up with your primary care provider if your symptoms are not improving.

## 2024-10-19 NOTE — ED Triage Notes (Signed)
 Patient to Urgent Care with complaints of left sided ear fullness/ muffled hearing.   Symptoms x2 weeks. Reports she attempted a back flip and symptoms started after.

## 2024-10-28 DIAGNOSIS — B379 Candidiasis, unspecified: Secondary | ICD-10-CM

## 2024-10-28 MED ORDER — FLUCONAZOLE 150 MG PO TABS: 150.0000 mg | ORAL_TABLET | Freq: Once | ORAL | 0 refills | Status: AC

## 2024-11-03 ENCOUNTER — Ambulatory Visit
Admission: EM | Admit: 2024-11-03 | Discharge: 2024-11-03 | Disposition: A | Discharge status: Home / Self Care | Attending: Emergency Medicine | Admitting: Emergency Medicine

## 2024-11-03 DIAGNOSIS — B349 Viral infection, unspecified: Secondary | ICD-10-CM

## 2024-11-03 LAB — POCT RAPID STREP A (OFFICE): Rapid Strep A Screen: NEGATIVE

## 2024-11-03 LAB — POC COVID19/FLU A&B COMBO
Covid Antigen, POC: NEGATIVE
Influenza A Antigen, POC: NEGATIVE
Influenza B Antigen, POC: NEGATIVE

## 2024-11-03 NOTE — ED Provider Notes (Signed)
 " CAY RALPH PELT    CSN: [id number] Arrival date & time: 11/03/24  0804      History   Chief Complaint Chief Complaint  Patient presents with   Cough    HPI Holly Meyer is a 29 y.o. female.  Patient presents with 2-day history of low-grade fever, fatigue, congestion, cough, vomiting, diarrhea.  No ear pain or sore throat.  Tmax 100.  She has been treating her symptoms with DayQuil and cough medication.  She has been able to drink water this morning without emesis.  Patient was seen here on 10/19/2024; diagnosed with left otitis media; treated with amoxicillin .  The history is provided by the patient and medical records.    Past Medical History:  Diagnosis Date   Anemia    Complication of anesthesia    complaints of nausea and vomitting when wisdom teeth were removed   FHx: diabetes mellitus    GERD (gastroesophageal reflux disease)    Headache    History of miscarriage, currently pregnant, first trimester 08/28/2017   Patient desires vaginal birth after cesarean section (VBAC) 08/15/2021   PONV (postoperative nausea and vomiting)    Septate uterus    Symptomatic cholelithiasis 01/2023   Term pregnancy, repeat 01/15/2022   Venous malformation    Right paraspinal    Patient Active Problem List   Diagnosis Date Noted   Anxiety and depression 12/18/2022   Preventative health care 02/01/2017   Allergic rhinitis 02/28/2016   Headache, migraine 08/02/2015   Venous malformation 02/28/2012    Past Surgical History:  Procedure Laterality Date   CESAREAN SECTION N/A 03/25/2018   Procedure: PRIMARY CESAREAN SECTION;  Surgeon: Janit Alm Agent, MD;  Location: [facility];  Service: Obstetrics;  Laterality: N/A;  Female born @ 26     CESAREAN SECTION N/A 01/15/2022   Procedure: CESAREAN SECTION;  Surgeon: Janit Alm Agent, MD;  Location: [facility];  Service: Obstetrics;  Laterality: N/A;   CHOLECYSTECTOMY     DILATION AND EVACUATION N/A 01/07/2017   Procedure:  DILATATION AND EVACUATION;  Surgeon: Alm Agent Janit, MD;  Location: [facility];  Service: Gynecology;  Laterality: N/A;   TONSILLECTOMY Bilateral 05/20/2023   Procedure: TONSILLECTOMY;  Surgeon: Jesus Oliphant, MD;  Location: [facility];  Service: ENT;  Laterality: Bilateral;   WISDOM TOOTH EXTRACTION  10/15/2010    OB History     Gravida  3   Para  2   Term  2   Preterm  0   AB  1   Living  2      SAB  1   IAB  0   Ectopic  0   Multiple  0   Live Births  2            Home Medications    Prior to Admission medications  Medication Sig Start Date End Date Taking? Authorizing Provider  acetaminophen  (TYLENOL ) 500 MG tablet Take 2 tablets (1,000 mg total) by mouth every 6 (six) hours as needed for mild pain. 02/21/23   Desiderio Schanz, MD  FLUoxetine  (PROZAC ) 40 MG capsule Take 1 capsule (40 mg total) by mouth daily. For anxiety 06/25/24   Gretta Comer POUR, NP  ibuprofen  (ADVIL ) 800 MG tablet Take 1 tablet (800 mg total) by mouth every 8 (eight) hours as needed for moderate pain. 02/21/23   Desiderio Schanz, MD  ibuprofen  (ADVIL ) 800 MG tablet Take 1 tablet (800 mg total) by mouth every 8 (eight) hours as needed.  10/06/24   Dove, Myra C, MD  norgestrel -ethinyl estradiol  (LO/OVRAL ) 0.3-30 MG-MCG tablet Take 1 tablet by mouth daily. 10/06/24   Starla Harland BROCKS, MD    Family History Family History  Problem Relation Age of Onset   Migraines Mother    Diabetes Mother    Hypertension Father    Diabetes Father    Alzheimer's disease Maternal Grandmother    Diabetes Paternal Grandmother        family history   Heart failure Paternal Grandfather    Diabetes Paternal Grandfather     Social History Social History[1]   Allergies   Fexofenadine, Citrus, Imitrex  [sumatriptan ], Guaifenesin, Guaifenesin er, Meloxicam, Naproxen, and Tomato   Review of Systems Review of Systems  Constitutional:  Positive for fatigue and fever. Negative for chills.  HENT:  Positive  for congestion, postnasal drip and rhinorrhea. Negative for ear pain and sore throat.   Respiratory:  Positive for cough. Negative for shortness of breath.   Cardiovascular:  Negative for chest pain and palpitations.  Gastrointestinal:  Positive for diarrhea, nausea and vomiting. Negative for abdominal pain.     Physical Exam Triage Vital Signs ED Triage Vitals  Encounter Vitals Group     BP 11/03/24 0811 130/86     Girls Systolic BP Percentile --      Girls Diastolic BP Percentile --      Boys Systolic BP Percentile --      Boys Diastolic BP Percentile --      Pulse Rate 11/03/24 0811 (!) 102     Resp 11/03/24 0811 16     Temp 11/03/24 0811 98.3 F (36.8 C)     Temp Source 11/03/24 0811 Oral     SpO2 11/03/24 0811 98 %     Weight --      Height --      Head Circumference --      Peak Flow --      Pain Score 11/03/24 0809 5     Pain Loc --      Pain Education --      Exclude from Growth Chart --    No data found.  Updated Vital Signs BP 130/86 (BP Location: Right Arm)   Pulse (!) 102   Temp 98.3 F (36.8 C) (Oral)   Resp 16   LMP 11/01/2024 (Exact Date)   SpO2 98%   Visual Acuity Right Eye Distance:   Left Eye Distance:   Bilateral Distance:    Right Eye Near:   Left Eye Near:    Bilateral Near:     Physical Exam Constitutional:      General: She is not in acute distress. HENT:     Right Ear: Tympanic membrane normal.     Left Ear: Tympanic membrane normal.     Nose: Rhinorrhea present.     Mouth/Throat:     Mouth: Mucous membranes are moist.     Pharynx: Oropharynx is clear.  Cardiovascular:     Rate and Rhythm: Normal rate and regular rhythm.     Heart sounds: Normal heart sounds.  Pulmonary:     Effort: Pulmonary effort is normal. No respiratory distress.     Breath sounds: Normal breath sounds.  Abdominal:     General: Bowel sounds are normal.     Palpations: Abdomen is soft.     Tenderness: There is no abdominal tenderness. There is no  guarding or rebound.  Neurological:     Mental Status: She is alert.  UC Treatments / Results  Labs (all labs ordered are listed, but only abnormal results are displayed) Labs Reviewed  POC COVID19/FLU A&B COMBO - Normal  POCT RAPID STREP A (OFFICE) - Normal    EKG   Radiology No results found.  Procedures Procedures (including critical care time)  Medications Ordered in UC Medications - No data to display  Initial Impression / Assessment and Plan / UC Course  I have reviewed the triage vital signs and the nursing notes.  Pertinent labs & imaging results that were available during my care of the patient were reviewed by me and considered in my medical decision making (see chart for details).    Viral illness.  Afebrile and vital signs are stable.  Lungs are clear and O2 sat is 98% on room air.  Rapid strep, COVID and flu negative.  Patient declines antinausea medicine today; she states she has Zofran  at home if she needs it.  Discussed symptomatic treatment including Tylenol  or ibuprofen  as needed for fever or discomfort, plain Mucinex as needed for congestion, rest, hydration.  Instructed patient to follow-up with her PCP.  ED precautions given.  Patient agrees to plan of care.    Final Clinical Impressions(s) / UC Diagnoses   Final diagnoses:  Viral illness     Discharge Instructions      The strep, COVID and flu tests are negative.   Take Tylenol  or ibuprofen  as needed for fever or discomfort.  Take plain Mucinex as needed for congestion.  Rest and keep yourself hydrated.    Follow-up with your primary care provider if your symptoms are not improving.         ED Prescriptions   None    PDMP not reviewed this encounter.    [1]  Social History Tobacco Use   Smoking status: Former    Current packs/day: 0.25    Types: Cigarettes    Passive exposure: Past   Smokeless tobacco: Never   Tobacco comments:    recently quit  Vaping Use   Vaping  status: Never Used  Substance Use Topics   Alcohol use: Yes    Comment: socially   Drug use: No     Corlis Burnard DEL, NP 11/03/24 [phone]  "

## 2024-11-03 NOTE — Discharge Instructions (Addendum)
 The strep, COVID and flu tests are negative.   Take Tylenol or ibuprofen as needed for fever or discomfort.  Take plain Mucinex as needed for congestion.  Rest and keep yourself hydrated.    Follow-up with your primary care provider if your symptoms are not improving.

## 2024-11-03 NOTE — ED Triage Notes (Signed)
 Patient presents to UC for chest, chest tightness with coughing, fatigue, low grade fever x 2 days. Treating symptoms with dayquil, robitussin, cough drops.

## 2024-11-10 ENCOUNTER — Ambulatory Visit

## 2024-11-10 ENCOUNTER — Ambulatory Visit: Admission: RE | Admit: 2024-11-10 | Discharge: 2024-11-10 | Disposition: A | Source: Ambulatory Visit

## 2024-11-10 ENCOUNTER — Ambulatory Visit: Payer: Self-pay

## 2024-11-10 VITALS — BP 114/78 | HR 84 | Temp 98.0°F | Ht 67.0 in | Wt 192.0 lb

## 2024-11-10 DIAGNOSIS — R051 Acute cough: Secondary | ICD-10-CM | POA: Diagnosis not present

## 2024-11-10 MED ORDER — PROMETHAZINE-DM 6.25-15 MG/5ML PO SYRP: 2.5000 mL | ORAL_SOLUTION | Freq: Every evening | ORAL | 0 refills | Status: AC

## 2024-11-10 MED ORDER — PROMETHAZINE-DM 6.25-15 MG/5ML PO SYRP: 5.0000 mL | ORAL_SOLUTION | Freq: Every evening | ORAL | 0 refills | Status: DC

## 2024-11-10 MED ORDER — BENZONATATE 200 MG PO CAPS: 200.0000 mg | ORAL_CAPSULE | Freq: Three times a day (TID) | ORAL | 0 refills | Status: AC | PRN

## 2024-11-10 NOTE — Progress Notes (Unsigned)
" ° °  Subjective:   This visit was conducted in person. The patient gave informed consent to the use of Abridge AI technology to record the contents of the encounter as documented below.   Patient ID: Holly Meyer, female    DOB: [birth year], 29 y.o.   MRN: [id number]   Discussed the use of AI scribe software for clinical note transcription with the patient, who gave verbal consent to proceed.  History of Present Illness   Some chest congestion, no SOB 1/20 - cough congestion, cough keeps sleeping, nasal congestion, Vix vapor and cough drops, hot honey and tea      Review of Systems      Allergies[1]  Medications Ordered Prior to Encounter[2]  BP 114/78 (BP Location: Left Arm, Patient Position: Sitting, Cuff Size: Large)   Pulse 84   Temp 98 F (36.7 C) (Oral)   Ht 5' 7 (1.702 m)   Wt 192 lb (87.1 kg)   LMP 11/01/2024 (Exact Date)   SpO2 98%   BMI 30.07 kg/m   Objective:      Physical Exam          Assessment & Plan:   Assessment and Plan Assessment & Plan    No follow-ups on file.   Tashari Schoenfelder K Griselle Rufer, MD  11/10/24     Contains text generated by Abridge.       [1]  Allergies Allergen Reactions   Fexofenadine Shortness Of Breath and Other (See Comments)    Respiratory Distress (ALLERGY/intolerance)   Citrus Hives   Imitrex  [Sumatriptan ] Other (See Comments)    Symptoms worsened   Guaifenesin Other (See Comments)    Dizziness (intolerance)   Guaifenesin Er Other (See Comments)    Dizziness   Meloxicam Other (See Comments)    Unknown   Naproxen Other (See Comments)    Loss of appiteite   Tomato Rash    Tongue gets sores   [2]  Current Outpatient Medications on File Prior to Visit  Medication Sig Dispense Refill   acetaminophen  (TYLENOL ) 500 MG tablet Take 2 tablets (1,000 mg total) by mouth every 6 (six) hours as needed for mild pain.     FLUoxetine  (PROZAC ) 40 MG capsule Take 1 capsule (40 mg total) by mouth daily. For anxiety 90 capsule  3   ibuprofen  (ADVIL ) 800 MG tablet Take 1 tablet (800 mg total) by mouth every 8 (eight) hours as needed for moderate pain. 60 tablet 1   ibuprofen  (ADVIL ) 800 MG tablet Take 1 tablet (800 mg total) by mouth every 8 (eight) hours as needed. 60 tablet 1   norgestrel -ethinyl estradiol  (LO/OVRAL ) 0.3-30 MG-MCG tablet Take 1 tablet by mouth daily. 84 tablet 5   No current facility-administered medications on file prior to visit.   "

## 2024-11-10 NOTE — Telephone Encounter (Signed)
 FYI Only or Action Required?: FYI only for provider: appointment scheduled on 11/10/24. Patient was last seen in primary care on 06/25/2024 by Gretta Comer POUR, NP.  Called Nurse Triage reporting Cough.  Symptoms began several weeks ago.  Interventions attempted: OTC medications: vicks.  Symptoms are: unchanged.  Triage Disposition: See Physician Within 24 Hours  Patient/caregiver understands and will follow disposition?: Yes    Message from Avram MATSU sent at 11/10/2024  9:20 AM EST  Reason for Triage: sharp pain from neck to shoulder when patient cough. The cough sounds off and have sob with that. Nasal congested.   Reason for Disposition  [1] Continuous (nonstop) coughing interferes with work or school AND [2] no improvement using cough treatment per Care Advice  Answer Assessment - Initial Assessment Questions Scheduled 11/10/24 Advised call back or ED/911 if symptoms occur/worsen: severe diff breathing, chest pain > 5 min, faint. Patient verbalized understanding.   1. ONSET: When did the cough begin?      2 weeks 2. SEVERITY: How bad is the cough today?      Moderate; using Vicks 3. SPUTUM: Describe the color of your sputum (e.g., none, dry cough; clear, white, yellow, green)     no 4. HEMOPTYSIS: Are you coughing up any blood? If Yes, ask: How much? (e.g., flecks, streaks, tablespoons, etc.)     no 5. DIFFICULTY BREATHING: Are you having difficulty breathing? If Yes, ask: How bad is it? (e.g., mild, moderate, severe)      no 6. FEVER: Do you have a fever? If Yes, ask: What is your temperature, how was it measured, and when did it start?     Denies fever chills n/v 7. CARDIAC HISTORY: Do you have any history of heart disease? (e.g., heart attack, congestive heart failure)      no 8. LUNG HISTORY: Do you have any history of lung disease?  (e.g., pulmonary embolus, asthma, emphysema)     no 9. PE RISK FACTORS: Do you have a history of blood clots?  (or: recent major surgery, recent prolonged travel, bedridden)     no 10. OTHER SYMPTOMS: Do you have any other symptoms? (e.g., runny nose, wheezing, chest pain)  Runny nose, chest pain with cough  Denies diff breathing, faint, sore throat  Protocols used: Cough - Acute Productive-A-AH

## 2024-11-10 NOTE — Telephone Encounter (Signed)
 Noted. Agree with nursing triage decision. Appreciate Dr. Charlyn evaluation.

## 2024-11-10 NOTE — Patient Instructions (Addendum)
 Thank you for visiting [facility] today! Here's what we talked about: - START Promethazine  syrup at night only, Tessalon  during the day  - START Afrin 1 spray each nostril twice a day, ONLY USE FOR 3 DAYS

## 2024-11-15 ENCOUNTER — Ambulatory Visit: Payer: Self-pay
